# Patient Record
Sex: Male | Born: 1955 | Race: White | Hispanic: No | Smoking: Current every day smoker
Health system: Southern US, Community
[De-identification: ages and names within clinical notes are randomized; demographics above are authoritative.]

## PROBLEM LIST (undated history)

## (undated) DIAGNOSIS — F32A Depression, unspecified: Secondary | ICD-10-CM

## (undated) DIAGNOSIS — K5790 Diverticulosis of intestine, part unspecified, without perforation or abscess without bleeding: Secondary | ICD-10-CM

## (undated) DIAGNOSIS — I10 Essential (primary) hypertension: Secondary | ICD-10-CM

## (undated) DIAGNOSIS — Z955 Presence of coronary angioplasty implant and graft: Secondary | ICD-10-CM

## (undated) DIAGNOSIS — I219 Acute myocardial infarction, unspecified: Secondary | ICD-10-CM

## (undated) DIAGNOSIS — E785 Hyperlipidemia, unspecified: Secondary | ICD-10-CM

## (undated) DIAGNOSIS — J189 Pneumonia, unspecified organism: Secondary | ICD-10-CM

## (undated) DIAGNOSIS — I739 Peripheral vascular disease, unspecified: Secondary | ICD-10-CM

## (undated) DIAGNOSIS — K2270 Barrett's esophagus without dysplasia: Secondary | ICD-10-CM

## (undated) DIAGNOSIS — M1990 Unspecified osteoarthritis, unspecified site: Secondary | ICD-10-CM

## (undated) DIAGNOSIS — I2510 Atherosclerotic heart disease of native coronary artery without angina pectoris: Secondary | ICD-10-CM

## (undated) DIAGNOSIS — F3181 Bipolar II disorder: Secondary | ICD-10-CM

## (undated) DIAGNOSIS — I2119 ST elevation (STEMI) myocardial infarction involving other coronary artery of inferior wall: Principal | ICD-10-CM

## (undated) DIAGNOSIS — I509 Heart failure, unspecified: Secondary | ICD-10-CM

## (undated) DIAGNOSIS — F329 Major depressive disorder, single episode, unspecified: Secondary | ICD-10-CM

## (undated) DIAGNOSIS — F1010 Alcohol abuse, uncomplicated: Secondary | ICD-10-CM

## (undated) DIAGNOSIS — F419 Anxiety disorder, unspecified: Secondary | ICD-10-CM

## (undated) DIAGNOSIS — K219 Gastro-esophageal reflux disease without esophagitis: Secondary | ICD-10-CM

## (undated) HISTORY — DX: Diverticulosis of intestine, part unspecified, without perforation or abscess without bleeding: K57.90

## (undated) HISTORY — DX: Essential (primary) hypertension: I10

## (undated) HISTORY — DX: Major depressive disorder, single episode, unspecified: F32.9

## (undated) HISTORY — DX: Gastro-esophageal reflux disease without esophagitis: K21.9

## (undated) HISTORY — PX: CORONARY STENT PLACEMENT: SHX1402

## (undated) HISTORY — DX: Hyperlipidemia, unspecified: E78.5

## (undated) HISTORY — PX: CORONARY ANGIOPLASTY: SHX604

## (undated) HISTORY — DX: Anxiety disorder, unspecified: F41.9

## (undated) HISTORY — DX: Heart failure, unspecified: I50.9

## (undated) HISTORY — DX: Pneumonia, unspecified organism: J18.9

## (undated) HISTORY — DX: Alcohol abuse, uncomplicated: F10.10

## (undated) HISTORY — DX: Barrett's esophagus without dysplasia: K22.70

## (undated) HISTORY — DX: Depression, unspecified: F32.A

## (undated) HISTORY — PX: FEMORAL-POPLITEAL BYPASS GRAFT: SHX937

## (undated) HISTORY — PX: CORONARY ARTERY BYPASS GRAFT: SHX141

## (undated) HISTORY — DX: Atherosclerotic heart disease of native coronary artery without angina pectoris: I25.10

## (undated) HISTORY — DX: Unspecified osteoarthritis, unspecified site: M19.90

## (undated) HISTORY — PX: CHOLECYSTECTOMY: SHX55

## (undated) HISTORY — PX: TOE SURGERY: SHX1073

---

## 1998-10-04 ENCOUNTER — Encounter: Payer: Self-pay | Admitting: Rheumatology

## 1998-10-04 ENCOUNTER — Ambulatory Visit (HOSPITAL_COMMUNITY): Admission: RE | Admit: 1998-10-04 | Discharge: 1998-10-04 | Payer: Self-pay | Admitting: Rheumatology

## 2000-04-08 ENCOUNTER — Encounter: Payer: Self-pay | Admitting: Gastroenterology

## 2000-04-08 ENCOUNTER — Ambulatory Visit (HOSPITAL_COMMUNITY): Admission: RE | Admit: 2000-04-08 | Discharge: 2000-04-08 | Payer: Self-pay | Admitting: Gastroenterology

## 2000-05-01 ENCOUNTER — Ambulatory Visit (HOSPITAL_COMMUNITY): Admission: RE | Admit: 2000-05-01 | Discharge: 2000-05-01 | Payer: Self-pay | Admitting: Gastroenterology

## 2000-11-11 ENCOUNTER — Encounter: Payer: Self-pay | Admitting: Gastroenterology

## 2000-11-11 ENCOUNTER — Encounter: Admission: RE | Admit: 2000-11-11 | Discharge: 2000-11-11 | Payer: Self-pay | Admitting: Gastroenterology

## 2000-11-18 ENCOUNTER — Ambulatory Visit (HOSPITAL_COMMUNITY): Admission: RE | Admit: 2000-11-18 | Discharge: 2000-11-18 | Payer: Self-pay | Admitting: Gastroenterology

## 2003-01-01 ENCOUNTER — Inpatient Hospital Stay (HOSPITAL_COMMUNITY): Admission: EM | Admit: 2003-01-01 | Discharge: 2003-01-05 | Payer: Self-pay | Admitting: Emergency Medicine

## 2003-01-02 ENCOUNTER — Encounter: Payer: Self-pay | Admitting: Internal Medicine

## 2003-01-03 ENCOUNTER — Encounter: Payer: Self-pay | Admitting: Internal Medicine

## 2003-03-10 ENCOUNTER — Encounter: Admission: RE | Admit: 2003-03-10 | Discharge: 2003-03-10 | Payer: Self-pay | Admitting: Family Medicine

## 2003-03-10 ENCOUNTER — Encounter: Payer: Self-pay | Admitting: Family Medicine

## 2004-07-22 ENCOUNTER — Emergency Department (HOSPITAL_COMMUNITY): Admission: EM | Admit: 2004-07-22 | Discharge: 2004-07-22 | Payer: Self-pay | Admitting: *Deleted

## 2004-08-16 ENCOUNTER — Encounter: Admission: RE | Admit: 2004-08-16 | Discharge: 2004-08-16 | Payer: Self-pay | Admitting: Family Medicine

## 2004-08-16 ENCOUNTER — Encounter: Payer: Self-pay | Admitting: Gastroenterology

## 2004-08-16 ENCOUNTER — Ambulatory Visit (HOSPITAL_COMMUNITY): Admission: RE | Admit: 2004-08-16 | Discharge: 2004-08-16 | Payer: Self-pay | Admitting: Dentistry

## 2004-08-24 ENCOUNTER — Ambulatory Visit (HOSPITAL_COMMUNITY): Admission: RE | Admit: 2004-08-24 | Discharge: 2004-08-24 | Payer: Self-pay | Admitting: Orthopaedic Surgery

## 2004-09-14 ENCOUNTER — Encounter: Admission: RE | Admit: 2004-09-14 | Discharge: 2004-11-09 | Payer: Self-pay | Admitting: Orthopaedic Surgery

## 2004-09-27 ENCOUNTER — Ambulatory Visit (HOSPITAL_COMMUNITY): Admission: RE | Admit: 2004-09-27 | Discharge: 2004-09-27 | Payer: Self-pay | Admitting: Gastroenterology

## 2004-09-27 ENCOUNTER — Encounter: Payer: Self-pay | Admitting: Gastroenterology

## 2004-10-24 ENCOUNTER — Encounter
Admission: RE | Admit: 2004-10-24 | Discharge: 2004-12-26 | Payer: Self-pay | Admitting: Physical Medicine and Rehabilitation

## 2004-10-26 ENCOUNTER — Ambulatory Visit: Payer: Self-pay | Admitting: Physical Medicine and Rehabilitation

## 2004-11-08 ENCOUNTER — Ambulatory Visit: Payer: Self-pay | Admitting: Sports Medicine

## 2004-11-21 ENCOUNTER — Ambulatory Visit: Payer: Self-pay | Admitting: Family Medicine

## 2004-12-01 ENCOUNTER — Observation Stay (HOSPITAL_COMMUNITY): Admission: RE | Admit: 2004-12-01 | Discharge: 2004-12-02 | Payer: Self-pay | Admitting: Orthopaedic Surgery

## 2005-01-02 ENCOUNTER — Ambulatory Visit: Payer: Self-pay | Admitting: Family Medicine

## 2005-01-18 ENCOUNTER — Encounter: Admission: RE | Admit: 2005-01-18 | Discharge: 2005-02-20 | Payer: Self-pay | Admitting: Orthopaedic Surgery

## 2005-01-22 ENCOUNTER — Ambulatory Visit: Payer: Self-pay

## 2005-01-26 ENCOUNTER — Ambulatory Visit: Payer: Self-pay | Admitting: Family Medicine

## 2005-01-30 ENCOUNTER — Ambulatory Visit: Payer: Self-pay | Admitting: Family Medicine

## 2005-02-07 ENCOUNTER — Emergency Department (HOSPITAL_COMMUNITY): Admission: EM | Admit: 2005-02-07 | Discharge: 2005-02-08 | Payer: Self-pay | Admitting: Emergency Medicine

## 2005-02-16 ENCOUNTER — Ambulatory Visit (HOSPITAL_COMMUNITY): Admission: RE | Admit: 2005-02-16 | Discharge: 2005-02-16 | Payer: Self-pay | Admitting: Orthopaedic Surgery

## 2005-02-18 ENCOUNTER — Emergency Department (HOSPITAL_COMMUNITY): Admission: EM | Admit: 2005-02-18 | Discharge: 2005-02-18 | Payer: Self-pay | Admitting: Emergency Medicine

## 2005-02-20 ENCOUNTER — Ambulatory Visit: Payer: Self-pay | Admitting: Family Medicine

## 2005-03-21 ENCOUNTER — Ambulatory Visit (HOSPITAL_BASED_OUTPATIENT_CLINIC_OR_DEPARTMENT_OTHER): Admission: RE | Admit: 2005-03-21 | Discharge: 2005-03-21 | Payer: Self-pay | Admitting: Orthopaedic Surgery

## 2005-05-04 ENCOUNTER — Ambulatory Visit: Payer: Self-pay | Admitting: Family Medicine

## 2005-05-18 ENCOUNTER — Ambulatory Visit: Payer: Self-pay | Admitting: Family Medicine

## 2007-02-20 DIAGNOSIS — E785 Hyperlipidemia, unspecified: Secondary | ICD-10-CM

## 2007-02-20 DIAGNOSIS — B1710 Acute hepatitis C without hepatic coma: Secondary | ICD-10-CM

## 2007-02-20 DIAGNOSIS — F17200 Nicotine dependence, unspecified, uncomplicated: Secondary | ICD-10-CM

## 2007-02-20 DIAGNOSIS — K449 Diaphragmatic hernia without obstruction or gangrene: Secondary | ICD-10-CM

## 2007-02-20 DIAGNOSIS — M129 Arthropathy, unspecified: Secondary | ICD-10-CM | POA: Insufficient documentation

## 2007-02-20 DIAGNOSIS — K922 Gastrointestinal hemorrhage, unspecified: Secondary | ICD-10-CM | POA: Insufficient documentation

## 2007-02-20 DIAGNOSIS — M109 Gout, unspecified: Secondary | ICD-10-CM

## 2008-04-06 ENCOUNTER — Inpatient Hospital Stay (HOSPITAL_COMMUNITY): Admission: EM | Admit: 2008-04-06 | Discharge: 2008-04-17 | Payer: Self-pay | Admitting: Emergency Medicine

## 2008-04-07 ENCOUNTER — Ambulatory Visit: Payer: Self-pay | Admitting: Cardiothoracic Surgery

## 2008-04-07 ENCOUNTER — Encounter: Payer: Self-pay | Admitting: Cardiothoracic Surgery

## 2008-05-04 ENCOUNTER — Encounter: Admission: RE | Admit: 2008-05-04 | Discharge: 2008-05-04 | Payer: Self-pay | Admitting: Cardiothoracic Surgery

## 2008-05-04 ENCOUNTER — Ambulatory Visit: Payer: Self-pay | Admitting: Cardiothoracic Surgery

## 2008-05-24 ENCOUNTER — Ambulatory Visit: Payer: Self-pay | Admitting: Cardiothoracic Surgery

## 2008-07-08 ENCOUNTER — Encounter (HOSPITAL_COMMUNITY): Admission: RE | Admit: 2008-07-08 | Discharge: 2008-09-16 | Payer: Self-pay | Admitting: Cardiology

## 2008-10-01 ENCOUNTER — Observation Stay (HOSPITAL_COMMUNITY): Admission: EM | Admit: 2008-10-01 | Discharge: 2008-10-02 | Payer: Self-pay | Admitting: Emergency Medicine

## 2009-06-06 ENCOUNTER — Emergency Department (HOSPITAL_COMMUNITY): Admission: EM | Admit: 2009-06-06 | Discharge: 2009-06-06 | Payer: Self-pay | Admitting: Emergency Medicine

## 2009-06-16 ENCOUNTER — Emergency Department (HOSPITAL_COMMUNITY): Admission: EM | Admit: 2009-06-16 | Discharge: 2009-06-16 | Payer: Self-pay | Admitting: Emergency Medicine

## 2009-09-09 ENCOUNTER — Emergency Department (HOSPITAL_COMMUNITY): Admission: EM | Admit: 2009-09-09 | Discharge: 2009-09-09 | Payer: Self-pay | Admitting: Emergency Medicine

## 2009-09-20 ENCOUNTER — Ambulatory Visit: Payer: Self-pay | Admitting: Psychiatry

## 2009-09-20 ENCOUNTER — Inpatient Hospital Stay (HOSPITAL_COMMUNITY): Admission: AD | Admit: 2009-09-20 | Discharge: 2009-09-26 | Payer: Self-pay | Admitting: Psychiatry

## 2009-09-20 ENCOUNTER — Emergency Department (HOSPITAL_COMMUNITY): Admission: EM | Admit: 2009-09-20 | Discharge: 2009-09-20 | Payer: Self-pay | Admitting: Emergency Medicine

## 2009-12-13 IMAGING — CR DG CHEST 1V PORT
1 series · 1 of 1 positions shown · non-contrast
Comparison: 04/08/2008 [DATE] p.m..

CLINICAL DATA: Status post CABG.

PORTABLE CHEST - 1 VIEW

[AP]
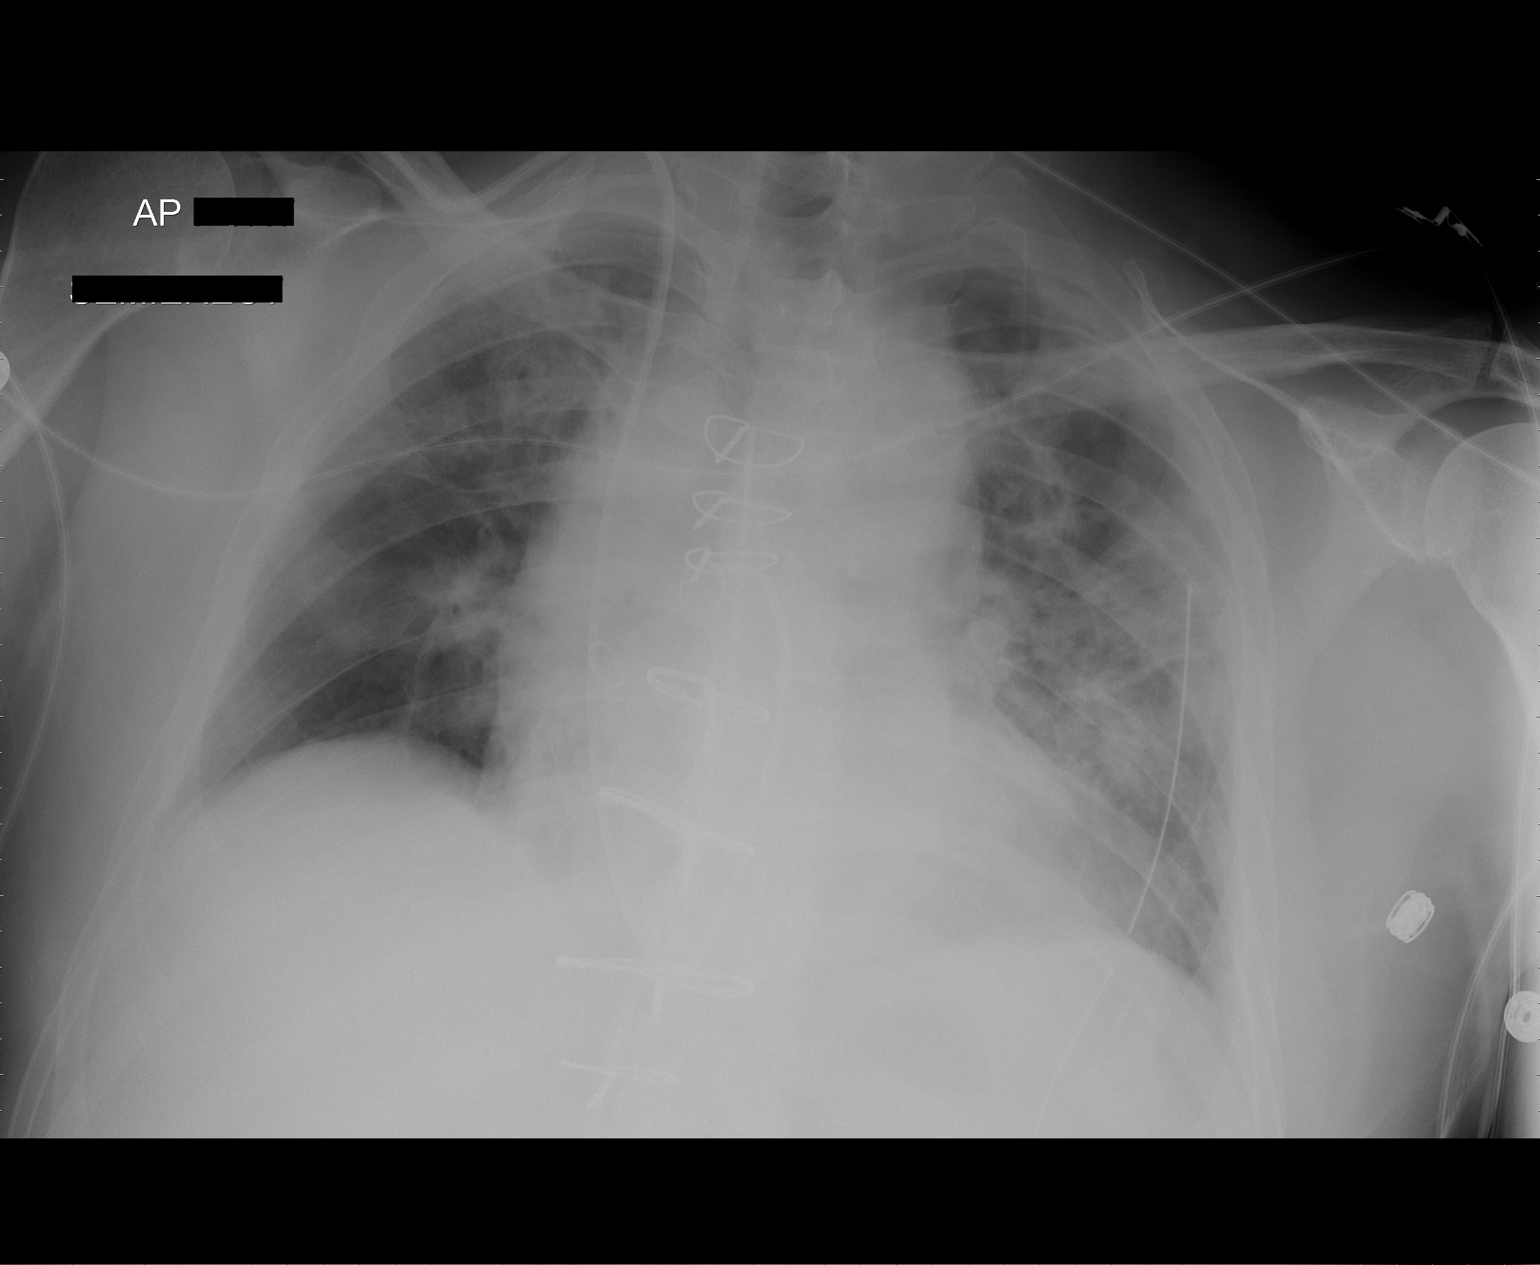

[1 of 1 positions shown; findings below may reference images not displayed]

FINDINGS: Endotracheal tube and nasogastric tube have been removed.
Swan-Ganz catheter has been retracted with the tip at the level of
the main pulmonary artery - right main pulmonary artery junction.
Single left-sided chest tube remains in place.  The possibility of
an anteriorly located left-sided pneumothorax was previously
raised, it is possible this remains although atelectasis may
contribute to this appearance.  Interval development of patchy
changes peripheral aspect left mid lung extending towards the left
hilum.  This may represent atelectasis although infiltrate cannot
be excluded..  Pulmonary vascular congestion.  Cardiomegaly.
Torturuous aorta.
IMPRESSION: Endotracheal tube and nasogastric tube have been removed with
repositioning of Swan-Ganz catheter.

Interval development of patchy consolidation left midlung zone.
This may represent atelectasis and / or infiltrate.

The previously questioned anterior left pneumothorax may remain.
Attention to this region on follow-up.

## 2010-03-28 ENCOUNTER — Inpatient Hospital Stay (HOSPITAL_COMMUNITY): Admission: EM | Admit: 2010-03-28 | Discharge: 2010-03-30 | Payer: Self-pay | Admitting: Emergency Medicine

## 2011-03-14 LAB — CBC
Hemoglobin: 14.1 g/dL (ref 13.0–17.0)
MCHC: 33.7 g/dL (ref 30.0–36.0)
MCHC: 34.6 g/dL (ref 30.0–36.0)
MCV: 86.2 fL (ref 78.0–100.0)
MCV: 87.8 fL (ref 78.0–100.0)
RBC: 3.85 MIL/uL — ABNORMAL LOW (ref 4.22–5.81)
RBC: 4.72 MIL/uL (ref 4.22–5.81)
RDW: 16.9 % — ABNORMAL HIGH (ref 11.5–15.5)
WBC: 9.7 10*3/uL (ref 4.0–10.5)

## 2011-03-14 LAB — URINALYSIS, ROUTINE W REFLEX MICROSCOPIC
Bilirubin Urine: NEGATIVE
Ketones, ur: NEGATIVE mg/dL
Nitrite: NEGATIVE
Protein, ur: NEGATIVE mg/dL
Urobilinogen, UA: 0.2 mg/dL (ref 0.0–1.0)

## 2011-03-14 LAB — COMPREHENSIVE METABOLIC PANEL
ALT: 14 U/L (ref 0–53)
ALT: 18 U/L (ref 0–53)
AST: 14 U/L (ref 0–37)
AST: 18 U/L (ref 0–37)
CO2: 22 mEq/L (ref 19–32)
CO2: 27 mEq/L (ref 19–32)
Calcium: 8.3 mg/dL — ABNORMAL LOW (ref 8.4–10.5)
Calcium: 9.1 mg/dL (ref 8.4–10.5)
Chloride: 103 mEq/L (ref 96–112)
Creatinine, Ser: 0.82 mg/dL (ref 0.4–1.5)
GFR calc Af Amer: >60 mL/min (ref >60)
GFR calc non Af Amer: >60 mL/min (ref >60)
GFR calc non Af Amer: >60 mL/min (ref >60)
Glucose, Bld: 136 mg/dL — ABNORMAL HIGH (ref 70–99)
Sodium: 137 mEq/L (ref 135–145)
Sodium: 139 mEq/L (ref 135–145)
Total Bilirubin: 0.8 mg/dL (ref 0.3–1.2)
Total Protein: 5.9 g/dL — ABNORMAL LOW (ref 6.0–8.3)

## 2011-03-14 LAB — PROTIME-INR: INR: 1.06 (ref 0.00–1.49)

## 2011-03-14 LAB — DIFFERENTIAL
Basophils Absolute: 0 10*3/uL (ref 0.0–0.1)
Basophils Relative: 0 % (ref 0–1)
Eosinophils Absolute: 0.1 10*3/uL (ref 0.0–0.7)
Eosinophils Relative: 1 % (ref 0–5)
Lymphs Abs: 1.3 10*3/uL (ref 0.7–4.0)
Neutrophils Relative %: 81 % — ABNORMAL HIGH (ref 43–77)

## 2011-03-14 LAB — LIPASE, BLOOD
Lipase: 21 U/L (ref 11–59)
Lipase: 24 U/L (ref 11–59)

## 2011-03-14 LAB — GLUCOSE, CAPILLARY: Glucose-Capillary: 102 mg/dL — ABNORMAL HIGH (ref 70–99)

## 2011-03-14 LAB — APTT: aPTT: 31 seconds (ref 24–37)

## 2011-03-30 LAB — URINALYSIS, ROUTINE W REFLEX MICROSCOPIC
Protein, ur: NEGATIVE mg/dL
Specific Gravity, Urine: 1.014 (ref 1.005–1.030)
Urobilinogen, UA: 0.2 mg/dL (ref 0.0–1.0)

## 2011-03-30 LAB — CBC
Platelets: 174 10*3/uL (ref 150–400)
RBC: 4.49 MIL/uL (ref 4.22–5.81)
WBC: 9.6 10*3/uL (ref 4.0–10.5)

## 2011-03-30 LAB — DIFFERENTIAL
Eosinophils Absolute: 0 10*3/uL (ref 0.0–0.7)
Lymphocytes Relative: 8 % — ABNORMAL LOW (ref 12–46)
Lymphs Abs: 0.8 10*3/uL (ref 0.7–4.0)
Neutro Abs: 8.5 10*3/uL — ABNORMAL HIGH (ref 1.7–7.7)
Neutrophils Relative %: 89 % — ABNORMAL HIGH (ref 43–77)

## 2011-03-30 LAB — BASIC METABOLIC PANEL
BUN: 12 mg/dL (ref 6–23)
Calcium: 9 mg/dL (ref 8.4–10.5)
Creatinine, Ser: 0.65 mg/dL (ref 0.4–1.5)
GFR calc Af Amer: >60 mL/min (ref >60)
GFR calc non Af Amer: >60 mL/min (ref >60)

## 2011-03-30 LAB — ETHANOL: Alcohol, Ethyl (B): <5 mg/dL (ref 0–10)

## 2011-03-30 LAB — RAPID URINE DRUG SCREEN, HOSP PERFORMED
Amphetamines: NOT DETECTED
Barbiturates: NOT DETECTED
Tetrahydrocannabinol: POSITIVE — AB

## 2011-03-30 LAB — HEPATIC FUNCTION PANEL
AST: 16 U/L (ref 0–37)
Albumin: 3.3 g/dL — ABNORMAL LOW (ref 3.5–5.2)
Alkaline Phosphatase: 81 U/L (ref 39–117)
Total Protein: 5.9 g/dL — ABNORMAL LOW (ref 6.0–8.3)

## 2011-04-02 LAB — CBC
HCT: 33.8 % — ABNORMAL LOW (ref 39.0–52.0)
HCT: 38.6 % — ABNORMAL LOW (ref 39.0–52.0)
Hemoglobin: 11.5 g/dL — ABNORMAL LOW (ref 13.0–17.0)
Hemoglobin: 13.2 g/dL (ref 13.0–17.0)
MCV: 84.6 fL (ref 78.0–100.0)
Platelets: 166 10*3/uL (ref 150–400)
RBC: 4.57 MIL/uL (ref 4.22–5.81)
RDW: 16.2 % — ABNORMAL HIGH (ref 11.5–15.5)
WBC: 11.4 10*3/uL — ABNORMAL HIGH (ref 4.0–10.5)

## 2011-04-02 LAB — DIFFERENTIAL
Eosinophils Absolute: 0.1 10*3/uL (ref 0.0–0.7)
Eosinophils Relative: 1 % (ref 0–5)
Eosinophils Relative: 1 % (ref 0–5)
Lymphocytes Relative: 18 % (ref 12–46)
Lymphocytes Relative: 8 % — ABNORMAL LOW (ref 12–46)
Lymphs Abs: 1.4 10*3/uL (ref 0.7–4.0)
Lymphs Abs: 0.9 10*3/uL (ref 0.7–4.0)
Monocytes Absolute: 0.6 10*3/uL (ref 0.1–1.0)
Monocytes Absolute: 0.5 10*3/uL (ref 0.1–1.0)
Monocytes Relative: 4 % (ref 3–12)

## 2011-04-02 LAB — COMPREHENSIVE METABOLIC PANEL
ALT: 15 U/L (ref 0–53)
AST: 16 U/L (ref 0–37)
Albumin: 3.5 g/dL (ref 3.5–5.2)
CO2: 23 mEq/L (ref 19–32)
Calcium: 8.6 mg/dL (ref 8.4–10.5)
Chloride: 114 mEq/L — ABNORMAL HIGH (ref 96–112)
Creatinine, Ser: 0.78 mg/dL (ref 0.4–1.5)
GFR calc Af Amer: >60 mL/min (ref >60)
Sodium: 146 mEq/L — ABNORMAL HIGH (ref 135–145)
Total Bilirubin: 0.4 mg/dL (ref 0.3–1.2)

## 2011-04-02 LAB — ETHANOL: Alcohol, Ethyl (B): 17 mg/dL — ABNORMAL HIGH (ref 0–10)

## 2011-04-02 LAB — BASIC METABOLIC PANEL
Chloride: 105 mEq/L (ref 96–112)
GFR calc Af Amer: >60 mL/min (ref >60)
GFR calc non Af Amer: >60 mL/min (ref >60)
Potassium: 4.3 mEq/L (ref 3.5–5.1)
Sodium: 137 mEq/L (ref 135–145)

## 2011-04-02 LAB — POCT CARDIAC MARKERS
CKMB, poc: 1.5 ng/mL (ref 1.0–8.0)
Troponin i, poc: <0.05 ng/mL (ref 0.00–0.09)

## 2011-05-08 NOTE — Discharge Summary (Signed)
[name], [name]                 ACCOUNT NO.:  [id number]   MEDICAL RECORD NO.:  [id number]           PATIENT TYPE:   LOCATION:                                 FACILITY:   PHYSICIAN:  Sheliah Plane, MD    DATE OF BIRTH:  1956-06-05   DATE OF ADMISSION:  DATE OF DISCHARGE:                               DISCHARGE SUMMARY   FINAL DIAGNOSES:  1. Coronary occlusive disease with unstable angina.  2. Enzyme positive subendocardial and myocardial infarction.   IN-HOSPITAL DIAGNOSES:  1. Acute blood loss anemia postoperatively.  2. Volume overload postoperatively.  3. Postoperative respiratory failure/acute respiratory distress      syndrome.   SECONDARY DIAGNOSES:  1. History of progressive peripheral vascular disease with limiting      claudication to approximately 50 feet.  2. Status post rotator cuff x2.  3. History of gout.  4. Extensive debridement of shoulder.  5. Gastroesophageal reflux disease.   IN-HOSPITAL OPERATIONS AND PROCEDURES:  1. Cardiac catheterization.  2. Coronary artery bypass grafting x4 using a left internal mammary to      left anterior descending coronary artery, left radial artery to      first obtuse marginal, reverse saphenous vein graft to distal      circumflex, reverse saphenous vein graft to totally occluded      posterior descending coronary artery.  3. Open left radial artery harvest done.  4. Right endovein harvest of the thigh done.   HISTORY AND PHYSICAL AND HOSPITAL COURSE:  The patient is a 55 year old  male who presents with acute substernal chest pain radiating to the arm  with positive cardiac enzymes.  He was stabilized medically and  underwent cardiac catheterization by Dr. Allyson Sabal.  This revealed an acute  total occlusion of the distal right coronary artery with faint  collateral filling and the posterior descending from the left system.  In addition, he had 80%-90% left anterior descending obstruction and  large first obtuse  marginal with 80% stenosis.  Overall, left  ventricular function was depressed with evidence of akinesis in the  inferior wall.  Coronary artery bypass grafting was recommended.  The  patient was seen and evaluated by Dr. Tyrone Sage.  Dr. Tyrone Sage saw and  evaluated the patient.  Discussed with the patient undergoing coronary  artery bypass grafting.  He discussed risks and benefits with the  patient.  The patient acknowledged understanding and agreed to proceed.  Surgery was scheduled for April 08, 2008.  Preoperatively, the patient  did have a bilateral carotid duplex ultrasound done showing no evidence  of ICA stenosis.  He also had preoperative ABIs done showing on the  right to be 0.54 and on the left to be 0.63.  The patient remained  stable preoperatively.  For details of the patient's past medical  history and physical exam, please see dictated H and P.   The patient was taken to the operating room on April 08, 2008, where he  underwent coronary artery bypass grafting x4 using a left internal  mammary artery to left anterior descending coronary artery, left  radial  artery to first obtuse marginal, reverse saphenous vein graft to distal  circumflex, reverse saphenous vein graft totally occluded posterior  descending coronary artery.  Right endovein harvesting was done as well  as left open radial artery harvest.  The patient tolerated this  procedure well and transferred to the intensive care unit in stable  condition.  Postoperatively, the patient was noted to be hemodynamically  stable.  He was extubated the evening of surgery.  Post extubation, the  patient was noted to be alert and oriented x4 and neuro intact.  He was  extubated 6 L of nasal cannula.  Postop day #1, chest x-ray done showed  bilateral congestion.  The patient was having decreased O2 sats and was  placed on BiPAP.  O2 sat stabilized.  Repeat chest x-ray done on postop  day #2 showed ARDS pattern.  The patient  remained on BiPAP, was working  in pulmonary toilet.  Over the next several days, this was improving and  the patient was able to be weaned off BiPAP and placed back on nasal  cannula.  He continued to use incentive spirometer.  The patient was  able to be weaned off oxygen, satting greater than 90% on room air.  The  patient's chest tubes were removed in the normal fashion.  Postoperatively, the patient's heart rate was noted to be in normal  sinus rhythm.  He was able to be weaned off all drips.  Swan-Ganz  catheter DC in normal fashion.  The patient's heart rate and blood  pressure remained stable and he was started on low-dose beta-blocker as  well as ACE inhibitor.  The patient has tolerated this well.  Prior to  discharge home, the patient was noted to be in normal sinus rhythm.  Postoperatively, the patient did develop acute blood loss anemia.  His  hemoglobin and hematocrit dropped to 7.5 and 21.7 on postop day #7.  One  unit packed red blood cells was ordered.  The patient was also started  on p.o. iron.  Repeat hemoglobin and hematocrit improved appropriately  following transfusion.  Postop day was 8.5 and 23.9.  Plan to recheck H  and H in the a.m. prior to discharge.  Postoperatively, the patient also  had some volume overload.  He required diuretics.  Daily weights were  obtained.  These were improved prior to discharge.  Cardiac rehab was  working with the patient postoperatively.  He was out of bed and  ambulating well without difficulty.  The patient was progressing well  prior to discharge home.  Postoperatively, the patient was tolerating  diet well.  No nausea or vomiting noted.   Postop day #8, April 16, 2008, the patient was found to be afebrile.  Heart rate and blood pressure stable.  He was satting greater than 90%  on room air.  Labs showed a white count 7.6, hemoglobin of 8.4,  hematocrit 23.9, and platelet count 281.  Sodium of 136, potassium 4.1,  chloride of  106, bicarb of 24, BUN of 15, creatinine 0.85, and glucose  of 107.  The patient was noted to be in normal sinus rhythm.  Pulmonary  status stable.  All incisions were clean, dry, and intact and healing  well.  The patient is tentatively ready for discharge home in the a.m.,  pending.  He remained stable.   FOLLOW-UP APPOINTMENTS:  Follow-up appointment has been arranged with  Dr. Tyrone Sage for May 06, 2008 3:00 p.m.  The patient will  need to obtain  PMI chest x-ray 30 minutes prior to this appointment.  The patient will  need to follow up Dr. Allyson Sabal in 2 weeks.  He will need to contact Dr.  Hazle Coca office to make these arrangements.   ACTIVITY:  The patient is instructed to not driving to released to do  so, no lifting over 10 pounds.  The patient is told to ambulate 3-4  times per day, progress as tolerating, and to begin breathing exercises.   INCISIONAL CARE:  The patient is told to shower, washing his incisions  using soap and water.  He is to contact the office if he develops any  drainage or opening from any of these incision sites.   DIET:  The patient's diet to be low fat and low salt.   DISCHARGE MEDICATIONS:  1. Colchicine 0.6 mg daily.  2. Zocor 40 mg at night.  3. Imdur 30 mg daily.  4. Guaifenesin 600 mg b.i.d.  5. Aspirin 325 mg daily.  6. Lopressor 25 mg b.i.d.  7. Folic acid 1 mg daily.  8. Altace 2.5 mg at night.  9. Oxycodone 5 mg 1-2 tablets q.4 h. p.r.n.  10.Gemfibrozil 600 mg b.i.d.  11.Prilosec 20 mg daily.  12.Allopurinol 450 mg daily.  13.Nortriptyline 150 mg at night.  14.Calcium supplement daily.      [name], [place]      Sheliah Plane, MD  Electronically Signed    KMD/MEDQ  D:  04/16/2008  T:  04/17/2008  Job:  [id number]   cc:   Nanetta Batty, M.D.

## 2011-05-08 NOTE — Cardiovascular Report (Signed)
NAME:  [name], RECORE NO.:  [id number]   MEDICAL RECORD NO.:  [id number]          PATIENT TYPE:  INP   LOCATION:  2908                         FACILITY:  [facility]   PHYSICIAN:  Nanetta Batty, M.D.   DATE OF BIRTH:  02/01/1956   DATE OF PROCEDURE:  DATE OF DISCHARGE:                            CARDIAC CATHETERIZATION   [name] is a 55 year old single white male who lives with his parents.  He has a history of tobacco abuse, hyperlipidemia and post-traumatic  stress syndrome.  He was admitted on April 06, 2008, with chest pain and  nonspecific ST and T-wave changes.  He had a non ST-segment elevation MI  with positive enzymes.  He was pain free on IV heparin and nitro, as  well as Integrilin.  He presents now for diagnostic coronary  arteriography to define his anatomy and rule out ischemic etiology.   DESCRIPTION OF PROCEDURE:  The patient was brought to second floor [facility] in the postabsorptive state.  He was premedicated  with p.o. Valium.  His right groin was prepped and shaved in the usual  sterile fashion.  1% Xylocaine was used for local anesthesia.  A 6-  French sheath was inserted into the right femoral artery using standard  Seldinger technique.  A 6-French right and left Judkins diagnostic  catheters along with 6-French pigtail catheter were used for selective  coronary angiography, left ventriculography, selective right and left  internal mammary artery angiography and distal abdominal aortography.  Visipaque dye was used for the entirety of the case.  Retrograde aortic,  left ventricular blood pressures were recorded.   HEMODYNAMICS:  1. Aortic systolic pressure 111, diastolic 64.  2. Left ventricular systolic pressure 111, end-diastolic pressure 16.  3. Selective coronary angiography      a.     Left main normal.      b.     LAD; LAD wrapped at the apex.  It gave off two proximal       diagonal branches.  There was a 90%  fairly focal LAD lesion at the       takeoff of the second diagonal branch.      c.     Circumflex; this was a nondominant vessel.  There was 50%       proximal hypodense stenosis.  The circumflex then gave off a       moderate-sized first marginal branch which had a 75-80% hypodense       stenosis beginning at its origin proximally.  The continuation of       the AV groove circumflex had a long 80-90% stenosis.      d.     Right coronary artery; dominant vessel occluded at the genu       of the vessel.  There were grade 2 to 3 left-to-right collaterals       noted. V.  Left ventriculography; RAO left ventriculogram was       performed using 25 mL of Visipaque dye at 12 mL per second.  The  overall LVEF was estimated at approximately 40% with mild-to-       moderate global hypokinesia and moderate inferobasal hypokinesia.       Right and left internal mammary arteries; these vessels were       subselectively visualized and were widely patent.  They are both       suitable for coronary artery bypass grafting if necessary.  Distal       abdominal aortography; distal abdominal aortogram was performed       using 20 mL of Visipaque dye at 20 mL per second.  There was an       80% proximal right renal artery stenosis.  The infrarenal       abdominal aorta and iliac bifurcation were free of significant       atherosclerotic changes.   IMPRESSION:  [name] has three-vessel disease with an occluded right  which is probably his infarct-related vessel, left-to-right collaterals  and high-grade left anterior descending and circumflex disease.  He has  moderate left ventricular dysfunction.  Both internal mammary arteries  were patent.  He is a good candidate for coronary artery  bypass grafting using bilateral I &Es and possibly radial artery  conduit.  An ACT was measured at 243.  A sheath was then secured in  place.  The patient left the lab in stable condition.  Sheaths will be  removed  once the ACT falls below 170.  Heparin will be restarted in 4  hours.  We will continue Integrilin.  TCTF has been consulted.      Nanetta Batty, M.D.  Electronically Signed     JB/MEDQ  D:  04/07/2008  T:  04/08/2008  Job:  [id number]   cc:   [facility]  [facility]  Second Floor [facility] Cardiac Cath Lab

## 2011-05-08 NOTE — Assessment & Plan Note (Signed)
OFFICE VISIT   [name], [name]  DOB:  1956/02/28                                        May 24, 2008  CHART #:  [id number]   HISTORY:  [name] returns for his second postoperative visit following  his coronary surgical revascularization.  He underwent a coronary artery  bypass grafting x4 with a left internal mammary artery to the LAD, a  left radial artery to the first obtuse marginal, and a saphenous vein  graft to the distal circumflex and a saphenous vein graft to the  posterior descending.  He continues to feel improved with time.  He has  ongoing chronic disabilities related to gout with fits, confounding  issues in his joints, limiting his mobility.  Additionally, he has some  peripheral arterial disease with limiting claudication.  He continues to  smoke, although he admits that he has significantly reduced it to  approximately five cigarettes per day.  His primary complaint on today's  date is left radial artery harvest site incision which is spitting  suture material from the wound.   PHYSICAL EXAMINATION:  VITAL SIGNS:  Blood pressure 115/79, pulse 94,  respirations 18, oxygen saturation is 97% on room air.  GENERAL:  This is a well-developed adult male in no acute distress.  PULMONARY EXAMINATION:  Reveals clear breath sounds throughout.  CARDIAC EXAMINATION:  Regular rate and rhythm without murmurs, gallops  or rubs.  ABDOMEN:  Soft.  EXTREMITY EXAMINATION:  No edema incisions.  His left radial artery harvest site has some evidence of Vicryl material  in the skin edges.  There is minimal associated erythema.  There is no  obvious purulence or cellulitis.  There is no lymphangitis associated  with this.  The hand itself has no pain or numbness.   BRIEF PROCEDURE NOTE:  After Betadine prep, I debrided small amounts of  Vicryl suture material from the wound.  He tolerated this well.  I  redressed the incisions with bacitracin and Band-Aids.   ASSESSMENT:  Overall, [name] is making adequate progress in his  recovery from his coronary surgical revascularization.  I spent several  minutes on smoking cessation discussion and its advantages in his long-  term health outlook.  He understands this.  He is going to continue to  attempt to quit, and currently, he is using the patch.  I also discussed  his other comorbidities and his long-term need for reducing his risk  factors and improving his overall health status by general practices of  healthy diet and exercise.  He is to continue to follow up with his  cardiologist and family medical care that he receives at the [place] in a  routine manner.  From the surgical viewpoint, we can see him again on a  p.r.n. basis if he has continued difficulties with this or any other  surgically-related issues.  Otherwise, at this time, he does not need to  have ongoing office surgical management.   [name], P.A.-C.   [name]  D:  05/24/2008  T:  05/24/2008  Job:  [id number]   cc:   Stanley Plane, MD  [name], M.D.

## 2011-05-08 NOTE — Assessment & Plan Note (Signed)
OFFICE VISIT   TANAY, MISURACA  DOB:  08/04/1956                                        May 04, 2008  CHART #:  [id number]   REASON FOR OFFICE VISIT:  3-week followup status post CABG x4.   HISTORY OF PRESENTING ILLNESS:  This is a 55 year old Caucasian male who  presented to [facility] with substernal chest pain radiating to his arm.  He had positive cardiac enzymes.  Cardiac catheterization revealed  multivessel coronary artery disease, and he underwent CABG x4 (LIMA to  LAD, left radial to OM1, reverse saphenous vein grafts to distal  circumflex, reverse saphenous vein graft to totally occluded PDA) by Dr.  Tyrone Sage on April 08, 2008.  Overall, the patient has been progressing  well since being discharged home from the hospital.  His only complaint  is occasional dizziness.  He denies any shortness of breath, chest pain,  fever, chills or night sweats.  His appetite is continuing to improve,  and he takes over walks daily.   PHYSICAL EXAMINATION:  General:  This is a 55-year-lld Caucasian male  who is in no acute distress, who is alert, oriented, and cooperative.  Vital signs reveal BP to be 98/69, heart rate 84, respirations 18, O2  sat 98% on room air.  Cardiovascular:  Regular rate and rhythm.  S1, S2,  without any murmurs, gallops or rubs.  Pulmonary:  Lungs are clear to  auscultation bilaterally.  No rales, wheezes, or rhonchi.  Extremities:  No cyanosis, clubbing, or edema.  The right lower extremity wound is  clean and dry.  The sternal wound is clean, dry.  No sternal instability  noted.  The wound on the left forearm was clean and dry.  There were  multiple scabs; some of these were removed.  A small portion of this  wound closest to the antecubital fossa,  skin edges were slightly  separated.  There was no erythema, no discharge.  His motor and sensory  as well as pulses were intact.   Chest x-ray done today was essentially clear, showed no  pneumothorax,  effusion, or infiltrate.   IMPRESSION AND PLAN:  Overall, Mr. Hoare is progressing well.  He was  encourage to use his incentive spirometer and increase the frequency of  his walks daily.  He was also instructed he may drive in about one more  week, but that he is not to lift more than 10 pounds yet for several  more weeks.  Regarding his hypotension in the office today, he was  instructed to take his blood pressure this evening, and if his systolic  blood pressure was less than 100 he was not to take his Lopressor  tonight.  He does have an appointment see his cardiologist on Thursday.  I have instructed him to let them know that he does have occasional  dizziness (more so at night), but this might be related to hypotension,  and they may need to make adjustments in his medicine, that he is to  continue on all the medicines that he had been taking since discharge,  with the exception of Imdur, which once this is finished he is not to  renew it.  We will see him in the office in one week to recheck the left  forearm wound.  The patient was instructed  if he has any fever, chills,  or there is any erythema or discharge, he is to contact the office  immediately.   Doree Fudge, PA   DZ/MEDQ  D:  05/04/2008  T:  05/04/2008  Job:  [id number]   cc:   Nanetta Batty, M.D.

## 2011-05-08 NOTE — Consult Note (Signed)
NAMEJACQUEES, [name] NO.:  [id number]   MEDICAL RECORD NO.:  [id number]          PATIENT TYPE:  INP   LOCATION:  2908                         FACILITY:  [facility]   PHYSICIAN:  Sheliah Plane, MD    DATE OF BIRTH:  October 11, 1956   DATE OF CONSULTATION:  DATE OF DISCHARGE:                                 CONSULTATION   REQUESTING PHYSICIAN:  Nanetta Batty, MD   PRIMARY CARE PHYSICIAN:  [place].   REASON FOR CONSULTATION:  Unstable angina, positive cardiac enzymes, and  3-vessel coronary artery disease.   HISTORY OF PRESENT ILLNESS:  The patient is a 55 year old male who  presents with several days history of stuttering chest pain radiating to  his left arm, started on Thursday, was intermittent on Friday, and then  again several episodes over the weekend.  He attempted to drive to the  [facility] yesterday, but decided to come to [facility] to be seen  instead and was admitted with positive enzymes.  His peak troponin went  to 3.38.  The patient had no previous history of myocardial infarction,  though he does note an episode of similar kind of pain approximately 1  year ago.  He has had no previous catheterizations or cardiac surgery or  cardiac risk factors.  The patient denies hypertension, denies  hyperlipidemia.  However, his triglycerides are greater than 600, have  been for over 25 years.  He denies diabetes.  His hemoglobin A1c is 7.1.  He is a smoker right up until admission.   FAMILY HISTORY:  Markedly positive for cardiac disease.  His father had  aortic valve coronary artery bypass 6 weeks ago.  His mother had bypass  surgery 10 years ago.   PAST MEDICAL HISTORY:  He has had no previous history of stroke.  He  does have peripheral vascular disease with limiting claudication to  approximately 50 feet.   PAST SURGICAL HISTORY:  1. Rotator cuff x2.  2. Rupture of tophus gout, left great toe.  3. Extensive debridement of the shoulder.  4. The  patient has had history of severe tophus gout with joint      destruction.   SOCIAL HISTORY:  The patient is divorced, lives with his parents.  Worked part-time doing Film/video editor.  Currently  unemployed.  Occasional alcohol use.   MEDICATIONS:  1. Aspirin 325 mg a day.  2. Colchicine 0.6 mg b.i.d.  3. Motrin 600 b.i.d.  4. Amitriptyline 50 mg nightly.  5. Prilosec.  6. Lopid 600 mg a day.   ALLERGIES:  Denies any allergies.   CARDIAC REVIEW OF SYSTEMS:  Positive for chest pain, resting shortness  of breath, exertional shortness of breath.  Denies orthopnea,  presyncope, syncope, palpitation, lower extremity edema.   GENERAL REVIEW OF SYSTEMS:  The patient denies any fever, chills, night  sweats, or change in weight.  RESPIRATORY:  Denies any recent cough.  Denies hemoptysis.  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  Denies any hematuria.  Denies change  in bowel habits.  Does have bilateral claudication, indices in the lower  extremities are 0.54 on the right, and 0.63 on the left.  The patient is  right-handed, but Doppler of the palmar arches is bilaterally intact.   PHYSICAL EXAMINATION:  VITAL SIGNS:  Blood pressure 110/70, pulse is 72,  respiratory rate 14, on room air 100 %, he is 6 feet tall, and he weighs  89 kg. NECK:  He has no carotid bruits.  LUNGS:  Clear bilaterally.  CARDIAC:  Reveals regular rate and rhythm without murmur or gallop.  ABDOMEN:  Benign without palpable masses.  The site of the  catheterization and right groin is without significant hematoma.   LABORATORY DATA:  Hematocrit is 34, hemoglobin 11, white count 7.7,  total cholesterol 199, triglycerides 695,  HDL 24, and creatinine is  0.94.  Cardiac catheterization films were reviewed.  The patient has  depressed LV function with ejection fraction of 40%.  He has a 90%  proximal LAD lesion. The circumflex and the first OM has a  75%-85%  ostial lesion.  The second much smaller distal circumflex has 80%  lesion.  The right coronary artery has luminal irregularities through  the body and the takeoff the PD is totally occluded with collateral  filling from the left.   IMPRESSION:  1. Unstable angina with positive enzymes, decreased left ventricular      function with significant 3-vessel coronary artery disease  2. Severe gouty pleuritis.  3. Severe elevation of triglycerides.  4. Continued tobacco use.  5. Significant claudication with evidence of peripheral vascular      disease.  6. An 80% right renal artery stenosis.   SUGGESTIONS:  With the patient's critical anatomy and unstable anginal  symptoms, coronary artery bypass grafting is recommended to the patient.  The risks of surgery including death, infection, stroke, myocardial  infarction, bleeding, blood transfusion all discussed with the patient  and his family in detail.  We also discussed the possible use of the  left radial artery as a bypass in addition to saphenous vein and mammary  artery.  The risk of bleeding, death, stroke, myocardial infarction,  bleeding, blood transfusion were all discussed and the patient is  willing to proceed.  We will plan to proceed tomorrow.      Sheliah Plane, MD  Electronically Signed     EG/MEDQ  D:  04/07/2008  T:  04/08/2008  Job:  [id number]

## 2011-05-08 NOTE — Op Note (Signed)
NAMESIMPSON, [name]                 ACCOUNT NO.:  [id number]   MEDICAL RECORD NO.:  [id number]          PATIENT TYPE:  INP   LOCATION:  2302                         FACILITY:  [facility]   PHYSICIAN:  Sheliah Plane, MD    DATE OF BIRTH:  [birth year]   DATE OF PROCEDURE:  04/08/2008  DATE OF DISCHARGE:                               OPERATIVE REPORT   PREOPERATIVE DIAGNOSES:  1. Coronary occlusive disease with unstable angina.  2. Enzyme positive subendocardial myocardial infarction.   POSTOPERATIVE DIAGNOSES:  1. Coronary occlusive disease with unstable angina.  2. Enzyme positive subendocardial myocardial infarction.   SURGICAL PROCEDURE:  1. Coronary artery bypass grafting x4 with left internal mammary to      the left anterior descending coronary artery.  2. Left radial artery harvesting and use for bypass to the first      obtuse marginal.  3. Reverse saphenous vein graft to the distal circumflex.  4. Reverse saphenous vein graft to the totally occluded posterior      descending coronary artery.   SURGEON:  Sheliah Plane, MD   FIRST ASSISTANT:  1. [name], PA  2. [name], PA   BRIEF HISTORY:  The patient is a 55 year old male who present with acute  substernal chest pain radiating to the arm with positive cardiac  enzymes.  He was stabilized medically and underwent cardiac  catheterization by Dr. Allyson Sabal.  This revealed acute total occlusion of  the distal right coronary artery with faint collateral filling of the  posterior descending from the left system.  In addition, he had 80-90%  left anterior descending obstruction.  He had large first obtuse  marginal with 80% stenosis.  A small distal circumflex vessel also with  70-80% stenosis.  Overall, ventricular function was depressed with  evidence of akinesis in the inferior wall.  The coronary artery bypass  grafting was recommended.  The patient agreed and signed informed  consent.   DESCRIPTION OF  PROCEDURE:  With Swan-Ganz arterial line monitors in  place, the patient underwent general endotracheal anesthesia without  incidence.  Skin of the chest and legs prepped with Betadine and draped  in the usual sterile manner.  Using a Guidant Endovein Harvesting  System, a segment of vein was harvested from the right thigh.  In  addition, the left arm was prepped and draped, and the left radial  artery was harvested after checking palmar arch and Doppler studies of  the arm.  Using a curve, the radial artery was harvested with open  technique.  Curvilinear incision was made over the volar aspect of the  arm.  The radial artery was located at the wrist; and using a harmonic  scalpel, the surrounding fascial attachments were freed.  The radial  artery was of excellent quality and caliber.  The proximal and distal  ends were ligated, and vessel removed and flushed with heparinized  blood.  The incision was then closed with running 2-0 Vicryl and 4-0  subcuticular stitch.  Dry dressings were applied.  Median sternotomy was  performed.  The left internal mammary  artery was dissected down as  pedicle graft, the  distal artery was divided and had good free flow.  Pericardium was opened.  Overall, ventricular function appeared  preserved anteriorly and laterally.  However, the inferior wall was  significantly scarred to the pericardium and was hypokinetic.  The  patient was systemically heparinized.  Ascending aorta in the right  atrium was cannulated.  An aortic root vent cardioplegic needle was  introduced into the ascending aorta.  The patient was placed on  cardiopulmonary bypass 2.4 liters/minute/meter squared.  Sites of  anastomosis were inspected and dissected out of the epicardium.  The  patient's body temperature was cooled to 30 degrees.  Aortic cross-clamp  was applied.  Cold blood potassium cardioplegia was 500 mL was  administered with rapid diastolic arrest of heart.  Attention was  turned  first to the posterior descending coronary artery, which was totally  occluded.  The vessel was opened and was diffusely diseased, relatively  small vessel 1.3 to 1.4 mm in size.  Using a running 7-0 Prolene, distal  anastomosis was performed.  The heart was then elevated, and the distal  circumflex was identified and was approximately 1 mm in size.  Using a  segment of reverse saphenous vein graft and a running 8-0 Prolene were  used to anastomose segment of reverse saphenous vein graft to the  circumflex coronary artery.  The larger first obtuse marginal was  opened.  The vessel was diffusely diseased.  Using a running 8-0  Prolene, the left radial artery was anastomosed to the first obtuse  marginal.  Attention was then turned to the left anterior descending  coronary artery, which was opened in the mid third of the vessel.  Using  a running 8-0 Prolene, left internal mammary was anastomosed to the left  anterior descending coronary artery.  With the cross-clamp still in  place, 2 punch aortotomies were performed.  Each of the 2 vein grafts  were anastomosed to the ascending aorta.  The hood of the vein graft to  the circumflex was then opened, and the radial artery graft was  anastomosed to the hood of the vein graft to the distal circumflex.  Air  was evacuated from the grafts, and the heart was allowed to passively  fill.  Aortic cross-clamp was removed.  Total cross-clamp time of 114  minutes.  The patient spontaneously converted to a sinus rhythm.  Sites  of anastomosis were inspected free of bleeding.  The patient was then  ventilated and weaned from cardiopulmonary bypass on a low-dose dopamine  and milrinone.  He was decannulated in usual fashion.  Protamine sulfate  was administered with the operative field hemostatic.  Two atrial and  ventricular pacing wires were applied.  Graft markers applied.  The left  pleural tube and Blake mediastinal drain were left in place.   Sternum  was closed with #6 stainless steel wire.  Fascia was closed with  interrupted 0 Vicryl, running 3-0 Vicryl in subcutaneous tissue, 4-0  subcuticular stitch in skin edges.  Dry dressings were applied.  Sponge  and needle count was reported as correct at the completion of the  procedure.  The patient tolerated the procedure well without obvious  complications and was transferred to the Surgical Intensive Care Unit  for further postoperative care.      Sheliah Plane, MD  Electronically Signed     EG/MEDQ  D:  04/09/2008  T:  04/09/2008  Job:  [id number]   cc:  Quay Burow, M.D.

## 2011-05-11 NOTE — Procedures (Signed)
[place]  Patient:    [name], [name]                          MRN: [id number] Proc. Date: 11/18/00 Adm. Date:  [id number] Attending:  Charna Elizabeth CC:         Dr. Janae Sauce, Montez Hageman., [facility]   Procedure Report  DATE OF BIRTH:  1956/05/14.  PROCEDURE:  Esophagogastroduodenoscopy.  ENDOSCOPIST:  Anselmo Rod, M.D.  INSTRUMENT USED:  Olympus video panendoscope.  INDICATION FOR PROCEDURE:  Epigastric pain with a normal abdominal ultrasound in a 55 year old white male with a history of hepatitis C and gout.  Rule out peptic ulcer disease, esophagitis, gastritis, etc.  PREPROCEDURE PREPARATION:  Informed consent was procured from the patient. The patient was fasted for eight hours prior to the procedure.  PREPROCEDURE PHYSICAL:  VITAL SIGNS:  The patient had stable vital signs.  NECK:  Supple.  CHEST:  Clear to auscultation.  S1, S2 regular.  ABDOMEN:  Soft with epigastric tenderness on palpation with guarding.  No rebound or rigidity or hepatosplenomegaly.  DESCRIPTION OF PROCEDURE:  The patient was placed in the left lateral decubitus position and sedated with 50 mg of Demerol and 5 mg of Versed intravenously.  Once the patient was adequately sedate and maintained on low-flow oxygen and continuous cardiac monitoring, the Olympus video panendoscope was advanced through the mouthpiece, over the tongue, into the esophagus, under direct vision.  The entire esophagus appeared without evidence of ring, stricture, masses, lesions, esophagitis, or Barretts mucosa.  The scope was then advanced in the stomach.  A small hiatal hernia was seen on high retroflexion.  There was mild antral gastritis.  No erosions, ulcerations, masses, or polyps were seen.  The proximal small bowel, including the duodenal bulb up to 60 cm, appeared normal.  There was no outlet obstruction.  The patient tolerated the procedure well without  complication.  IMPRESSION: 1. Small hiatal hernia. 2. Mild antral gastritis. 3. Normal-appearing esophagus and proximal small bowel. 4. No ulcer seen.  RECOMMENDATION: 1. Continue Nexium for now. 2. Avoid nonsteroidals, and antireflux measures emphasized. 3. Outpatient follow-up in the next two weeks. DD:  11/18/00 TD:  11/18/00 Job: [id number]

## 2011-05-11 NOTE — H&P (Signed)
NAME:  [name], [name]                             ACCOUNT NO.:  [id number]   MEDICAL RECORD NO.:  [id number]                   PATIENT TYPE:  EMS   LOCATION:  ED                                   FACILITY:  [facility]   PHYSICIAN:  Georgann Housekeeper, M.D.                 DATE OF BIRTH:  [birth year]   DATE OF ADMISSION:  01/01/2003  DATE OF DISCHARGE:                                HISTORY & PHYSICAL   CHIEF COMPLAINT:  Shortness of breath, fever, and cough.   HISTORY OF PRESENT ILLNESS:  The patient is a 55 year old male without any  significant past medical history.  He came into the emergency room with two-  day history of cough, fevers as high as 103, shortness of breath which got  worse today.  He had not much URI symptoms to start with.  Went to the  [facility], and there he got a chest x-ray which showed  bilateral infiltrates and mild hypoxia.  He was sent to the [facility]  Emergency Room for further workup.  The patient reports that he was doing  fine until two days ago when he started having a little bit of illness,  fever, and cough with progressive shortness of breath today.  Cough was  nonproductive.  No nausea, vomiting, or diarrhea.  Poor appetite today.  Had  no sore throat, no headaches, or dizziness.  On Monday, December 28, 2002, he  had acute flare-up of gout on the right ankle, was seen at [facility], was started on some prednisone.  He has been on chronic gout  medication, allopurinol and colchicine.  Seeing a rheumatologist.  As far as  in the emergency room, he was mildly hypoxic with a pO2 of 50 on ABG, was  hemodynamically stable.   PAST MEDICAL HISTORY:  1. Gout.  He has been seeing a rheumatologist, he thinks Dr. Jimmy Footman.     Recent flare-up in the right ankle.  2. History of walking pneumonia two years ago, treated outpatient.   MEDICATIONS:  1. Allopurinol 300 mg daily.  2. Colchicine 0.6 2 tablets a day.  3. Prednisone taper.  He  took 19, now is supposed to take 20 for a couple     more days.  4. Lortab for pain p.r.n.  5. Motrin p.r.n.   PAST SURGICAL HISTORY:  Status post T&A.   ALLERGIES:  None.   SOCIAL HISTORY:  He smokes tobacco, 1-1/2 packs per day for 30 years.  Alcohol, occasional.  Coffee, none.  He is self-employed.  Does remodeling  work, Event organiser.  Married, with one daughter and one stepdaughter.   FAMILY HISTORY:  Grandmother has diabetes.  Mom had coronary artery disease.   REVIEW OF SYSTEMS:  As mentioned above.   PHYSICAL EXAMINATION:  VITAL SIGNS:  His temperature was 98.6, blood  pressure 128/81, respirations 20, pulse of  90, normal sinus.  Saturations  were 90% on room air.  GENERAL:  Mildly ill-appearing male in no acute distress.  Alert and  oriented.  Talking in full sentences.  HEENT:  Pupils equal and reactive.  Oropharynx is clear.  NECK:  Supple.  No adenopathy.  LUNGS:  Bibasilar crackles and rhonchi diffusely.  CARDIAC:  S1, S2, without any murmurs.  ABDOMEN:  Soft.  Without any tenderness.  EXTREMITIES:  There was no edema.  On the right arm he had a patch from his  tattoo removal, and on the upper chest he had a scar from injury while he  was working with a beam hitting on the chest.  NEUROLOGIC:  Nonfocal.   LABORATORY DATA:  White count 8.9 with mild left shift, hemoglobin of 13,  platelets 183.  Chemistries:  Sodium 137, potassium 3.7, BUN of 11,  creatinine 0.8, glucose 215, bicarbonate 24.  LFTs are normal.  ABG on room  air showed a pH of 7.5, pCO2 of 32, pO2 of 50.  Blood cultures will be sent.   Chest x-ray:  Bilateral infiltrates on the lower lung fields.   EKG ordered.   IMPRESSION:  The patient is an 55 year old male without any significant past  medical history except for tobacco abuse and gout, with two-day history of  cough and fevers, worsening, with infiltrates bilaterally, with normal white  count.   PLAN:  1. Pneumonia, bilateral:  Differential  includes community-acquired although     with the sudden onset with normal white count I will check influenza     titers, put him on antibiotic Rocephin, Zithromax.  If the influenza     titers are positive he will need antiviral.  Continue with the nebulizer     treatment and oxygen, IV fluids.  2. For gout, I would continue with some Vicodin.  He is to finish off a few     days of prednisone.  I will hold off in the setting of infection.     Continue colchicine, allopurinol.  3. I will repeat a chest x-ray again tomorrow, follow up on the blood     cultures and white count.  4. Elevated blood sugar nonfasting probably on steroid:  Will check another     one in the morning.                                               Georgann Housekeeper, M.D.    [name]  D:  01/01/2003  T:  01/02/2003  Job:  [id number]

## 2011-05-11 NOTE — Group Therapy Note (Signed)
MEDICAL RECORD NUMBER:  [id number].   DATE OF BIRTH:  [birth year].   [name] is a 55 year old, married, white male who was referred by Dr.  Magnus Ivan for pain management of multiple joint pain.   [name] reports his main complaint is bilateral shoulder pain.  He  describes it as a deep aching into both shoulders, worse with moving and any  kind of activity.   His shoulder pain began back in December of 2004 when he was working on a  Clinical cytogeneticist for [employer].  He apparently was going to climb down from the  rafters to go down a ladder.  He swung down from the rafters and was hanging  with his hands and pulled his shoulders bilaterally.   He was seen by Dr. Magnus Ivan.  He was evaluated by [facility].  He  eventually underwent a right shoulder rotator cuff repair.  He did have an  MRI of the right shoulder on August 14, 2004, which showed full-thickness of  the distal supraspinatus, tendinoplasty of the biceps anteriorly, type 2  slap injury and AC joint degenerative changes with subacromial spurring.   Since surgery he has been involved in a physical therapy program.  He has  been using hydrocodone 5/500 mg for pain management.  He was last given 33  of them on October 17, 2004.  They lasted him about five or six days.  He  reports he had been taking approximately 12 a day.   His shoulder surgery was on August 24, 2004.   He reports some functional limitations with his shoulders.  He is able to  get his left upper extremity to his head and posteriorly.  He has increased  limitations with the right shoulder at this point.  Pain is in both  shoulders.  The average score is about a 7-8 on a scale of 10.  He uses ice  pack and heating pad for pain relief and uses medications for pain relief as  well.  He denies any problems with neck and numbness, tingling and bowel or  bladder problems.   FUNCTIONAL HISTORY:  The patient has difficulty getting his shirt over his  head.  He has some difficulty reaching with his shoulders to get his socks  on.  He is able to walk about 100 yards.  He reports some leg cramps with  walking more than that.   PAST MEDICAL HISTORY:  Remarkable for:  1.  Gout x 20 years.  He has been on and off of prednisone intermittently.      He takes allopurinol and colchicine daily.  2.  History of hepatitis C/liver disease.  3.  History of some stomach problems.  I am not clear if he has had ulcers.      He tells me he has really only had hiatal hernia and some acid reflux.  4.  History of hypopituitarism with decreased FSH, LH and testosterone.   The patient denies any problems with diabetes, cancer, kidney problems,  thyroid problems, heart problems or high blood pressure.  He does admit to  hypercholesterolemia.   PAST SURGICAL HISTORY:  Remarkable for:  1.  On August 24, 2004, rotator cuff surgery.  2.  In October of 2003, he had had pneumonia.  3.  In 1963, tonsillectomy.   MEDICATIONS:  Current medications at this time include the following:  1.  Allopurinol 300 mg daily.  2.  Lipitor 10 mg daily.  3.  Celebrex one  p.o. daily.  4.  Vicodin 5/500 mg.  Recently has taken 12 a day.  Stopped two days ago.  5.  Aciphex one daily.   EMOTIONAL STATUS:  The patient denies any suicidal ideation.  He does admit  to some anxiety and depression.  He does report some sleeping problems.   SOCIAL HISTORY:  The patient is married.  He has a 37 year old daughter at  home.  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  He reports that two  years ago he had been working on his PhD in a molecular biology program.  He  believes he may have approximately four more years to work on this.   He has a history of a DUI in 1998.   He has a smoking history of one-and-a-half packs per day x 30 years.   Admits to previous illicit drug use back in the 1970s.  Denies  more recent  use.   FAMILY HISTORY:  Remarkable for mother, age 41, with heart disease.  Father,  age 77, with heart disease.   ALLERGIES:  The patient denies any allergies.   REVIEW OF SYSTEMS:  The patient reports some nausea, abdominal pain, easy  bleeding, bruising, swelling, some spasm, anxiety and poor sleep.   PHYSICAL EXAMINATION:  He is a somewhat tired-appearing gentleman.  He is  oriented.  He is appropriate and cooperative.   The blood pressure is 128/84, respirations 22, pulse 97 and 98% saturated on  room air.  He appears fairly comfortable during our interview.  He is able  to get out of the chair.  Gait in the room is normal.  Romberg's test is  negative.  I do not observe any tremors today in upper or lower extremities.  Seated reflexes are 1+ throughout.  Toes are downgoing.  No clonus is noted.  He has fairly good motor strength in the upper extremities within his range  of motion.  He is limited in bilateral shoulders, especially with abduction,  forward flexion, external rotation and internal rotation of both shoulders.  He has a bit more external rotation passively on the left than he does on  the right at this point.  He is able to reach behind his head with his left  upper extremity and unable to do so with his right upper extremity.   Motor strength in the lower extremities is 5/5 throughout.   Obvious deformities at the metatarsal joints in the feet, at the patellar  regions bilaterally (he has prominences bilaterally) and at the wrists  bilaterally, most likely secondary to his gout.   Sensation in both upper and lower extremities is overall intact.   He has good cervical range of motion with forward flexion, extension and  lateral flexion.   IMPRESSION:  1.  Status post right shoulder rotator cuff repair on August 24, 2004, by      Dr. Magnus Ivan. 2.  A 20-year history of gout.  3.  Probable history of gastritis, hiatal hernia or acid reflux.  4.   Recent history of some insomnia.  5.  Depression.  6.  Probable history of some withdrawal symptoms.  The patient has had some      difficulties with sleeping over the last few days after his narcotics      have run out.  Also, episodes of anxiety and abdominal pain.  7.  History of hepatitis  C with liver disease.   PLAN:  Will start the patient on Effexor XR 37.5 mg for seven days and then  75 mg p.o. daily.  Will add Lidoderm for pain management.  Nursing discussed  with him how to use it.  Will add Elavil to help him rest at night 10 mg  q.h.s.  May need to go up on this at the next visit.  Will check urine drug  screen prior to starting on any kind of narcotics.  We have discussed the  possibility of using Duragesic with him.  We may go ahead and start that  next week.  We will start it at 12.5 mcg/hr.   Reviewed issues of dependency and tolerance, as well as withdrawal symptoms  with respect to opioids with [name].  Also cautioned regarding driving  and cognitive decisions while on opioids.  He expresses understanding and  willingness to cooperate.  I will see him back in one month.  Will have him  return next week for Duragesic.  Nursing will review how to use this  particular patch with him.  Apparently he also has an appointment with Dr.  Corliss Skains.      DMK/MedQ  D:  10/26/2004 12:16:48  T:  10/26/2004 16:12:56  Job #:  [id number]   cc:   Vanita Panda. Magnus Ivan, M.D.  Fax: [phone]

## 2011-09-18 LAB — CBC
HCT: 37.7 — ABNORMAL LOW
HCT: 21.7 — ABNORMAL LOW
HCT: 22.7 — ABNORMAL LOW
HCT: 23.2 — ABNORMAL LOW
HCT: 23.7 — ABNORMAL LOW
HCT: 23.9 — ABNORMAL LOW
HCT: 25.4 — ABNORMAL LOW
HCT: 26.1 — ABNORMAL LOW
HCT: 26.2 — ABNORMAL LOW
HCT: 27.2 — ABNORMAL LOW
HCT: 28.3 — ABNORMAL LOW
HCT: 33.3 — ABNORMAL LOW
HCT: 34.7 — ABNORMAL LOW
Hemoglobin: 11.5 — ABNORMAL LOW
Hemoglobin: 7.5 — CL
Hemoglobin: 8 — ABNORMAL LOW
Hemoglobin: 8.1 — ABNORMAL LOW
Hemoglobin: 8.1 — ABNORMAL LOW
Hemoglobin: 8.4 — ABNORMAL LOW
Hemoglobin: 8.8 — ABNORMAL LOW
Hemoglobin: 9.3 — ABNORMAL LOW
Hemoglobin: 9.4 — ABNORMAL LOW
Hemoglobin: 9.4 — ABNORMAL LOW
Hemoglobin: 9.5 — ABNORMAL LOW
MCHC: 34.1
MCHC: 34.4
MCHC: 33.7
MCHC: 34.1
MCHC: 34.2
MCHC: 34.5
MCHC: 34.5
MCHC: 34.7
MCHC: 35
MCHC: 35.1
MCHC: 35.2
MCHC: 35.5
MCV: 84.3
MCV: 85.1
MCV: 83.9
MCV: 84.2
MCV: 84.2
MCV: 84.4
MCV: 84.7
MCV: 84.7
MCV: 84.9
MCV: 85.1
MCV: 85.8
MCV: 86
Platelets: 179
Platelets: 191
Platelets: 139 — ABNORMAL LOW
Platelets: 145 — ABNORMAL LOW
Platelets: 150
Platelets: 153
Platelets: 159
Platelets: 181
Platelets: 210
Platelets: 250
Platelets: 281
Platelets: 310
Platelets: 317
Platelets: 334
RBC: 3.93 — ABNORMAL LOW
RBC: 2.58 — ABNORMAL LOW
RBC: 2.7 — ABNORMAL LOW
RBC: 2.74 — ABNORMAL LOW
RBC: 2.81 — ABNORMAL LOW
RBC: 2.81 — ABNORMAL LOW
RBC: 2.95 — ABNORMAL LOW
RBC: 3.12 — ABNORMAL LOW
RBC: 3.17 — ABNORMAL LOW
RBC: 3.22 — ABNORMAL LOW
RBC: 3.33 — ABNORMAL LOW
RBC: 3.93 — ABNORMAL LOW
RDW: 14.8
RDW: 14.8
RDW: 15
RDW: 15
RDW: 15.1
RDW: 15.1
RDW: 15.1
RDW: 15.2
RDW: 15.3
RDW: 15.4
RDW: 15.5
RDW: 15.5
RDW: 15.6 — ABNORMAL HIGH
WBC: 6.1
WBC: 11.6 — ABNORMAL HIGH
WBC: 5.6
WBC: 6.6
WBC: 6.6
WBC: 6.9
WBC: 7.6
WBC: 8.7
WBC: 8.8
WBC: 8.9
WBC: 9.4
WBC: 9.7
WBC: 9.9

## 2011-09-18 LAB — POCT I-STAT, CHEM 8
BUN: 11
Creatinine, Ser: 1
Creatinine, Ser: 0.9
HCT: 50
Hemoglobin: 17
Hemoglobin: 8.5 — ABNORMAL LOW
Potassium: 4.2
Potassium: 3.9
Sodium: 138
Sodium: 138
Sodium: 141
TCO2: 25
TCO2: 22

## 2011-09-18 LAB — BASIC METABOLIC PANEL
BUN: 15
BUN: 15
BUN: 16
BUN: 16
BUN: 17
BUN: 17
BUN: 17
BUN: 21
BUN: 9
CO2: 22
CO2: 24
CO2: 24
CO2: 24
CO2: 24
CO2: 25
CO2: 26
Calcium: 7.6 — ABNORMAL LOW
Calcium: 8.2 — ABNORMAL LOW
Calcium: 8.3 — ABNORMAL LOW
Calcium: 8.3 — ABNORMAL LOW
Calcium: 8.3 — ABNORMAL LOW
Calcium: 8.3 — ABNORMAL LOW
Calcium: 8.4
Calcium: 8.4
Calcium: 8.8
Chloride: 103
Chloride: 103
Chloride: 104
Chloride: 106
Chloride: 95 — ABNORMAL LOW
Chloride: 95 — ABNORMAL LOW
Chloride: 98
Creatinine, Ser: 0.84
Creatinine, Ser: 0.85
Creatinine, Ser: 0.85
Creatinine, Ser: 0.91
Creatinine, Ser: 0.94
Creatinine, Ser: 0.99
Creatinine, Ser: 1.09
Creatinine, Ser: 1.16
Creatinine, Ser: 1.19
Creatinine, Ser: 1.3
GFR calc Af Amer: >60
GFR calc Af Amer: >60
GFR calc Af Amer: >60
GFR calc Af Amer: >60
GFR calc Af Amer: >60
GFR calc Af Amer: >60
GFR calc Af Amer: >60
GFR calc Af Amer: >60
GFR calc non Af Amer: 58 — ABNORMAL LOW
GFR calc non Af Amer: >60
GFR calc non Af Amer: >60
GFR calc non Af Amer: >60
GFR calc non Af Amer: >60
GFR calc non Af Amer: >60
GFR calc non Af Amer: >60
GFR calc non Af Amer: >60
GFR calc non Af Amer: >60
GFR calc non Af Amer: >60
Glucose, Bld: 103 — ABNORMAL HIGH
Glucose, Bld: 105 — ABNORMAL HIGH
Glucose, Bld: 107 — ABNORMAL HIGH
Glucose, Bld: 116 — ABNORMAL HIGH
Glucose, Bld: 118 — ABNORMAL HIGH
Glucose, Bld: 121 — ABNORMAL HIGH
Glucose, Bld: 123 — ABNORMAL HIGH
Glucose, Bld: 130 — ABNORMAL HIGH
Glucose, Bld: 134 — ABNORMAL HIGH
Glucose, Bld: 95
Potassium: 3.6
Potassium: 3.6
Potassium: 3.7
Potassium: 3.8
Potassium: 4
Potassium: 4.1
Potassium: 4.4
Potassium: 4.7
Sodium: 130 — ABNORMAL LOW
Sodium: 131 — ABNORMAL LOW
Sodium: 131 — ABNORMAL LOW
Sodium: 134 — ABNORMAL LOW
Sodium: 134 — ABNORMAL LOW
Sodium: 135
Sodium: 136
Sodium: 137
Sodium: 137

## 2011-09-18 LAB — HEPARIN LEVEL (UNFRACTIONATED)
Heparin Unfractionated: <0.1 — ABNORMAL LOW
Heparin Unfractionated: 0.22 — ABNORMAL LOW
Heparin Unfractionated: 0.65

## 2011-09-18 LAB — COMPREHENSIVE METABOLIC PANEL
ALT: 13
AST: 16
AST: 16
Albumin: 2.4 — ABNORMAL LOW
Albumin: 3.1 — ABNORMAL LOW
Alkaline Phosphatase: 71
BUN: 8
BUN: 18
BUN: 9
CO2: 26
CO2: 29
Calcium: 8.2 — ABNORMAL LOW
Calcium: 8.7
Chloride: 102
Chloride: 105
Chloride: 97
Creatinine, Ser: 0.82
Creatinine, Ser: 0.92
Creatinine, Ser: 1
GFR calc Af Amer: >60
GFR calc Af Amer: >60
GFR calc non Af Amer: >60
GFR calc non Af Amer: >60
Glucose, Bld: 89
Glucose, Bld: 94
Potassium: 3.7
Sodium: 135
Total Bilirubin: 0.6
Total Bilirubin: 0.4
Total Bilirubin: 0.7
Total Protein: 5.3 — ABNORMAL LOW

## 2011-09-18 LAB — URINALYSIS, ROUTINE W REFLEX MICROSCOPIC
Glucose, UA: NEGATIVE
Hgb urine dipstick: NEGATIVE
Protein, ur: NEGATIVE
pH: 6.5

## 2011-09-18 LAB — POCT I-STAT 4, (NA,K, GLUC, HGB,HCT)
Glucose, Bld: 125 — ABNORMAL HIGH
Glucose, Bld: 92
HCT: 24 — ABNORMAL LOW
HCT: 28 — ABNORMAL LOW
Hemoglobin: 7.5 — CL
Hemoglobin: 9.5 — ABNORMAL LOW
Hemoglobin: 9.5 — ABNORMAL LOW
Operator id: 3342
Operator id: 3342
Potassium: 4.3
Potassium: 4.4
Potassium: 4.6
Potassium: 5
Sodium: 136
Sodium: 136
Sodium: 136

## 2011-09-18 LAB — CREATININE, SERUM
Creatinine, Ser: 0.89
Creatinine, Ser: 1.4
GFR calc Af Amer: >60
GFR calc Af Amer: >60
GFR calc non Af Amer: 53 — ABNORMAL LOW
GFR calc non Af Amer: >60

## 2011-09-18 LAB — HEMOGLOBIN AND HEMATOCRIT, BLOOD
HCT: 26.3 — ABNORMAL LOW
HCT: 26.3 — ABNORMAL LOW
Hemoglobin: 8.8 — ABNORMAL LOW
Hemoglobin: 8.9 — ABNORMAL LOW

## 2011-09-18 LAB — POCT I-STAT 3, ART BLOOD GAS (G3+)
Acid-Base Excess: 3 — ABNORMAL HIGH
Acid-base deficit: 2
Acid-base deficit: 2
Bicarbonate: 22.7
Bicarbonate: 24.7 — ABNORMAL HIGH
O2 Saturation: 94
O2 Saturation: 94
Operator id: 280991
Operator id: 3342
Operator id: 3342
TCO2: 24
TCO2: 26
TCO2: 26
pCO2 arterial: 32.2 — ABNORMAL LOW
pCO2 arterial: 36.5
pCO2 arterial: 37.8
pCO2 arterial: 38.6
pCO2 arterial: 43.5
pH, Arterial: 7.352
pH, Arterial: 7.368
pH, Arterial: 7.381
pH, Arterial: 7.394
pO2, Arterial: 140 — ABNORMAL HIGH
pO2, Arterial: 352 — ABNORMAL HIGH
pO2, Arterial: 62 — ABNORMAL LOW
pO2, Arterial: 74 — ABNORMAL LOW

## 2011-09-18 LAB — EXPECTORATED SPUTUM ASSESSMENT W GRAM STAIN, RFLX TO RESP C

## 2011-09-18 LAB — POCT I-STAT 3, VENOUS BLOOD GAS (G3P V)
Acid-base deficit: 3 — ABNORMAL HIGH
Bicarbonate: 22.4
O2 Saturation: 88
pO2, Ven: 56 — ABNORMAL HIGH

## 2011-09-18 LAB — CROSSMATCH
ABO/RH(D): A POS
ABO/RH(D): A POS
Antibody Screen: NEGATIVE

## 2011-09-18 LAB — CARDIAC PANEL(CRET KIN+CKTOT+MB+TROPI): Total CK: 84

## 2011-09-18 LAB — BLOOD GAS, ARTERIAL
O2 Content: 2
Patient temperature: 98
pCO2 arterial: 39.3
pH, Arterial: 7.378

## 2011-09-18 LAB — POCT CARDIAC MARKERS
CKMB, poc: 7.8
Operator id: 234501
Operator id: 265201
Troponin i, poc: 1.81

## 2011-09-18 LAB — DIFFERENTIAL
Eosinophils Absolute: 0.1
Eosinophils Relative: 2
Lymphs Abs: 1.6

## 2011-09-18 LAB — CULTURE, RESPIRATORY W GRAM STAIN: Culture: NORMAL

## 2011-09-18 LAB — CK TOTAL AND CKMB (NOT AT ARMC)
CK, MB: 12.5 — ABNORMAL HIGH
CK, MB: 3.6
Relative Index: 2.4
Total CK: 514 — ABNORMAL HIGH

## 2011-09-18 LAB — APTT
aPTT: 142 — ABNORMAL HIGH
aPTT: 34

## 2011-09-18 LAB — TROPONIN I: Troponin I: 2.83

## 2011-09-18 LAB — PROTIME-INR
INR: 0.9
INR: 1.3
Prothrombin Time: 16.5 — ABNORMAL HIGH

## 2011-09-18 LAB — MAGNESIUM
Magnesium: 2.3
Magnesium: 2.5

## 2011-09-18 LAB — LIPID PANEL
HDL: 23 — ABNORMAL LOW
LDL Cholesterol: UNDETERMINED
Total CHOL/HDL Ratio: 7.3
Triglycerides: 561 — ABNORMAL HIGH
Triglycerides: 685 — ABNORMAL HIGH
VLDL: UNDETERMINED

## 2011-09-18 LAB — HEMOGLOBIN A1C: Hgb A1c MFr Bld: 5.5

## 2011-09-18 LAB — B-NATRIURETIC PEPTIDE (CONVERTED LAB): Pro B Natriuretic peptide (BNP): 176 — ABNORMAL HIGH

## 2011-09-22 ENCOUNTER — Emergency Department (HOSPITAL_COMMUNITY)
Admission: EM | Admit: 2011-09-22 | Discharge: 2011-09-23 | Discharge status: Against Medical Advice | Payer: Self-pay | Attending: Emergency Medicine | Admitting: Emergency Medicine

## 2011-09-22 DIAGNOSIS — Z0389 Encounter for observation for other suspected diseases and conditions ruled out: Secondary | ICD-10-CM | POA: Insufficient documentation

## 2011-09-24 LAB — CBC
HCT: 34.1 — ABNORMAL LOW
Hemoglobin: 11.6 — ABNORMAL LOW
Hemoglobin: 13.1
MCHC: 33.4
MCHC: 33.8
MCV: 84.2
Platelets: 221
RBC: 4.11 — ABNORMAL LOW
RBC: 4.61
RDW: 19.1 — ABNORMAL HIGH
WBC: 9

## 2011-09-24 LAB — COMPREHENSIVE METABOLIC PANEL
ALT: 11
ALT: 12
AST: 16
Albumin: 3.9
Albumin: 4.3
Alkaline Phosphatase: 86
Alkaline Phosphatase: 98
CO2: 23
Calcium: 9.1
Chloride: 98
Creatinine, Ser: 0.77
GFR calc Af Amer: >60
GFR calc non Af Amer: >60
Glucose, Bld: 104 — ABNORMAL HIGH
Potassium: 3.4 — ABNORMAL LOW
Potassium: 3.6
Sodium: 134 — ABNORMAL LOW
Sodium: 138
Total Bilirubin: 0.6
Total Protein: 6.6

## 2011-09-24 LAB — BASIC METABOLIC PANEL
CO2: 25
Chloride: 101
GFR calc Af Amer: >60
Potassium: 3.7
Sodium: 136

## 2011-09-24 LAB — LIPID PANEL
Cholesterol: 184
HDL: 19 — ABNORMAL LOW
Total CHOL/HDL Ratio: 9.7
Triglycerides: 528 — ABNORMAL HIGH
VLDL: UNDETERMINED

## 2011-09-24 LAB — POCT I-STAT, CHEM 8
Calcium, Ion: 1.13
Creatinine, Ser: 1
Hemoglobin: 13.3
Sodium: 137
TCO2: 26

## 2011-09-24 LAB — CARDIAC PANEL(CRET KIN+CKTOT+MB+TROPI)
CK, MB: 1.2
CK, MB: 1.6
Relative Index: INVALID
Total CK: 55
Total CK: 75
Troponin I: 0.01

## 2011-09-24 LAB — DIFFERENTIAL
Basophils Relative: 0
Lymphs Abs: 1.9
Monocytes Absolute: 0.7
Monocytes Relative: 8
Neutro Abs: 5.4
Neutrophils Relative %: 66

## 2011-09-24 LAB — TSH: TSH: 1.306

## 2011-09-24 LAB — PROTIME-INR
INR: 1
Prothrombin Time: 13.7

## 2011-09-24 LAB — MAGNESIUM: Magnesium: 2.2

## 2011-09-24 LAB — TROPONIN I: Troponin I: 0.01

## 2011-09-24 LAB — B-NATRIURETIC PEPTIDE (CONVERTED LAB): Pro B Natriuretic peptide (BNP): 35

## 2011-09-24 LAB — POCT CARDIAC MARKERS: Myoglobin, poc: 109

## 2011-12-05 ENCOUNTER — Ambulatory Visit (HOSPITAL_COMMUNITY): Admission: RE | Admit: 2011-12-05 | Payer: Self-pay | Source: Ambulatory Visit

## 2011-12-11 ENCOUNTER — Ambulatory Visit (HOSPITAL_COMMUNITY)
Admission: RE | Admit: 2011-12-11 | Discharge: 2011-12-11 | Disposition: A | Discharge status: Home / Self Care | Payer: Non-veteran care | Source: Ambulatory Visit | Attending: Surgery | Admitting: Surgery

## 2011-12-11 DIAGNOSIS — G8918 Other acute postprocedural pain: Secondary | ICD-10-CM

## 2011-12-11 DIAGNOSIS — F17200 Nicotine dependence, unspecified, uncomplicated: Secondary | ICD-10-CM

## 2011-12-11 DIAGNOSIS — L97409 Non-pressure chronic ulcer of unspecified heel and midfoot with unspecified severity: Secondary | ICD-10-CM

## 2011-12-11 DIAGNOSIS — I739 Peripheral vascular disease, unspecified: Secondary | ICD-10-CM

## 2011-12-11 DIAGNOSIS — M79609 Pain in unspecified limb: Secondary | ICD-10-CM | POA: Insufficient documentation

## 2011-12-11 DIAGNOSIS — L98499 Non-pressure chronic ulcer of skin of other sites with unspecified severity: Secondary | ICD-10-CM

## 2011-12-11 NOTE — Progress Notes (Signed)
*  PRELIMINARY RESULTS* ABIs indicate a moderate reduction in arterial flow on the right and normal flow on the left  [place], [place] D 12/11/2011, 11:53 AM

## 2011-12-20 ENCOUNTER — Emergency Department (HOSPITAL_COMMUNITY)
Admission: EM | Admit: 2011-12-20 | Discharge: 2011-12-20 | Disposition: A | Discharge status: Home / Self Care | Payer: Non-veteran care | Attending: Emergency Medicine | Admitting: Emergency Medicine

## 2011-12-20 ENCOUNTER — Emergency Department (HOSPITAL_COMMUNITY): Payer: Non-veteran care

## 2011-12-20 ENCOUNTER — Other Ambulatory Visit: Payer: Self-pay

## 2011-12-20 ENCOUNTER — Encounter: Payer: Self-pay | Admitting: Emergency Medicine

## 2011-12-20 DIAGNOSIS — W010XXA Fall on same level from slipping, tripping and stumbling without subsequent striking against object, initial encounter: Secondary | ICD-10-CM | POA: Insufficient documentation

## 2011-12-20 DIAGNOSIS — Z951 Presence of aortocoronary bypass graft: Secondary | ICD-10-CM | POA: Insufficient documentation

## 2011-12-20 DIAGNOSIS — M25569 Pain in unspecified knee: Secondary | ICD-10-CM | POA: Insufficient documentation

## 2011-12-20 DIAGNOSIS — R079 Chest pain, unspecified: Secondary | ICD-10-CM | POA: Insufficient documentation

## 2011-12-20 DIAGNOSIS — R0602 Shortness of breath: Secondary | ICD-10-CM | POA: Insufficient documentation

## 2011-12-20 DIAGNOSIS — M25562 Pain in left knee: Secondary | ICD-10-CM

## 2011-12-20 HISTORY — DX: Bipolar II disorder: F31.81

## 2011-12-20 LAB — COMPREHENSIVE METABOLIC PANEL
ALT: 9 U/L (ref 0–53)
AST: 13 U/L (ref 0–37)
CO2: 23 mEq/L (ref 19–32)
Chloride: 99 mEq/L (ref 96–112)
Creatinine, Ser: 0.69 mg/dL (ref 0.50–1.35)
GFR calc Af Amer: >90 mL/min (ref >90)
GFR calc non Af Amer: >90 mL/min (ref >90)
Glucose, Bld: 90 mg/dL (ref 70–99)
Total Bilirubin: 0.4 mg/dL (ref 0.3–1.2)

## 2011-12-20 LAB — CBC
HCT: 34.7 % — ABNORMAL LOW (ref 39.0–52.0)
Hemoglobin: 11.7 g/dL — ABNORMAL LOW (ref 13.0–17.0)
MCV: 79.6 fL (ref 78.0–100.0)
RBC: 4.36 MIL/uL (ref 4.22–5.81)
RDW: 14.1 % (ref 11.5–15.5)
WBC: 6.2 10*3/uL (ref 4.0–10.5)

## 2011-12-20 LAB — CARDIAC PANEL(CRET KIN+CKTOT+MB+TROPI): CK, MB: 3.4 ng/mL (ref 0.3–4.0)

## 2011-12-20 LAB — DIFFERENTIAL
Basophils Absolute: 0 10*3/uL (ref 0.0–0.1)
Eosinophils Relative: 2 % (ref 0–5)
Lymphocytes Relative: 30 % (ref 12–46)
Lymphs Abs: 1.8 10*3/uL (ref 0.7–4.0)
Monocytes Absolute: 0.6 10*3/uL (ref 0.1–1.0)
Neutro Abs: 3.7 10*3/uL (ref 1.7–7.7)

## 2011-12-20 LAB — POCT I-STAT TROPONIN I: Troponin i, poc: 0 ng/mL (ref 0.00–0.08)

## 2011-12-20 MED ORDER — SODIUM CHLORIDE 0.9 % IV SOLN
Freq: Once | INTRAVENOUS | Status: AC
  Administered 2011-12-20: 11:00:00 via INTRAVENOUS

## 2011-12-20 MED ORDER — NITROGLYCERIN 2 % TD OINT
1.0000 [in_us] | TOPICAL_OINTMENT | Freq: Once | TRANSDERMAL | Status: AC
  Administered 2011-12-20: 1 [in_us] via TOPICAL
  Filled 2011-12-20: qty 30

## 2011-12-20 NOTE — ED Provider Notes (Signed)
History     CSN: [id number]  Arrival date & time 12/20/11  4098   First MD Initiated Contact with Patient 12/20/11 [phone]      Chief Complaint  Patient presents with  . Knee Pain  . Fall    (Consider location/radiation/quality/duration/timing/severity/associated sxs/prior treatment) HPI History is obtained from the patient. He presents from home with a chief complaint of chest pain. States this started last evening around 9:30, and is described as a pressure sensation in the left chest. 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 He believes that this pain feels somewhat similar to one of his previous MIs. He continues to smoke about a pack a day. Has a remote hx of cocaine use (early 47s) but denies any more recent use.  He took 2 325 mg aspirin at home last evening. He thinks this did help the pain somewhat. He does not have nitroglycerin at home to take. He denies associated shortness of breath or diaphoresis; he did not have nausea or vomiting with this. The pain is still present, but has eased somewhat since last evening. No known aggravating factors.  He also states that he slipped and fell on his left knee this morning. He slipped coming back into his house and twisted his knee. Pain is located on the medial aspect of the knee. It is described as a dull sensation and does not radiate. It is worsened with movement.  The patient is a somewhat unreliable historian.  Past Medical History  Diagnosis Date  . Bipolar 2 disorder   . Gout     Past Surgical History  Procedure Date  . Coronary stent placement   . Coronary artery bypass graft     No family history on file.  History  Substance Use Topics  . Smoking status: Current Everyday Smoker  . Smokeless tobacco: Not on file  . Alcohol Use: No     former drinker      Review of Systems  Constitutional: Negative for fever, chills and activity change.  HENT: Negative for ear pain, congestion,  rhinorrhea, neck pain and sinus pressure.   Eyes: Negative for pain, discharge and redness.  Respiratory: Positive for shortness of breath. Negative for cough, chest tightness and wheezing.   Cardiovascular: Positive for chest pain. Negative for palpitations and leg swelling.  Gastrointestinal: Negative for nausea and vomiting.  Musculoskeletal: Positive for arthralgias.  Skin: Negative for color change and rash.  Neurological: Negative for dizziness, syncope, weakness, light-headedness, numbness and headaches.    Allergies  Prednisone  Home Medications   Current Outpatient Rx  Name Route Sig Dispense Refill  . ACETAMINOPHEN 325 MG PO TABS Oral Take 650 mg by mouth every 6 (six) hours as needed.      . ALBUTEROL SULFATE HFA 108 (90 BASE) MCG/ACT IN AERS Inhalation Inhale 2 puffs into the lungs every 6 (six) hours as needed. WHEEZING     . ALLOPURINOL 300 MG PO TABS Oral Take 450 mg by mouth daily. Pt takes 1/2  tab     . VITAMIN C 1000 MG PO TABS Oral Take 1,000 mg by mouth daily.      . ASPIRIN 81 MG PO TABS Oral Take 81 mg by mouth daily.      . COLCHICINE 0.6 MG PO TABS Oral Take 0.6 mg by mouth 2 (two) times daily.      Marland Kitchen DEPAKOTE PO Oral Take 2 tablets by mouth at bedtime. PT  STATES THAT HE'S TAKING THIS MEDICATION. CAN'T CONFIRM THE DOSAGE ON THIS RX.     Marland Kitchen GEMFIBROZIL 600 MG PO TABS Oral Take 600 mg by mouth daily.      Marland Kitchen HYDROCHLOROTHIAZIDE 25 MG PO TABS Oral Take 25 mg by mouth daily.      . OMEGA 3 1000 MG PO CAPS Oral Take 2,000 mg by mouth 2 (two) times daily.      Marland Kitchen OMEPRAZOLE 20 MG PO CPDR Oral Take 40 mg by mouth 2 (two) times daily.      Marland Kitchen SIMVASTATIN 20 MG PO TABS Oral Take 20 mg by mouth at bedtime.      . TRAMADOL HCL 50 MG PO TABS Oral Take 100 mg by mouth every 6 (six) hours as needed. Maximum dose= 8 tablets per day PAIN       BP 113/81  Pulse 75  Temp(Src) 98.9 F (37.2 C) (Oral)  Resp 16  SpO2 100%  Physical Exam  Nursing note and vitals  reviewed. Constitutional: He is oriented to person, place, and time. He appears well-developed and well-nourished. No distress.  HENT:  Head: Normocephalic and atraumatic.  Right Ear: External ear normal.  Left Ear: External ear normal.  Nose: Nose normal.  Eyes: Conjunctivae are normal. Pupils are equal, round, and reactive to light.  Neck: Normal range of motion.  Cardiovascular: Normal rate, regular rhythm, normal heart sounds and intact distal pulses.  Exam reveals no gallop and no friction rub.   No murmur heard. Pulmonary/Chest: Effort normal and breath sounds normal. No respiratory distress. He has no wheezes.  Abdominal: Soft. There is no tenderness. There is no rebound and no guarding.  Musculoskeletal: Normal range of motion.       Left knee: Well healing surgical scar noted to the medial aspect of the knee, extending inferiorly midway down the lower leg. Pt states this is from fem-pop bypass which was performed in Oct of this year. Tender to palpation over scar at medial aspect of knee. FROM, no deformity, ecchymoses, or edema/effusion noted. Negative drawer/McMurray's testing. LE neurovascularly intact.  Neurological: He is alert and oriented to person, place, and time.  Skin: Skin is warm and dry. No rash noted. He is not diaphoretic.  Psychiatric: He has a normal mood and affect.    ED Course  Procedures (including critical care time)   Date: 12/20/2011  Rate: 86  Rhythm: normal sinus rhythm  QRS Axis: normal  Intervals: normal and possible LAA  ST/T Wave abnormalities: normal  Conduction Disutrbances:none  Narrative Interpretation:   Old EKG Reviewed: no significant changes as compared with April 2011    Labs Reviewed  CBC - Abnormal; Notable for the following:    Hemoglobin 11.7 (*)    HCT 34.7 (*)    All other components within normal limits  COMPREHENSIVE METABOLIC PANEL - Abnormal; Notable for the following:    Sodium 134 (*)    All other components within  normal limits  DIFFERENTIAL  CARDIAC PANEL(CRET KIN+CKTOT+MB+TROPI)  POCT I-STAT TROPONIN I  POCT I-STAT TROPONIN I  LAB REPORT - SCANNED   Dg Chest 1 View  12/20/2011  *RADIOLOGY REPORT*  Clinical Data: Repeat chest radiograph with nipple markers.  CHEST - 1 VIEW  Comparison: Two-view chest radiograph from the same day.  Findings: The focal density on the right does correspond with a nipple shadow.  No other focal nodules are present.  Heart size is normal.  Lungs are clear.  IMPRESSION:  1.  The previously noted density corresponds with a nipple shadow. 2.  No acute or focal abnormality otherwise.  Original Report Authenticated By: Jamesetta Orleans. MATTERN, M.D.   Dg Chest 2 View  12/20/2011  *RADIOLOGY REPORT*  Clinical Data: Chest pain.  CHEST - 2 VIEW  Comparison: 09/20/2009.  Findings: Trachea is midline.  Heart size stable.  A probable nipple shadow projects over the lower right hemithorax.  Lungs are otherwise clear.  No pleural fluid.  Degenerative changes are seen in the spine.  IMPRESSION: Probable nipple shadow projecting over the lower right hemithorax. However, is not visualized on multiple prior exams and therefore pulmonary nodule cannot be excluded.  A repeat exam with nipple markers could be performed in further initial evaluation, as clinically indicated. These results were called by telephone on 12/20/2011  at  1058 hours to  Clydie Braun, the patient's [facility] ED nurse, who verbally acknowledged these results.  Original Report Authenticated By: Reyes Ivan, M.D.   Dg Knee Complete 4 Views Left  12/20/2011  *RADIOLOGY REPORT*  Clinical Data: Medial knee pain.  LEFT KNEE - COMPLETE 4+ VIEW  Comparison: None.  Findings: No definite joint effusion.  No fracture.  Minimal patellofemoral osteophytosis.  Vascular calcifications.  IMPRESSION: No definite joint effusion or fracture.  Minimal patellofemoral osteophytosis.  Original Report Authenticated By: Reyes Ivan, M.D.     1. Knee  pain, left       MDM  10:37 AM Pt assessed. His chief complaint is actually chest pain, which started last evening, and not knee pain; however, he seems considerably more concerned about his knee. He is s/p CABG and continues to smoke. Enzymes ordered. ECG with no remarkable change from 2011. Nitropaste ordered; pt took 650 of ASA last night. Will follow closely.  Patient's troponins were negative x2. Chest x-ray, EKG, labs unremarkable, which significantly lowers suspicion for ACS. His knee x-ray was also unremarkable. He was given a sleeve for support and instructions to follow up with orthopedics if his pain persists. Patient verbalized understanding and agreed to plan.      Grant Fontana, [place] 12/21/11 1219

## 2011-12-20 NOTE — ED Notes (Signed)
Chest pain started last night at 9:25 while having argument. Left sided radiating to neck--hx CABG 2009-pain never goes completely away, since last night. Pressure in chest, "sort of feels like indigestion" took ASA prior to arrival.

## 2011-12-20 NOTE — ED Notes (Signed)
Pt in xray

## 2011-12-20 NOTE — ED Notes (Signed)
Per ems: pt slipped and fell, fell on left knee. C/o pain. No swelling or deformity per ems.

## 2011-12-20 NOTE — ED Notes (Signed)
Ortho tech notified about knee sleeve before discharge.

## 2011-12-20 NOTE — ED Notes (Signed)
Father at bedside-- chest pain a "6" currently. Knee pain at a "7".

## 2011-12-23 NOTE — ED Provider Notes (Signed)
Medical screening examination/treatment/procedure(s) were performed by non-physician practitioner and as supervising physician I was immediately available for consultation/collaboration.  Raeford Razor, MD 12/23/11 [id number]

## 2012-03-04 ENCOUNTER — Encounter (HOSPITAL_COMMUNITY): Payer: Self-pay

## 2012-03-04 ENCOUNTER — Inpatient Hospital Stay (HOSPITAL_COMMUNITY)
Admission: EM | Admit: 2012-03-04 | Discharge: 2012-03-04 | DRG: 282 | Discharge status: Against Medical Advice | Payer: Non-veteran care | Source: Ambulatory Visit | Attending: Cardiovascular Disease | Admitting: Cardiovascular Disease

## 2012-03-04 ENCOUNTER — Other Ambulatory Visit: Payer: Self-pay

## 2012-03-04 ENCOUNTER — Encounter (HOSPITAL_COMMUNITY)
Admission: EM | Discharge status: Against Medical Advice | Payer: Self-pay | Source: Ambulatory Visit | Attending: Cardiovascular Disease

## 2012-03-04 DIAGNOSIS — Z951 Presence of aortocoronary bypass graft: Secondary | ICD-10-CM

## 2012-03-04 DIAGNOSIS — I213 ST elevation (STEMI) myocardial infarction of unspecified site: Secondary | ICD-10-CM | POA: Diagnosis present

## 2012-03-04 DIAGNOSIS — Z955 Presence of coronary angioplasty implant and graft: Secondary | ICD-10-CM

## 2012-03-04 DIAGNOSIS — E785 Hyperlipidemia, unspecified: Secondary | ICD-10-CM | POA: Insufficient documentation

## 2012-03-04 DIAGNOSIS — Z5900 Homelessness unspecified: Secondary | ICD-10-CM

## 2012-03-04 DIAGNOSIS — Z9861 Coronary angioplasty status: Secondary | ICD-10-CM

## 2012-03-04 DIAGNOSIS — I25810 Atherosclerosis of coronary artery bypass graft(s) without angina pectoris: Secondary | ICD-10-CM | POA: Diagnosis present

## 2012-03-04 DIAGNOSIS — I2510 Atherosclerotic heart disease of native coronary artery without angina pectoris: Secondary | ICD-10-CM | POA: Diagnosis present

## 2012-03-04 DIAGNOSIS — Z9111 Patient's noncompliance with dietary regimen: Secondary | ICD-10-CM

## 2012-03-04 DIAGNOSIS — F17200 Nicotine dependence, unspecified, uncomplicated: Secondary | ICD-10-CM | POA: Diagnosis present

## 2012-03-04 DIAGNOSIS — I2119 ST elevation (STEMI) myocardial infarction involving other coronary artery of inferior wall: Principal | ICD-10-CM | POA: Diagnosis present

## 2012-03-04 DIAGNOSIS — I2589 Other forms of chronic ischemic heart disease: Secondary | ICD-10-CM | POA: Diagnosis present

## 2012-03-04 DIAGNOSIS — Z91199 Patient's noncompliance with other medical treatment and regimen due to unspecified reason: Secondary | ICD-10-CM

## 2012-03-04 DIAGNOSIS — F319 Bipolar disorder, unspecified: Secondary | ICD-10-CM | POA: Diagnosis present

## 2012-03-04 DIAGNOSIS — I739 Peripheral vascular disease, unspecified: Secondary | ICD-10-CM | POA: Diagnosis present

## 2012-03-04 DIAGNOSIS — B1920 Unspecified viral hepatitis C without hepatic coma: Secondary | ICD-10-CM | POA: Diagnosis present

## 2012-03-04 DIAGNOSIS — Z9119 Patient's noncompliance with other medical treatment and regimen: Secondary | ICD-10-CM

## 2012-03-04 DIAGNOSIS — B1710 Acute hepatitis C without hepatic coma: Secondary | ICD-10-CM | POA: Insufficient documentation

## 2012-03-04 DIAGNOSIS — M109 Gout, unspecified: Secondary | ICD-10-CM | POA: Diagnosis present

## 2012-03-04 DIAGNOSIS — I255 Ischemic cardiomyopathy: Secondary | ICD-10-CM | POA: Diagnosis present

## 2012-03-04 HISTORY — PX: LEFT HEART CATHETERIZATION WITH CORONARY ANGIOGRAM: SHX5451

## 2012-03-04 HISTORY — DX: Presence of coronary angioplasty implant and graft: Z95.5

## 2012-03-04 HISTORY — DX: Atherosclerotic heart disease of native coronary artery without angina pectoris: I25.10

## 2012-03-04 LAB — MRSA PCR SCREENING: MRSA by PCR: NEGATIVE

## 2012-03-04 LAB — CBC
Hemoglobin: 11.3 g/dL — ABNORMAL LOW (ref 13.0–17.0)
Platelets: 158 10*3/uL (ref 150–400)
RBC: 4.09 MIL/uL — ABNORMAL LOW (ref 4.22–5.81)
WBC: 6.6 10*3/uL (ref 4.0–10.5)

## 2012-03-04 LAB — COMPREHENSIVE METABOLIC PANEL
ALT: 10 U/L (ref 0–53)
AST: 12 U/L (ref 0–37)
Alkaline Phosphatase: 85 U/L (ref 39–117)
CO2: 22 mEq/L (ref 19–32)
Chloride: 105 mEq/L (ref 96–112)
GFR calc non Af Amer: >90 mL/min (ref >90)
Glucose, Bld: 129 mg/dL — ABNORMAL HIGH (ref 70–99)
Sodium: 137 mEq/L (ref 135–145)
Total Bilirubin: 0.3 mg/dL (ref 0.3–1.2)

## 2012-03-04 LAB — CARDIAC PANEL(CRET KIN+CKTOT+MB+TROPI)
CK, MB: 4.3 ng/mL — ABNORMAL HIGH (ref 0.3–4.0)
CK, MB: 4.6 ng/mL — ABNORMAL HIGH (ref 0.3–4.0)
Relative Index: INVALID (ref 0.0–2.5)
Relative Index: INVALID (ref 0.0–2.5)
Total CK: 77 U/L (ref 7–232)
Total CK: 73 U/L (ref 7–232)
Troponin I: 0.58 ng/mL (ref <0.30)
Troponin I: 0.65 ng/mL (ref <0.30)
Troponin I: 0.65 ng/mL (ref <0.30)

## 2012-03-04 LAB — LIPID PANEL
LDL Cholesterol: UNDETERMINED mg/dL (ref 0–99)
Total CHOL/HDL Ratio: 6.9 RATIO
VLDL: UNDETERMINED mg/dL (ref 0–40)

## 2012-03-04 LAB — POCT I-STAT, CHEM 8
Hemoglobin: 11.2 g/dL — ABNORMAL LOW (ref 13.0–17.0)
Sodium: 139 mEq/L (ref 135–145)
TCO2: 22 mmol/L (ref 0–100)

## 2012-03-04 LAB — PROTIME-INR
INR: 1.05 (ref 0.00–1.49)
Prothrombin Time: 13.9 seconds (ref 11.6–15.2)

## 2012-03-04 SURGERY — LEFT HEART CATHETERIZATION WITH CORONARY ANGIOGRAM
Anesthesia: LOCAL

## 2012-03-04 MED ORDER — MORPHINE SULFATE 2 MG/ML IJ SOLN: 1.0000 mg | INTRAMUSCULAR | Status: DC | PRN

## 2012-03-04 MED ORDER — TRAMADOL HCL 50 MG PO TABS
50.0000 mg | ORAL_TABLET | Freq: Four times a day (QID) | ORAL | Status: DC | PRN
  Filled 2012-03-04: qty 1

## 2012-03-04 MED ORDER — NITROGLYCERIN 0.2 MG/ML ON CALL CATH LAB
INTRAVENOUS | Status: AC
  Filled 2012-03-04: qty 1

## 2012-03-04 MED ORDER — METOPROLOL TARTRATE 25 MG PO TABS
25.0000 mg | ORAL_TABLET | Freq: Two times a day (BID) | ORAL | Status: DC
  Administered 2012-03-04: 25 mg via ORAL
  Filled 2012-03-04: qty 1

## 2012-03-04 MED ORDER — CLOPIDOGREL BISULFATE 300 MG PO TABS
ORAL_TABLET | ORAL | Status: AC
  Filled 2012-03-04: qty 2

## 2012-03-04 MED ORDER — SODIUM CHLORIDE 0.9 % IV SOLN
INTRAVENOUS | Status: AC
  Administered 2012-03-04: 1000 mL via INTRAVENOUS

## 2012-03-04 MED ORDER — ATROPINE SULFATE 1 MG/ML IJ SOLN
INTRAMUSCULAR | Status: AC
  Filled 2012-03-04: qty 1

## 2012-03-04 MED ORDER — FAMOTIDINE IN NACL 20-0.9 MG/50ML-% IV SOLN
INTRAVENOUS | Status: AC
  Filled 2012-03-04: qty 50

## 2012-03-04 MED ORDER — ASPIRIN EC 325 MG PO TBEC
325.0000 mg | DELAYED_RELEASE_TABLET | Freq: Every day | ORAL | Status: DC
  Filled 2012-03-04 (×2): qty 1

## 2012-03-04 MED ORDER — LIDOCAINE HCL (PF) 1 % IJ SOLN
INTRAMUSCULAR | Status: AC
  Filled 2012-03-04: qty 30

## 2012-03-04 MED ORDER — ACETAMINOPHEN 325 MG PO TABS: 650.0000 mg | ORAL_TABLET | ORAL | Status: DC | PRN

## 2012-03-04 MED ORDER — CLOPIDOGREL BISULFATE 75 MG PO TABS: 75.0000 mg | ORAL_TABLET | Freq: Every day | ORAL | Status: DC

## 2012-03-04 MED ORDER — ZOLPIDEM TARTRATE 5 MG PO TABS: 10.0000 mg | ORAL_TABLET | Freq: Every evening | ORAL | Status: DC | PRN

## 2012-03-04 MED ORDER — BIVALIRUDIN 250 MG IV SOLR
INTRAVENOUS | Status: AC
  Filled 2012-03-04: qty 250

## 2012-03-04 MED ORDER — ALPRAZOLAM 0.5 MG PO TABS: 0.5000 mg | ORAL_TABLET | Freq: Three times a day (TID) | ORAL | Status: DC | PRN

## 2012-03-04 MED ORDER — HEPARIN (PORCINE) IN NACL 2-0.9 UNIT/ML-% IJ SOLN
INTRAMUSCULAR | Status: AC
  Filled 2012-03-04: qty 2000

## 2012-03-04 MED ORDER — ONDANSETRON HCL 4 MG/2ML IJ SOLN: 4.0000 mg | Freq: Four times a day (QID) | INTRAMUSCULAR | Status: DC | PRN

## 2012-03-04 MED ORDER — ATORVASTATIN CALCIUM 40 MG PO TABS
40.0000 mg | ORAL_TABLET | Freq: Every day | ORAL | Status: DC
  Administered 2012-03-04: 40 mg via ORAL
  Filled 2012-03-04: qty 1

## 2012-03-04 NOTE — H&P (Signed)
    Pt was reexamined and existing H & P reviewed. No changes found.  Runell Gess, MD [facility] 03/04/2012 12:28 PM

## 2012-03-04 NOTE — H&P (Signed)
Patient ID: [name] MRN: [id number], DOB/AGE: [birth year]   Admit date: 03/04/2012   Primary Physician: User Centricity, MD, MD Primary Cardiologist: Dr Allyson Sabal  HPI: 56 y/o with a history of CAD s/p CABG X 27 March 2008. He has not had a functional study since as far as I can tell. He had been followed at the [facility]. He is homeless and living in an abandoned house. Today [facility] was [facility] free clinic having a nurse check his Lt leg FPBPG site when he developed SSCP and EMS was called, this was around 10:00-10:30 this AM. He had inferior ST elevation and STEMI was activated. He was taken urgently to the cath lab by Dr Allyson Sabal, see his note for complete details. The patient says he has been walking miles daily without problems till this am.   Problem List: Past Medical History  Diagnosis Date  . Bipolar 2 disorder   . Gout   . Coronary artery disease     Past Surgical History  Procedure Date  . Coronary stent placement   . Coronary artery bypass graft   . Cholecystectomy      Allergies:  Allergies  Allergen Reactions  . Prednisone Other (See Comments)    Causes steroid rage     Home Medications Prescriptions prior to admission  Medication Sig Dispense Refill  . albuterol (PROVENTIL HFA;VENTOLIN HFA) 108 (90 BASE) MCG/ACT inhaler Inhale 2 puffs into the lungs every 6 (six) hours as needed. WHEEZING       . allopurinol (ZYLOPRIM) 300 MG tablet Take 450 mg by mouth daily. Pt takes 1/2  tab      . Ascorbic Acid (VITAMIN C) 1000 MG tablet Take 1,000 mg by mouth daily.        Marland Kitchen aspirin 81 MG tablet Take 81 mg by mouth daily.        . colchicine 0.6 MG tablet Take 0.6 mg by mouth 2 (two) times daily.       . fenofibrate 160 MG tablet Take 160 mg by mouth daily.      . ferrous sulfate 325 (65 FE) MG tablet Take 325 mg by mouth daily with breakfast.      . gemfibrozil (LOPID) 600 MG tablet Take 600 mg by mouth daily.        . hydrochlorothiazide (HYDRODIURIL) 25 MG tablet Take 25 mg  by mouth daily.        . OMEGA 3 1000 MG CAPS Take 2,000 mg by mouth 2 (two) times daily.        Marland Kitchen omeprazole (PRILOSEC) 20 MG capsule Take 40 mg by mouth 2 (two) times daily.        . simvastatin (ZOCOR) 20 MG tablet Take 20 mg by mouth at bedtime.        . traMADol (ULTRAM) 50 MG tablet Take 100 mg by mouth every 6 (six) hours as needed. Maximum dose= 8 tablets per day PAIN          No family history on file.   History   Social History  . Marital Status: Divorced    Spouse Name: N/A    Number of Children: N/A  . Years of Education: N/A   Occupational History  . Not on file.   Social History Main Topics  . Smoking status: Current Everyday Smoker -- 1.0 packs/day    Types: Cigarettes  . Smokeless tobacco: Not on file  . Alcohol Use: No     former drinker  .  Drug Use: Yes    Special: Marijuana  . Sexually Active: Not Currently   Other Topics Concern  . Not on file   Social History Narrative  . No narrative on file     Review of Systems: General: negative for chills, fever, night sweats or weight changes.  Cardiovascular: negative for chest pain, dyspnea on exertion, edema, orthopnea, palpitations, paroxysmal nocturnal dyspnea or shortness of breath Dermatological: negative for rash Respiratory: negative for cough or wheezing Urologic: negative for hematuria Abdominal: negative for nausea, vomiting, diarrhea, bright red blood per rectum, melena, or hematemesis Neurologic: negative for visual changes, syncope, or dizziness All other systems reviewed and are otherwise negative except as noted above.  Physical Exam: Blood pressure 110/63, pulse 88, temperature 98.6 F (37 C), temperature source Oral, resp. rate 14, height 5\' 9"  (1.753 m), weight 72.984 kg (160 lb 14.4 oz), SpO2 99.00%.  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 Pulses: 2+ and symmetric 2+/4 on Rt Skin: Skin color, texture, turgor normal. No rashes or lesions Neurologic: Grossly normal    Labs:   Results for orders placed during the hospital encounter of 03/04/12 (from the past 24 hour(s))  CBC     Status: Abnormal   Collection Time   03/04/12 11:45 AM      Component Value Range   WBC 6.6  4.0 - 10.5 (K/uL)   RBC 4.09 (*) 4.22 - 5.81 (MIL/uL)   Hemoglobin 11.3 (*) 13.0 - 17.0 (g/dL)   HCT 96.0 (*) 45.4 - 52.0 (%)   MCV 80.0  78.0 - 100.0 (fL)   MCH 27.6  26.0 - 34.0 (pg)   MCHC 34.6  30.0 - 36.0 (g/dL)   RDW 09.8 (*) 11.9 - 15.5 (%)   Platelets 158  150 - 400 (K/uL)  COMPREHENSIVE METABOLIC PANEL     Status: Abnormal   Collection Time   03/04/12 11:45 AM      Component Value Range   Sodium 137  135 - 145 (mEq/L)   Potassium 3.8  3.5 - 5.1 (mEq/L)   Chloride 105  96 - 112 (mEq/L)   CO2 22  19 - 32 (mEq/L)   Glucose, Bld 129 (*) 70 - 99 (mg/dL)   BUN 12  6 - 23 (mg/dL)   Creatinine, Ser 1.47  0.50 - 1.35 (mg/dL)   Calcium 9.0  8.4 - 82.9 (mg/dL)   Total Protein 6.0  6.0 - 8.3 (g/dL)   Albumin 3.4 (*) 3.5 - 5.2 (g/dL)   AST 12  0 - 37 (U/L)   ALT 10  0 - 53 (U/L)   Alkaline Phosphatase 85  39 - 117 (U/L)   Total Bilirubin 0.3  0.3 - 1.2 (mg/dL)   GFR calc non Af Amer >90  >90 (mL/min)   GFR calc Af Amer >90  >90 (mL/min)  LIPID PANEL     Status: Abnormal   Collection Time   03/04/12 11:45 AM      Component Value Range   Cholesterol 214 (*) 0 - 200 (mg/dL)   Triglycerides 562 (*) <150 (mg/dL)   HDL 31 (*) >13 (mg/dL)   Total CHOL/HDL Ratio 6.9     VLDL UNABLE TO CALCULATE IF TRIGLYCERIDE OVER 400 mg/dL  0 - 40 (mg/dL)  LDL Cholesterol UNABLE TO CALCULATE IF TRIGLYCERIDE OVER 400 mg/dL  0 - 99 (mg/dL)  PROTIME-INR     Status: Normal   Collection Time   03/04/12 11:45 AM      Component Value Range     Prothrombin Time 13.9  11.6 - 15.2 (seconds)   INR 1.05  0.00 - 1.49   APTT     Status: Normal   Collection Time   03/04/12 11:45 AM      Component Value Range   aPTT 33  24 - 37 (seconds)  CARDIAC PANEL(CRET KIN+CKTOT+MB+TROPI)     Status: Abnormal   Collection Time   03/04/12 11:45 AM      Component Value Range   Total CK 88  7 - 232 (U/L)   CK, MB 4.8 (*) 0.3 - 4.0 (ng/mL)   Troponin I 0.58 (*) <0.30 (ng/mL)   Relative Index RELATIVE INDEX IS INVALID  0.0 - 2.5   CARDIAC PANEL(CRET KIN+CKTOT+MB+TROPI)     Status: Abnormal   Collection Time   03/04/12  1:20 PM      Component Value Range   Total CK 77  7 - 232 (U/L)   CK, MB 4.3 (*) 0.3 - 4.0 (ng/mL)   Troponin I 0.65 (*) <0.30 (ng/mL)   Relative Index RELATIVE INDEX IS INVALID  0.0 - 2.5   MRSA PCR SCREENING     Status: Normal   Collection Time   03/04/12  1:24 PM      Component Value Range   MRSA by PCR NEGATIVE  NEGATIVE      Radiology/Studies: No results found.  EKG:NSR, Inf STEMI  ASSESSMENT AND PLAN:   Principal Problem:  *STEMI (ST elevation myocardial infarction)  Active Problems:  CAD, CABG X 27 March 2008  Bipolar 1 disorder  Homeless  HEPATITIS C  HYPERLIPIDEMIA  Noncompliance with diet and medication regimen  Smoker  PVD, Lt FPBPG at [facility] Oct 2012  Ischemic cardiomyopathy, EF 40% 2009  Plan- Urgent cath  SignedAbelino Derrick, PA-C 03/04/2012, 4:39 PM

## 2012-03-04 NOTE — Progress Notes (Signed)
Patient nurse was notified by another nurse that patient had clothes on and was leaving AMA. Talked to the patient about the risks of leaving the hospital without further treatment. Talked patient into going back to the room to check PIV site that he had removed and checked Right Groin site with pressure dressing intact. PIV site on right forearm was not bleeding and patient did not want a dressing over the site. Right groin was soft and without signs of hematoma or bleeding. Patient was in a hurry to catch the last bus leaving from the area. Was able to instruct patient on care of site and to hold pressure on site and call for help if bleeding occurs. He was also informed on the importance of taking a medication that prevents clots from forming where stents are placed. He was informed by two nurses that by not taking the medication could cause him to develop a clot around that stent and he could have a heart attack and die. He stated that he was going to the [place] in the morning at 10:00 am and he would tell them. He was also informed that if he should have chest pain to call 911 right away. He was read the AMA document and understood the risk of leaving against medical advice. He signed the document and we escorted him in the right direction to catch the bus. The [place] PA was notified.

## 2012-03-04 NOTE — Op Note (Signed)
[name] is a 56 y.o. male    [id number] LOCATION:  FACILITY: [facility]  PHYSICIAN: Nanetta Batty, M.D. March 26, 1956   DATE OF PROCEDURE:  03/04/2012  DATE OF DISCHARGE:  [facility]  CARDIAC CATHETERIZATION     History obtained from chart review. [name] is a 56 year old Caucasian male who had coronary artery bypass grafting back in April of 2009 after he presented with a MI. The problems include a potential hyperlipidemia. Has continued to smoke. He had the onset of chest pain 45 minutes prior to presenting to come hospital where his EKG showed that he was experiencing at an inferior ST segment elevation myocardial infarction. He was brought urgently to the cath lab for cardiac catheterization and potential intervention.   PROCEDURE DESCRIPTION:    The patient was brought to the second floor  [place] [facility]iac cath lab in the postabsorptive state. He was not  premedicated . His right groin was prepped and shaved in usual sterile fashion. Xylocaine 1% was used  for local anesthesia. A 6 French sheath was inserted into the right common femoral  artery using standard Seldinger technique. 6 French right and left Judkins diagnostic catheters along with a 6 French pigtail catheter and left bypass graft catheter were used for selective coronary angiography, selective vein graft and IMA angiography and left ventriculography. Visipaque dye was used for the entirety of the case. Retrograde aortic, left ventricular and pullback pressures were recorded.    HEMODYNAMICS:    AO SYSTOLIC/AO DIASTOLIC: 124/62   LV SYSTOLIC/LV DIASTOLIC: 126/17  ANGIOGRAPHIC RESULTS:   1. Left main; normal  2. LAD; 80% mid at the takeoff of the second moderate size diagonal branch. There was competitive flow distally consistent with an intact mammary graft. The second diagonal branch had a 90% segmental proximal stenosis. 3. Left circumflex; 50% hypodense proximal in the AV  groove, 80% segmental mid in the AV groove. There was competitive flow in the first marginal branch which was bypassed..  4. Right coronary artery; dominant with occlusion at the genu of the vessel 5.LIMA TO LAD; patent 6. Left radial graft to circumflex obtuse marginal branch was patent     SVG TO PDA was subtotally occluded secondary to thrombosis at the proximal portion. There was a large patulous vein graft that was disproportionately sized to the distal vessel.    7. Left ventriculography; RAO left ventriculogram was performed using  25 mL of Visipaque dye at 12 mL/second. The overall LVEF estimated  35-40 %  With wall motion abnormalities notable for moderate to severe inferior hypokinesia and mild anterolateral hypokinesia  IMPRESSION:[name] has a thrombotic lead, subtotally occluded SVG to the PDA. He did have TIMI 3 flow. His chest pain had resolved by the time of catheter site the second elevation had resolved as well. We'll proceed with aspiration thrombectomy, PCI stenting using bare-metal stent, Angiomax and Plavix  Procedure description: The patient received 600 mg of Plavix by mouth. He received aspirin in route and EMS. Received Intimax bolus and ACT 358. A total of 165 cc of contrast was administered to the patient. Using a 6 [place] JR 4 guide catheter along with an 014/190 cm length length Asahi soft wire and a clear way aspiration thrombectomy catheter aspiration was performed. angiography revealed marked improvement in the angiographic appearance. Following this predilatation was performed with a 2.5 mm x 12 mm long  Emerge balloon at nominal pressures. Stenting was then performed with a 2.75 mm x 14  mm long integrity bare-metal stent at 14 atmospheres (3.0 mm). The final angiographic result with reduction of 95% subtotally and thrombotic occluded RCA SVG to 0% residual with excellent flow. All distal vessels were intact. The patient tolerated the procedure well. He is pain-free at  the end of the case. The guidewire and catheter were removed and the sheath was then securely in place.  Final impression: Successful RCA SVG aspiration thrombectomy, PCI, and stenting using bare-metal stent, aspirin/Plavix and Angiomax. The patient will be issued with a beta blocker, statin drug and ACE inhibitors well. smoking cessation counseling will be obtained. Dr Rinaldo Cloud  he was on unassigned call and will assume the care of the patient in the coronary care unit. She left the lab in stable condition.  Runell Gess MD, [facility] 03/04/2012 12:33 PM

## 2012-03-04 NOTE — Progress Notes (Signed)
Subjective:  Patient states feeling better after PTCA stenting to saphenous vein graft to RCA. Denies any chest pain or shortness of breath  Objective:  Vital Signs in the last 24 hours: Temp:  [98.1 F (36.7 C)-98.9 F (37.2 C)] 98.9 F (37.2 C) (03/12 1959) Pulse Rate:  [71-88] 86  (03/12 1959) Resp:  [11-18] 15  (03/12 1959) BP: (110-140)/(59-92) 118/81 mmHg (03/12 1800) SpO2:  [97 %-100 %] 97 % (03/12 1959) Weight:  [72.984 kg (160 lb 14.4 oz)] 72.984 kg (160 lb 14.4 oz) (03/12 1300)  Intake/Output from previous day:   Intake/Output from this shift:    Physical Exam: Neck: no adenopathy, no carotid bruit, no JVD and supple, symmetrical, trachea midline Lungs: clear to auscultation bilaterally Heart: regular rate and rhythm, S1, S2 normal and no click Abdomen: soft, non-tender; bowel sounds normal; no masses,  no organomegaly Extremities: extremities normal, atraumatic, no cyanosis or edema Right groin stable Lab Results:  Basename 03/04/12 1148 03/04/12 1145  WBC -- 6.6  HGB 11.2* 11.3*  PLT -- 158    Basename 03/04/12 1148 03/04/12 1145  NA 139 137  K 3.8 3.8  CL 107 105  CO2 -- 22  GLUCOSE 134* 129*  BUN 12 12  CREATININE 0.70 0.68    Basename 03/04/12 1746 03/04/12 1320  TROPONINI 0.65* 0.65*   Hepatic Function Panel  Basename 03/04/12 1145  PROT 6.0  ALBUMIN 3.4*  AST 12  ALT 10  ALKPHOS 85  BILITOT 0.3  BILIDIR --  IBILI --    Basename 03/04/12 1145  CHOL 214*   No results found for this basename: PROTIME in the last 72 hours  Imaging: Imaging results have been reviewed and No results found.  Cardiac Studies:  Assessment/Plan:  Status post and true wall myocardial infarction status post aspiration thrombectomy and PTCA stenting to saphenous vein graft to RCA by Dr. Allyson Sabal Coronary artery disease history of MI in the past status post CABG Ischemic cardiomyopathy Hypertension Hypercholesteremia Peripheral vascular  disease Diverticulosis Bipolar disorder History of gouty arthritis Plan Continue present management Will add ACE inhibitors tomorrow if BP stable and renal function stable  LOS: 0 days    Ferrah Panagopoulos N 03/04/2012, 8:14 PM

## 2012-03-04 NOTE — Progress Notes (Signed)
Rt arterial sheath removed.  Pt tolerated procedure well.  Rt groin level 0 prior to sheath pull.  Manual pressure held for 20 minutes.  No hematoma or complications noted.  Pt instructed to apply pressure to site if he coughed, sneezed or laughed.  Pt demonstrated successfully.  Rt DP pulse +1 pre and post procedure.  Groin site level 0, after sheath pulled and pressure applied.  Eliane Decree, RN, 03/04/2012, 6:55 PM

## 2012-03-05 LAB — POCT ACTIVATED CLOTTING TIME: Activated Clotting Time: 358 seconds

## 2012-03-05 MED FILL — Dextrose Inj 5%: INTRAVENOUS | Qty: 50 | Status: AC

## 2012-03-07 NOTE — Discharge Summary (Signed)
Patient ID: [name],  MRN: [id number], DOB/AGE: 58-Jun-1957 58 y.o.  Admit date: 03/04/2012 Discharge date: 03/07/2012  Primary Care Provider: none [facility]: [facility]  Discharge Diagnoses Principal Problem:  *STEMI (ST elevation myocardial infarction) Active Problems:  CAD, CABG X 27 March 2008  Bipolar 1 disorder  Homeless  HEPATITIS C  HYPERLIPIDEMIA  Noncompliance with diet and medication regimen  Smoker  PVD, Lt FPBPG at [facility] Oct 2012  Ischemic cardiomyopathy, EF 40% 2009    Procedures: SVG-RCA aspiration thrombectomy and stenting with a BMS 03/04/12   Hospital Course: : 56 y/o with a history of CAD s/p CABG X 27 March 2008. He has not had a functional study since his CABG as far as I can tell. He had been followed at the [facility]. He is homeless and living in an abandoned house. Today he was at the free clinic having a nurse check his Lt leg FPBPG site when he developed SSCP and EMS was called, this was around 10:00-10:30 this AM. He had inferior ST elevation and STEMI was activated. He was taken urgently to the cath lab by Dr Allyson Sabal, see his note for complete details. The patient says he has been walking miles daily without problems till this am. The pt underwent urgent intervention to SVG-RCA. He tolerated this well. I saw him in CCU after the procedure and spent at least 45 minutes with the patient discussing his condition and plans to get him help with his medications and his current living situation. The patient was calm and receptive and it was a pleasant conversation. Later that night I was contacted by the RN on the unit informing me that the patient had left AMA a couple of hours after his sheath was pulled. She said he told her [facility]. She tried to dissuade him but was unsuccessful.   Discharge Vitals:  Blood pressure 111/47, pulse 87, temperature 98.9 F (37.2 C), temperature source Oral, resp. rate 14, height 5\' 9"  (1.753  m), weight 72.984 kg (160 lb 14.4 oz), SpO2 96.00%.    Labs: No results found for this or any previous visit (from the past 48 hour(s)).  Disposition:    Discharge Medications:  Medication List  As of 03/07/2012  5:46 PM   ASK your doctor about these medications         albuterol 108 (90 BASE) MCG/ACT inhaler   Commonly known as: PROVENTIL HFA;VENTOLIN HFA   Inhale 2 puffs into the lungs every 6 (six) hours as needed. WHEEZING      aspirin 81 MG tablet   Take 81 mg by mouth daily.      colchicine 0.6 MG tablet   Take 0.6 mg by mouth 2 (two) times daily.      fenofibrate 160 MG tablet   Take 160 mg by mouth daily.      ferrous sulfate 325 (65 FE) MG tablet   Take 325 mg by mouth daily with breakfast.      gemfibrozil 600 MG tablet   Commonly known as: LOPID   Take 600 mg by mouth daily.      hydrochlorothiazide 25 MG tablet   Commonly known as: HYDRODIURIL   Take 25 mg by mouth daily.      OMEGA 3 1000 MG Caps   Take 2,000 mg by mouth 2 (two) times daily.      omeprazole 20 MG capsule   Commonly known as: PRILOSEC  Take 40 mg by mouth 2 (two) times daily.      simvastatin 20 MG tablet   Commonly known as: ZOCOR   Take 20 mg by mouth at bedtime.      traMADol 50 MG tablet   Commonly known as: ULTRAM   Take 100 mg by mouth every 6 (six) hours as needed. Maximum dose= 8 tablets per day PAIN      vitamin C 1000 MG tablet   Take 1,000 mg by mouth daily.      ZYLOPRIM 300 MG tablet   Generic drug: allopurinol   Take 450 mg by mouth daily. Pt takes 1/2  tab            Disposition: Pt left AMA with no medications or prescriptions.  Jolene Provost PA-C 03/07/2012 5:46 PM

## 2012-03-19 ENCOUNTER — Emergency Department (HOSPITAL_COMMUNITY): Payer: Medicare Other

## 2012-03-19 ENCOUNTER — Other Ambulatory Visit: Payer: Self-pay

## 2012-03-19 ENCOUNTER — Encounter (HOSPITAL_COMMUNITY): Payer: Self-pay

## 2012-03-19 ENCOUNTER — Emergency Department (HOSPITAL_COMMUNITY)
Admission: EM | Admit: 2012-03-19 | Discharge: 2012-03-19 | Disposition: A | Discharge status: Home / Self Care | Payer: Medicare Other | Attending: Emergency Medicine | Admitting: Emergency Medicine

## 2012-03-19 DIAGNOSIS — I2510 Atherosclerotic heart disease of native coronary artery without angina pectoris: Secondary | ICD-10-CM | POA: Insufficient documentation

## 2012-03-19 DIAGNOSIS — R079 Chest pain, unspecified: Secondary | ICD-10-CM | POA: Insufficient documentation

## 2012-03-19 DIAGNOSIS — L0390 Cellulitis, unspecified: Secondary | ICD-10-CM

## 2012-03-19 DIAGNOSIS — F3189 Other bipolar disorder: Secondary | ICD-10-CM | POA: Insufficient documentation

## 2012-03-19 DIAGNOSIS — I252 Old myocardial infarction: Secondary | ICD-10-CM | POA: Insufficient documentation

## 2012-03-19 DIAGNOSIS — Z79899 Other long term (current) drug therapy: Secondary | ICD-10-CM | POA: Insufficient documentation

## 2012-03-19 HISTORY — DX: Acute myocardial infarction, unspecified: I21.9

## 2012-03-19 LAB — CBC
HCT: 33.2 % — ABNORMAL LOW (ref 39.0–52.0)
Hemoglobin: 11.3 g/dL — ABNORMAL LOW (ref 13.0–17.0)
MCH: 28.2 pg (ref 26.0–34.0)
MCHC: 34 g/dL (ref 30.0–36.0)
MCV: 82.8 fL (ref 78.0–100.0)
Platelets: 186 10*3/uL (ref 150–400)
RBC: 4.01 MIL/uL — ABNORMAL LOW (ref 4.22–5.81)
RDW: 15.9 % — ABNORMAL HIGH (ref 11.5–15.5)
WBC: 5.8 10*3/uL (ref 4.0–10.5)

## 2012-03-19 LAB — BASIC METABOLIC PANEL
BUN: 11 mg/dL (ref 6–23)
CO2: 23 mEq/L (ref 19–32)
Calcium: 9.2 mg/dL (ref 8.4–10.5)
Chloride: 103 mEq/L (ref 96–112)
Creatinine, Ser: 0.69 mg/dL (ref 0.50–1.35)
GFR calc Af Amer: >90 mL/min (ref >90)
GFR calc non Af Amer: >90 mL/min (ref >90)
Glucose, Bld: 95 mg/dL (ref 70–99)
Potassium: 3.7 mEq/L (ref 3.5–5.1)
Sodium: 137 mEq/L (ref 135–145)

## 2012-03-19 LAB — TROPONIN I: Troponin I: <0.3 ng/mL (ref <0.30)

## 2012-03-19 MED ORDER — ASPIRIN 81 MG PO CHEW: 81.0000 mg | CHEWABLE_TABLET | Freq: Every day | ORAL | Status: AC

## 2012-03-19 MED ORDER — HYDROCHLOROTHIAZIDE 25 MG PO TABS: 25.0000 mg | ORAL_TABLET | Freq: Every day | ORAL | Status: DC

## 2012-03-19 MED ORDER — ASPIRIN 81 MG PO CHEW: 324.0000 mg | CHEWABLE_TABLET | Freq: Once | ORAL | Status: DC

## 2012-03-19 MED ORDER — GEMFIBROZIL 600 MG PO TABS: 600.0000 mg | ORAL_TABLET | Freq: Every day | ORAL | Status: DC

## 2012-03-19 MED ORDER — OMEGA-3-ACID ETHYL ESTERS 1 G PO CAPS: 2.0000 g | ORAL_CAPSULE | Freq: Two times a day (BID) | ORAL | Status: AC

## 2012-03-19 MED ORDER — SIMVASTATIN 20 MG PO TABS: 20.0000 mg | ORAL_TABLET | Freq: Every day | ORAL | Status: DC

## 2012-03-19 MED ORDER — SULFAMETHOXAZOLE-TRIMETHOPRIM 800-160 MG PO TABS: 1.0000 | ORAL_TABLET | Freq: Two times a day (BID) | ORAL | Status: AC

## 2012-03-19 NOTE — ED Notes (Signed)
Pt was brought in by EMS with c/o substernal CP radiating to the lt jaw onset an hour ago with SOB and nausea. Pt was given 4 NTG SL and 324 mg po of ASA.

## 2012-03-19 NOTE — ED Provider Notes (Signed)
History    56 year old male with chest pain. Patient has a known history of coronary disease. Patient was most recently admitted on March 12 with ST segment elevation myocardial infarction. HE underwent stenting of his right coronary artery. He subsequently left AMA shortly after the procedure. He has not followed up since then. He is not been taking his Plavix or other medications because of transportation issues and inability to get to [place] for f/u.Marland Kitchen Today he was having argument with his father and began experiencing substernal chest pain. Onset was about an hour prior to arrival. Was improved with nitroglycerin and is now almost completely resolved. No radiation. Was initially associated with some shortness of breath which is resolved as well. ASA given prehospital.   CSN: [id number]  Arrival date & time 03/19/12  1604   First MD Initiated Contact with Patient 03/19/12 1607      Chief Complaint  Patient presents with  . Chest Pain    (Consider location/radiation/quality/duration/timing/severity/associated sxs/prior treatment) HPI  Past Medical History  Diagnosis Date  . Bipolar 2 disorder   . Gout   . Coronary artery disease   . MI (myocardial infarction)     Past Surgical History  Procedure Date  . Coronary stent placement   . Cholecystectomy   . Coronary angioplasty   . Coronary artery bypass graft   . Femoral-popliteal bypass graft     No family history on file.  History  Substance Use Topics  . Smoking status: Current Everyday Smoker -- 1.0 packs/day    Types: Cigarettes  . Smokeless tobacco: Not on file  . Alcohol Use: Yes     former drinker      Review of Systems   Review of symptoms negative unless otherwise noted in HPI.   Allergies  Prednisone  Home Medications   Current Outpatient Rx  Name Route Sig Dispense Refill  . ALBUTEROL SULFATE HFA 108 (90 BASE) MCG/ACT IN AERS Inhalation Inhale 2 puffs into the lungs every 6 (six) hours as needed.  WHEEZING     . ALLOPURINOL 300 MG PO TABS Oral Take 450 mg by mouth daily. Pt takes 1/2  tab    . VITAMIN C 1000 MG PO TABS Oral Take 1,000 mg by mouth daily.      . ASPIRIN EC 81 MG PO TBEC Oral Take 81 mg by mouth daily.    . COLCHICINE 0.6 MG PO TABS Oral Take 0.6 mg by mouth 2 (two) times daily.     . FENOFIBRATE 160 MG PO TABS Oral Take 160 mg by mouth daily.    Marland Kitchen FERROUS SULFATE 325 (65 FE) MG PO TABS Oral Take 325 mg by mouth daily with breakfast.    . GEMFIBROZIL 600 MG PO TABS Oral Take 600 mg by mouth daily.      Marland Kitchen HYDROCHLOROTHIAZIDE 25 MG PO TABS Oral Take 25 mg by mouth daily.      . OMEGA-3-ACID ETHYL ESTERS 1 G PO CAPS Oral Take 2 g by mouth 2 (two) times daily.    Marland Kitchen OMEPRAZOLE 20 MG PO CPDR Oral Take 40 mg by mouth 2 (two) times daily.      Marland Kitchen SIMVASTATIN 20 MG PO TABS Oral Take 20 mg by mouth at bedtime.      . TRAMADOL HCL 50 MG PO TABS Oral Take 100 mg by mouth every 6 (six) hours as needed. For pain. Maximum dose= 8 tablets per day      BP 113/82  Pulse 89  Temp(Src) 98.3 F (36.8 C) (Oral)  Resp 16  Ht 5\' 9"  (1.753 m)  Wt 160 lb (72.576 kg)  BMI 23.63 kg/m2  SpO2 99%  Physical Exam  Nursing note and vitals reviewed. Constitutional: He appears well-developed and well-nourished. No distress.       Laying in bed. No acute distress the  HENT:  Head: Normocephalic and atraumatic.  Eyes: Conjunctivae are normal. Right eye exhibits no discharge. Left eye exhibits no discharge.  Neck: Neck supple.  Cardiovascular: Normal rate, regular rhythm and normal heart sounds.  Exam reveals no gallop and no friction rub.   No murmur heard. Pulmonary/Chest: Effort normal and breath sounds normal. No respiratory distress.       Chest pain is not reproducible with palpation.  Abdominal: Soft. He exhibits no distension. There is no tenderness.  Musculoskeletal: He exhibits no edema and no tenderness.       Lower extremities are symmetric as compared to each other. There is no calf  tenderness. No edema. Negative Homans.  Neurological: He is alert.  Skin: Skin is warm and dry. He is not diaphoretic.  Psychiatric: He has a normal mood and affect. His behavior is normal. Thought content normal.    ED Course  Procedures (including critical care time)  Labs Reviewed  CBC - Abnormal; Notable for the following:    RBC 4.01 (*)    Hemoglobin 11.3 (*)    HCT 33.2 (*)    RDW 15.9 (*)    All other components within normal limits  BASIC METABOLIC PANEL  TROPONIN I   Dg Chest 2 View  03/19/2012  *RADIOLOGY REPORT*  Clinical Data: Left chest pain radiating to neck, jaw and left arm, history coronary artery disease post MI x 4, coronary arterial stenting 03/04/2012  CHEST - 2 VIEW  Comparison: 12/20/2011  Findings: Upper normal heart size post CABG. Mediastinal contours and pulmonary vascularity normal. Mild chronic peribronchial thickening. Question mild emphysematous changes. Right nipple shadow, corresponding to nipple marker on previous exam. No acute infiltrate, failure, or effusion. No pneumothorax. Bones appear demineralized with endplate spur formation thoracic spine.  IMPRESSION: Post CABG. Bronchitic and question mild emphysematous changes.  Original Report Authenticated By: Lollie Marrow, M.D.    EKG:  Rhythm: Normal sinus rhythm Rate: 84 Axis: Normal Intervals: Nonspecific interventricular delay ST segments: Nonspecific ST changes. There is T-wave flattening laterally and T wave inversions inferiorly. These are noted on prior EKG from 03/04/2012.         MDM  56 year old male with chest pain. Patient with known coronary artery disease and ST segment elevation MI 2 weeks ago. Workup today fairly unremarkable. Patient is being discharged in police custody. Patient has been without his medicines since he left the hospital AGAINST MEDICAL ADVICE. Prescriptions were provided to patient. Apparently patient will be able to obtain these from prison with prescriptions.  Discussed with patient the need for him to followup closely at the [place] upon being released from prison.        Raeford Razor, MD 03/28/12 [phone]

## 2012-03-19 NOTE — Discharge Instructions (Signed)
Cellulitis Cellulitis is an infection of the skin and the tissue beneath it. The area is typically red and tender. It is caused by germs (bacteria) (usually staph or strep) that enter the body through cuts or sores. Cellulitis most commonly occurs in the arms or lower legs.  HOME CARE INSTRUCTIONS   If you are given a prescription for medications which kill germs (antibiotics), take as directed until finished.   If the infection is on the arm or leg, keep the limb elevated as able.   Use a warm cloth several times per day to relieve pain and encourage healing.   See your caregiver for recheck of the infected site as directed if problems arise.   Only take over-the-counter or prescription medicines for pain, discomfort, or fever as directed by your caregiver.  SEEK MEDICAL CARE IF:   The area of redness (inflammation) is spreading, there are red streaks coming from the infected site, or if a part of the infection begins to turn dark in color.   The joint or bone underneath the infected skin becomes painful after the skin has healed.   The infection returns in the same or another area after it seems to have gone away.   A boil or bump swells up. This may be an abscess.   New, unexplained problems such as pain or fever develop.  SEEK IMMEDIATE MEDICAL CARE IF:   You have a fever.   You or your child feels drowsy or lethargic.   There is vomiting, diarrhea, or lasting discomfort or feeling ill (malaise) with muscle aches and pains.  MAKE SURE YOU:   Understand these instructions.   Will watch your condition.   Will get help right away if you are not doing well or get worse.  Document Released: 09/19/2005 Document Revised: 11/29/2011 Document Reviewed: 07/28/2008 [facility] Patient Information 2012 [place], [place].Chest Pain (Nonspecific) It is often hard to give a specific diagnosis for the cause of chest pain. There is always a chance that your pain could be related to something  serious, such as a heart attack or a blood clot in the lungs. You need to follow up with your caregiver for further evaluation. CAUSES   Heartburn.   Pneumonia or bronchitis.   Anxiety or stress.   Inflammation around your heart (pericarditis) or lung (pleuritis or pleurisy).   A blood clot in the lung.   A collapsed lung (pneumothorax). It can develop suddenly on its own (spontaneous pneumothorax) or from injury (trauma) to the chest.   Shingles infection (herpes zoster virus).  The chest wall is composed of bones, muscles, and cartilage. Any of these can be the source of the pain.  The bones can be bruised by injury.   The muscles or cartilage can be strained by coughing or overwork.   The cartilage can be affected by inflammation and become sore (costochondritis).  DIAGNOSIS  Lab tests or other studies, such as X-rays, electrocardiography, stress testing, or cardiac imaging, may be needed to find the cause of your pain.  TREATMENT   Treatment depends on what may be causing your chest pain. Treatment may include:   Acid blockers for heartburn.   Anti-inflammatory medicine.   Pain medicine for inflammatory conditions.   Antibiotics if an infection is present.   You may be advised to change lifestyle habits. This includes stopping smoking and avoiding alcohol, caffeine, and chocolate.   You may be advised to keep your head raised (elevated) when sleeping. This reduces the chance  of acid going backward from your stomach into your esophagus.   Most of the time, nonspecific chest pain will improve within 2 to 3 days with rest and mild pain medicine.  HOME CARE INSTRUCTIONS   If antibiotics were prescribed, take your antibiotics as directed. Finish them even if you start to feel better.   For the next few days, avoid physical activities that bring on chest pain. Continue physical activities as directed.   Do not smoke.   Avoid drinking alcohol.   Only take  over-the-counter or prescription medicine for pain, discomfort, or fever as directed by your caregiver.   Follow your caregiver's suggestions for further testing if your chest pain does not go away.   Keep any follow-up appointments you made. If you do not go to an appointment, you could develop lasting (chronic) problems with pain. If there is any problem keeping an appointment, you must call to reschedule.  SEEK MEDICAL CARE IF:   You think you are having problems from the medicine you are taking. Read your medicine instructions carefully.   Your chest pain does not go away, even after treatment.   You develop a rash with blisters on your chest.  SEEK IMMEDIATE MEDICAL CARE IF:   You have increased chest pain or pain that spreads to your arm, neck, jaw, back, or abdomen.   You develop shortness of breath, an increasing cough, or you are coughing up blood.   You have severe back or abdominal pain, feel nauseous, or vomit.   You develop severe weakness, fainting, or chills.   You have a fever.  THIS IS AN EMERGENCY. Do not wait to see if the pain will go away. Get medical help at once. Call your local emergency services (911 in [place] Do not drive yourself to the hospital. MAKE SURE YOU:   Understand these instructions.   Will watch your condition.   Will get help right away if you are not doing well or get worse.  Document Released: 09/19/2005 Document Revised: 11/29/2011 Document Reviewed: 07/15/2008 [facility] Patient Information 2012 [place], [place].

## 2012-03-19 NOTE — ED Notes (Signed)
Spoke to [employer], Sports coach, about helping the patient get his medicines before he gets discharged. She was informed that the patient has the [place] providing his medicines but is only able to go to the clinic on the first. She suggested for the ERMD to write the prescription for a $4 dollar prescription at [facility] and go the cardiologist to get samples for his cardiac medicines. Dr. Juleen China was made aware.

## 2012-03-19 NOTE — ED Notes (Signed)
Pt received to [facility] with c/o chest pain onset an hour ago with SOB and nausea. Pt was given 342 mg of ASA and had 2 NTG SL. Pt is A/A/Ox4, skin is warm and dry, respiration is even and unlabored. Pt is attached to the monitor

## 2012-03-19 NOTE — ED Notes (Addendum)
Cardiac case manager called back and informed this RN that if the patient going to jail that he will be provided his medicines in jail as long as the ERMD writes him a prescription and recommended that 2 sets of prescriptions be written for him , one of the jail and one for him when he leaves the jail. Dr. Juleen China made aware.

## 2012-03-25 DIAGNOSIS — M79609 Pain in unspecified limb: Secondary | ICD-10-CM | POA: Insufficient documentation

## 2012-03-25 DIAGNOSIS — R5383 Other fatigue: Secondary | ICD-10-CM | POA: Insufficient documentation

## 2012-03-25 DIAGNOSIS — Z79899 Other long term (current) drug therapy: Secondary | ICD-10-CM | POA: Insufficient documentation

## 2012-03-25 DIAGNOSIS — I25810 Atherosclerosis of coronary artery bypass graft(s) without angina pectoris: Secondary | ICD-10-CM | POA: Insufficient documentation

## 2012-03-25 DIAGNOSIS — F17200 Nicotine dependence, unspecified, uncomplicated: Secondary | ICD-10-CM | POA: Insufficient documentation

## 2012-03-25 DIAGNOSIS — W57XXXA Bitten or stung by nonvenomous insect and other nonvenomous arthropods, initial encounter: Secondary | ICD-10-CM | POA: Insufficient documentation

## 2012-03-25 DIAGNOSIS — R5381 Other malaise: Secondary | ICD-10-CM | POA: Insufficient documentation

## 2012-03-25 DIAGNOSIS — T148 Other injury of unspecified body region: Secondary | ICD-10-CM | POA: Insufficient documentation

## 2012-03-25 DIAGNOSIS — I252 Old myocardial infarction: Secondary | ICD-10-CM | POA: Insufficient documentation

## 2012-03-26 ENCOUNTER — Emergency Department (HOSPITAL_COMMUNITY)
Admission: EM | Admit: 2012-03-26 | Discharge: 2012-03-26 | Discharge status: Against Medical Advice | Payer: Medicare Other | Attending: Emergency Medicine | Admitting: Emergency Medicine

## 2012-03-26 ENCOUNTER — Encounter (HOSPITAL_COMMUNITY): Payer: Self-pay | Admitting: Emergency Medicine

## 2012-03-26 DIAGNOSIS — W57XXXA Bitten or stung by nonvenomous insect and other nonvenomous arthropods, initial encounter: Secondary | ICD-10-CM

## 2012-03-26 MED ORDER — FAMOTIDINE 20 MG PO TABS
40.0000 mg | ORAL_TABLET | Freq: Once | ORAL | Status: AC
  Administered 2012-03-26: 40 mg via ORAL
  Filled 2012-03-26: qty 2

## 2012-03-26 NOTE — ED Provider Notes (Signed)
History     CSN: [id number]  Arrival date & time 03/25/12  2350   First MD Initiated Contact with Patient 03/26/12 [phone]      Chief Complaint  Patient presents with  . Rash  . Fatigue    (Consider location/radiation/quality/duration/timing/severity/associated sxs/prior treatment) HPI Comments: Patient states that he has had a rash on his extremities, upper back, and upper chest for several weeks, since he's been sleeping on a sofa in an abandoned home.  He was seen.  This emergency room on March 27 and admitted to the hospital for chest pain and rash with the working diagnosis of cellulitis.  He states he was discharged from the hospital on an antibiotic, although there is no discharge summary at this time, which he states he took for 5 days and then he spent a week in jail, where he did not have any antibiotics.  He was released from jail one day ago and is been sleeping at a friend's house for one night.  He has scratched most of the rash, open.  There is no surrounding erythema most of the rash sites have a superficial scab in place.  They do not include the finger web spaces or toe web spaces.   Patient is a 56 y.o. male presenting with rash. The history is provided by the patient.  Rash  The current episode started more than 1 week ago. The problem has not changed since onset.The problem is associated with an unknown factor. There has been no fever. The rash is present on the torso, right upper leg, right lower leg, right arm, left lower leg, left upper leg, left buttock and left arm. The pain is at a severity of 4/10. The pain is moderate. The pain has been constant since onset.    Past Medical History  Diagnosis Date  . Bipolar 2 disorder   . Gout   . Coronary artery disease   . MI (myocardial infarction)     Past Surgical History  Procedure Date  . Coronary stent placement   . Cholecystectomy   . Coronary angioplasty   . Coronary artery bypass graft   . Femoral-popliteal  bypass graft     History reviewed. No pertinent family history.  History  Substance Use Topics  . Smoking status: Current Everyday Smoker -- 1.0 packs/day    Types: Cigarettes  . Smokeless tobacco: Not on file  . Alcohol Use: Yes     former drinker      Review of Systems  Constitutional: Negative for fever and chills.  Respiratory: Negative for shortness of breath.   Gastrointestinal: Negative for nausea and vomiting.  Musculoskeletal: Negative for joint swelling.  Skin: Positive for rash.    Allergies  Prednisone  Home Medications   Current Outpatient Rx  Name Route Sig Dispense Refill  . ALBUTEROL SULFATE HFA 108 (90 BASE) MCG/ACT IN AERS Inhalation Inhale 2 puffs into the lungs every 6 (six) hours as needed. WHEEZING     . ALLOPURINOL 300 MG PO TABS Oral Take 450 mg by mouth daily. Pt takes 1/2  tab    . VITAMIN C 1000 MG PO TABS Oral Take 1,000 mg by mouth daily.      . ASPIRIN 81 MG PO CHEW Oral Chew 1 tablet (81 mg total) by mouth daily. 30 tablet 2  . ASPIRIN EC 81 MG PO TBEC Oral Take 81 mg by mouth daily.    . COLCHICINE 0.6 MG PO TABS Oral Take 0.6 mg by  mouth 2 (two) times daily.     . FENOFIBRATE 160 MG PO TABS Oral Take 160 mg by mouth daily.    Marland Kitchen FERROUS SULFATE 325 (65 FE) MG PO TABS Oral Take 325 mg by mouth daily with breakfast.    . GEMFIBROZIL 600 MG PO TABS Oral Take 600 mg by mouth daily.      Marland Kitchen GEMFIBROZIL 600 MG PO TABS Oral Take 1 tablet (600 mg total) by mouth daily. 30 tablet 2  . HYDROCHLOROTHIAZIDE 25 MG PO TABS Oral Take 25 mg by mouth daily.      Marland Kitchen HYDROCHLOROTHIAZIDE 25 MG PO TABS Oral Take 1 tablet (25 mg total) by mouth daily. 30 tablet 2  . OMEGA-3-ACID ETHYL ESTERS 1 G PO CAPS Oral Take 2 g by mouth 2 (two) times daily.    . OMEGA-3-ACID ETHYL ESTERS 1 G PO CAPS Oral Take 2 capsules (2 g total) by mouth 2 (two) times daily. 120 capsule 2  . OMEPRAZOLE 20 MG PO CPDR Oral Take 40 mg by mouth 2 (two) times daily.      Marland Kitchen SIMVASTATIN 20 MG  PO TABS Oral Take 20 mg by mouth at bedtime.      Marland Kitchen SIMVASTATIN 20 MG PO TABS Oral Take 1 tablet (20 mg total) by mouth daily. 30 tablet 2  . SULFAMETHOXAZOLE-TRIMETHOPRIM 800-160 MG PO TABS Oral Take 1 tablet by mouth every 12 (twelve) hours. 14 tablet 0  . TRAMADOL HCL 50 MG PO TABS Oral Take 100 mg by mouth every 6 (six) hours as needed. For pain. Maximum dose= 8 tablets per day      BP 135/79  Pulse 94  Temp 98.3 F (36.8 C)  Resp 16  SpO2 100%  Physical Exam  Constitutional: He is oriented to person, place, and time. He appears well-developed and well-nourished.  HENT:  Head: Normocephalic.  Eyes: Pupils are equal, round, and reactive to light.  Neck: Normal range of motion.  Cardiovascular: Normal rate.   Pulmonary/Chest: Effort normal.  Abdominal: Soft.  Musculoskeletal: He exhibits tenderness.       Right shoulder: He exhibits tenderness and pain.       The anterior portion of the left shin is  red and slightly tender without swelling, it appears as though the rash has coalesced in this area  Neurological: He is alert and oriented to person, place, and time.  Skin: Rash noted.    ED Course  Procedures (including critical care time)  Labs Reviewed - No data to display No results found.   1. Bug bites       MDM   Bed bugs        Arman Filter, NP 03/27/12 0539  Arman Filter, NP 03/27/12 [phone]

## 2012-03-26 NOTE — ED Notes (Signed)
Pt reports that he had a recent heart attack and a "bare-metal' stent in his heart 3 weeks ago--- states he started having "skin rashes" ever since stent placement.

## 2012-03-26 NOTE — ED Notes (Signed)
Presents to ED with c/o "itchy and burning" rashes on legs, side of trunk, chest and arms--- onset 2 weeks ago, "it's just getting worse". Pt also c/o general body malaise.

## 2012-03-27 NOTE — ED Provider Notes (Signed)
Medical screening examination/treatment/procedure(s) were performed by non-physician practitioner and as supervising physician I was immediately available for consultation/collaboration.  Olivia Mackie, MD 03/27/12 [phone]

## 2013-07-02 ENCOUNTER — Encounter (HOSPITAL_COMMUNITY): Payer: Self-pay

## 2013-07-02 ENCOUNTER — Emergency Department (HOSPITAL_COMMUNITY)
Admission: EM | Admit: 2013-07-02 | Discharge: 2013-07-02 | Disposition: A | Discharge status: Home / Self Care | Payer: Non-veteran care | Attending: Emergency Medicine | Admitting: Emergency Medicine

## 2013-07-02 DIAGNOSIS — F17200 Nicotine dependence, unspecified, uncomplicated: Secondary | ICD-10-CM | POA: Insufficient documentation

## 2013-07-02 DIAGNOSIS — I252 Old myocardial infarction: Secondary | ICD-10-CM | POA: Insufficient documentation

## 2013-07-02 DIAGNOSIS — Z79899 Other long term (current) drug therapy: Secondary | ICD-10-CM | POA: Insufficient documentation

## 2013-07-02 DIAGNOSIS — M2540 Effusion, unspecified joint: Secondary | ICD-10-CM | POA: Insufficient documentation

## 2013-07-02 DIAGNOSIS — M2550 Pain in unspecified joint: Secondary | ICD-10-CM | POA: Insufficient documentation

## 2013-07-02 DIAGNOSIS — Z7982 Long term (current) use of aspirin: Secondary | ICD-10-CM | POA: Insufficient documentation

## 2013-07-02 DIAGNOSIS — M109 Gout, unspecified: Secondary | ICD-10-CM | POA: Insufficient documentation

## 2013-07-02 DIAGNOSIS — I2510 Atherosclerotic heart disease of native coronary artery without angina pectoris: Secondary | ICD-10-CM | POA: Insufficient documentation

## 2013-07-02 DIAGNOSIS — F3189 Other bipolar disorder: Secondary | ICD-10-CM | POA: Insufficient documentation

## 2013-07-02 LAB — POCT I-STAT, CHEM 8
Creatinine, Ser: 1 mg/dL (ref 0.50–1.35)
HCT: 31 % — ABNORMAL LOW (ref 39.0–52.0)
Hemoglobin: 10.5 g/dL — ABNORMAL LOW (ref 13.0–17.0)
Sodium: 138 mEq/L (ref 135–145)
TCO2: 24 mmol/L (ref 0–100)

## 2013-07-02 LAB — POCT I-STAT TROPONIN I

## 2013-07-02 MED ORDER — KETOROLAC TROMETHAMINE 30 MG/ML IJ SOLN
30.0000 mg | Freq: Once | INTRAMUSCULAR | Status: AC
  Administered 2013-07-02: 30 mg via INTRAMUSCULAR
  Filled 2013-07-02: qty 1

## 2013-07-02 MED ORDER — PREDNISONE 20 MG PO TABS: 40.0000 mg | ORAL_TABLET | Freq: Every day | ORAL | Status: DC

## 2013-07-02 MED ORDER — PREDNISONE 20 MG PO TABS
60.0000 mg | ORAL_TABLET | Freq: Once | ORAL | Status: AC
  Administered 2013-07-02: 60 mg via ORAL
  Filled 2013-07-02: qty 3

## 2013-07-02 MED ORDER — OXYCODONE-ACETAMINOPHEN 5-325 MG PO TABS: 1.0000 | ORAL_TABLET | ORAL | Status: DC | PRN

## 2013-07-02 NOTE — ED Provider Notes (Signed)
History    CSN: [id number] Arrival date & time 07/02/13  1191  First MD Initiated Contact with Patient 07/02/13 0445     Chief Complaint  Patient presents with  . Wrist Pain   (Consider location/radiation/quality/duration/timing/severity/associated sxs/prior Treatment) Patient is a 57 y.o. male presenting with wrist pain. The history is provided by the patient. No language interpreter was used.  Wrist Pain This is a new problem. Episode onset: 2 days ago. The problem occurs constantly. The problem has been gradually worsening. Associated symptoms include arthralgias and joint swelling. Pertinent negatives include no chest pain, coughing, diaphoresis, fever, nausea, numbness, vomiting or weakness. Exacerbated by: light touch or palpation. Treatments tried: allopurinol and tylenol. The treatment provided no relief.   Past Medical History  Diagnosis Date  . Bipolar 2 disorder   . Gout   . Coronary artery disease   . MI (myocardial infarction)    Past Surgical History  Procedure Laterality Date  . Coronary stent placement    . Cholecystectomy    . Coronary angioplasty    . Coronary artery bypass graft    . Femoral-popliteal bypass graft    . Toe surgery     No family history on file. History  Substance Use Topics  . Smoking status: Current Every Day Smoker -- 1.00 packs/day    Types: Cigarettes  . Smokeless tobacco: Not on file  . Alcohol Use: Yes     Comment: former drinker    Review of Systems  Constitutional: Negative for fever and diaphoresis.  Respiratory: Negative for cough and shortness of breath.   Cardiovascular: Negative for chest pain.  Gastrointestinal: Negative for nausea and vomiting.  Musculoskeletal: Positive for joint swelling and arthralgias.  Skin: Negative for pallor.  Neurological: Negative for weakness and numbness.  All other systems reviewed and are negative.    Allergies  Prednisone  Home Medications   Current Outpatient Rx  Name   Route  Sig  Dispense  Refill  . albuterol (PROVENTIL HFA;VENTOLIN HFA) 108 (90 BASE) MCG/ACT inhaler   Inhalation   Inhale 2 puffs into the lungs every 6 (six) hours as needed. WHEEZING          . allopurinol (ZYLOPRIM) 300 MG tablet   Oral   Take 450 mg by mouth daily.          . Ascorbic Acid (VITAMIN C) 1000 MG tablet   Oral   Take 1,000 mg by mouth daily.           Marland Kitchen aspirin EC 81 MG tablet   Oral   Take 81 mg by mouth daily.         . colchicine 0.6 MG tablet   Oral   Take 0.6 mg by mouth 2 (two) times daily.          . ferrous sulfate 325 (65 FE) MG tablet   Oral   Take 325 mg by mouth daily with breakfast.         . hydrochlorothiazide (HYDRODIURIL) 25 MG tablet   Oral   Take 25 mg by mouth daily.           Marland Kitchen omega-3 acid ethyl esters (LOVAZA) 1 G capsule   Oral   Take 2 g by mouth 2 (two) times daily.         Marland Kitchen omeprazole (PRILOSEC) 20 MG capsule   Oral   Take 40 mg by mouth 2 (two) times daily.           Marland Kitchen  PRESCRIPTION MEDICATION   Oral   Take 1 tablet by mouth daily. Cholesterol medication         . traMADol (ULTRAM) 50 MG tablet   Oral   Take 100 mg by mouth every 6 (six) hours as needed. For pain. Maximum dose= 8 tablets per day         . EXPIRED: gemfibrozil (LOPID) 600 MG tablet   Oral   Take 1 tablet (600 mg total) by mouth daily.   30 tablet   2   . EXPIRED: hydrochlorothiazide (HYDRODIURIL) 25 MG tablet   Oral   Take 1 tablet (25 mg total) by mouth daily.   30 tablet   2   . oxyCODONE-acetaminophen (PERCOCET/ROXICET) 5-325 MG per tablet   Oral   Take 1 tablet by mouth every 4 (four) hours as needed for pain.   20 tablet   0   . predniSONE (DELTASONE) 20 MG tablet   Oral   Take 2 tablets (40 mg total) by mouth daily.   10 tablet   0    BP 145/75  Pulse 60  Temp(Src) 97.8 F (36.6 C) (Oral)  Resp 13  Ht 5\' 9"  (1.753 m)  Wt 190 lb (86.183 kg)  BMI 28.05 kg/m2  SpO2 98% Physical Exam  Nursing note and  vitals reviewed. Constitutional: He is oriented to person, place, and time. He appears well-developed and well-nourished. No distress.  HENT:  Head: Normocephalic and atraumatic.  Mouth/Throat: Oropharynx is clear and moist. No oropharyngeal exudate.  Eyes: Conjunctivae and EOM are normal. Pupils are equal, round, and reactive to light. No scleral icterus.  Neck: Normal range of motion. Neck supple.  Cardiovascular: Normal rate, regular rhythm, normal heart sounds and intact distal pulses.   Distal radial pulses 2+ b/l. Capillary refill normal.  Pulmonary/Chest: Effort normal and breath sounds normal. No respiratory distress. He has no wheezes. He has no rales.  Musculoskeletal:       Left wrist: He exhibits decreased range of motion (secondary to pain), tenderness, bony tenderness and swelling. He exhibits no effusion, no crepitus, no deformity and no laceration.       Left hand: Normal. He exhibits normal capillary refill, no deformity, no laceration and no swelling. Normal sensation noted. Normal strength noted.  Neurological: He is alert and oriented to person, place, and time. No sensory deficit. GCS eye subscore is 4. GCS verbal subscore is 5. GCS motor subscore is 6.  Skin: Skin is warm and dry. No rash noted. He is not diaphoretic. No erythema. No pallor.  Psychiatric: He has a normal mood and affect. His behavior is normal.    ED Course  Procedures (including critical care time) Labs Reviewed  POCT I-STAT, CHEM 8 - Abnormal; Notable for the following:    Glucose, Bld 111 (*)    Hemoglobin 10.5 (*)    HCT 31.0 (*)    All other components within normal limits  POCT I-STAT TROPONIN I    Date: 07/02/2013  Rate: 65  Rhythm: normal sinus rhythm and premature ventricular contractions (PVC) x 3  QRS Axis: normal  Intervals: normal  ST/T Wave abnormalities: normal  Conduction Disutrbances:nonspecific intraventricular conduction delay  Narrative Interpretation: NSR with PVCs x 3 and  IVCD; no STEMI  Old EKG Reviewed: no PVCs on 03/04/13; otherwise unchanged I have personally reviewed and interpreted this EKG.  No results found.  1. Acute gouty arthritis    MDM  Uncomplicated acute gout flare of L wrist -  Patient neurovascularly intact without sensory or motor deficits. No hx of trauma or injury to wrist. No pallor, poikilothermia, paresthesias, or pulselessness to suspect complicating injury. No erythema or heat-to-touch to suspect infectious or cellulitic joint process. Patient with no complaints to chest pain, SOB, cough, lightheadedness, or dizziness. Per nurse, labs and EKG ordered in triage as precautionary measure and not because of symptomatic complaints c/w ACS. Patient hemodynamically stable and appropriate for d/c with prednisone and percocet for symptoms. Indications for ED return discussed and patient agreeable to plan.      Antony Madura, PA-C 07/07/13 [phone]

## 2013-07-02 NOTE — ED Notes (Addendum)
Pt states "i think i am having an acute flare up of gout in my right wrist/arm." Pt states pain  X 2 days. Pt denies recent injury or fall. Sts he has gout for 30 yrs.

## 2013-07-07 NOTE — ED Provider Notes (Signed)
Medical screening examination/treatment/procedure(s) were performed by non-physician practitioner and as supervising physician I was immediately available for consultation/collaboration.  Geoffery Lyons, MD 07/07/13 [phone]

## 2013-10-07 ENCOUNTER — Other Ambulatory Visit: Payer: Self-pay

## 2013-10-07 ENCOUNTER — Emergency Department (HOSPITAL_COMMUNITY): Payer: Non-veteran care

## 2013-10-07 ENCOUNTER — Encounter (HOSPITAL_COMMUNITY): Payer: Self-pay | Admitting: Emergency Medicine

## 2013-10-07 ENCOUNTER — Inpatient Hospital Stay (HOSPITAL_COMMUNITY)
Admission: EM | Admit: 2013-10-07 | Discharge: 2013-10-10 | DRG: 251 | Disposition: A | Discharge status: Home / Self Care | Payer: Non-veteran care | Attending: Cardiovascular Disease | Admitting: Cardiovascular Disease

## 2013-10-07 ENCOUNTER — Encounter (HOSPITAL_COMMUNITY)
Admission: EM | Disposition: A | Discharge status: Home / Self Care | Payer: Non-veteran care | Source: Home / Self Care | Attending: Cardiovascular Disease

## 2013-10-07 DIAGNOSIS — Z5900 Homelessness unspecified: Secondary | ICD-10-CM

## 2013-10-07 DIAGNOSIS — I255 Ischemic cardiomyopathy: Secondary | ICD-10-CM | POA: Diagnosis present

## 2013-10-07 DIAGNOSIS — IMO0002 Reserved for concepts with insufficient information to code with codable children: Secondary | ICD-10-CM

## 2013-10-07 DIAGNOSIS — I2589 Other forms of chronic ischemic heart disease: Secondary | ICD-10-CM | POA: Diagnosis present

## 2013-10-07 DIAGNOSIS — F319 Bipolar disorder, unspecified: Secondary | ICD-10-CM | POA: Diagnosis present

## 2013-10-07 DIAGNOSIS — B1710 Acute hepatitis C without hepatic coma: Secondary | ICD-10-CM | POA: Diagnosis present

## 2013-10-07 DIAGNOSIS — Y832 Surgical operation with anastomosis, bypass or graft as the cause of abnormal reaction of the patient, or of later complication, without mention of misadventure at the time of the procedure: Secondary | ICD-10-CM | POA: Diagnosis present

## 2013-10-07 DIAGNOSIS — Z79899 Other long term (current) drug therapy: Secondary | ICD-10-CM

## 2013-10-07 DIAGNOSIS — Z9119 Patient's noncompliance with other medical treatment and regimen: Secondary | ICD-10-CM

## 2013-10-07 DIAGNOSIS — Z95820 Peripheral vascular angioplasty status with implants and grafts: Secondary | ICD-10-CM

## 2013-10-07 DIAGNOSIS — Z9111 Patient's noncompliance with dietary regimen: Secondary | ICD-10-CM

## 2013-10-07 DIAGNOSIS — E785 Hyperlipidemia, unspecified: Secondary | ICD-10-CM | POA: Diagnosis present

## 2013-10-07 DIAGNOSIS — I739 Peripheral vascular disease, unspecified: Secondary | ICD-10-CM | POA: Diagnosis present

## 2013-10-07 DIAGNOSIS — Z91199 Patient's noncompliance with other medical treatment and regimen due to unspecified reason: Secondary | ICD-10-CM

## 2013-10-07 DIAGNOSIS — I252 Old myocardial infarction: Secondary | ICD-10-CM

## 2013-10-07 DIAGNOSIS — I2119 ST elevation (STEMI) myocardial infarction involving other coronary artery of inferior wall: Secondary | ICD-10-CM

## 2013-10-07 DIAGNOSIS — F3189 Other bipolar disorder: Secondary | ICD-10-CM | POA: Diagnosis present

## 2013-10-07 DIAGNOSIS — I25810 Atherosclerosis of coronary artery bypass graft(s) without angina pectoris: Secondary | ICD-10-CM | POA: Diagnosis present

## 2013-10-07 DIAGNOSIS — B1920 Unspecified viral hepatitis C without hepatic coma: Secondary | ICD-10-CM | POA: Diagnosis present

## 2013-10-07 DIAGNOSIS — Z7982 Long term (current) use of aspirin: Secondary | ICD-10-CM

## 2013-10-07 DIAGNOSIS — Z951 Presence of aortocoronary bypass graft: Secondary | ICD-10-CM

## 2013-10-07 DIAGNOSIS — E781 Pure hyperglyceridemia: Secondary | ICD-10-CM | POA: Diagnosis present

## 2013-10-07 DIAGNOSIS — I10 Essential (primary) hypertension: Secondary | ICD-10-CM | POA: Diagnosis present

## 2013-10-07 DIAGNOSIS — T82897A Other specified complication of cardiac prosthetic devices, implants and grafts, initial encounter: Secondary | ICD-10-CM | POA: Diagnosis present

## 2013-10-07 DIAGNOSIS — I213 ST elevation (STEMI) myocardial infarction of unspecified site: Secondary | ICD-10-CM | POA: Diagnosis present

## 2013-10-07 DIAGNOSIS — F17200 Nicotine dependence, unspecified, uncomplicated: Secondary | ICD-10-CM | POA: Diagnosis present

## 2013-10-07 DIAGNOSIS — I2510 Atherosclerotic heart disease of native coronary artery without angina pectoris: Secondary | ICD-10-CM | POA: Diagnosis present

## 2013-10-07 DIAGNOSIS — Z9861 Coronary angioplasty status: Secondary | ICD-10-CM

## 2013-10-07 DIAGNOSIS — M109 Gout, unspecified: Secondary | ICD-10-CM | POA: Diagnosis present

## 2013-10-07 HISTORY — DX: ST elevation (STEMI) myocardial infarction involving other coronary artery of inferior wall: I21.19

## 2013-10-07 HISTORY — PX: LEFT HEART CATHETERIZATION WITH CORONARY ANGIOGRAM: SHX5451

## 2013-10-07 HISTORY — DX: Presence of coronary angioplasty implant and graft: Z95.5

## 2013-10-07 HISTORY — DX: Peripheral vascular disease, unspecified: I73.9

## 2013-10-07 LAB — BASIC METABOLIC PANEL
BUN: 9 mg/dL (ref 6–23)
Creatinine, Ser: 0.75 mg/dL (ref 0.50–1.35)
GFR calc Af Amer: >90 mL/min (ref >90)
GFR calc non Af Amer: >90 mL/min (ref >90)
Potassium: 3.7 mEq/L (ref 3.5–5.1)

## 2013-10-07 LAB — POCT I-STAT TROPONIN I: Troponin i, poc: 0 ng/mL (ref 0.00–0.08)

## 2013-10-07 LAB — PROTIME-INR: INR: 1.03 (ref 0.00–1.49)

## 2013-10-07 LAB — CBC
HCT: 33.8 % — ABNORMAL LOW (ref 39.0–52.0)
MCHC: 35.2 g/dL (ref 30.0–36.0)
Platelets: 183 10*3/uL (ref 150–400)
RDW: 14.9 % (ref 11.5–15.5)

## 2013-10-07 LAB — PRO B NATRIURETIC PEPTIDE: Pro B Natriuretic peptide (BNP): 206.7 pg/mL — ABNORMAL HIGH (ref 0–125)

## 2013-10-07 SURGERY — LEFT HEART CATHETERIZATION WITH CORONARY ANGIOGRAM
Anesthesia: LOCAL

## 2013-10-07 MED ORDER — HEPARIN SODIUM (PORCINE) 5000 UNIT/ML IJ SOLN
INTRAMUSCULAR | Status: AC
  Administered 2013-10-07: 4000 [IU] via INTRAVENOUS
  Filled 2013-10-07: qty 1

## 2013-10-07 MED ORDER — MORPHINE SULFATE 2 MG/ML IJ SOLN
INTRAMUSCULAR | Status: AC
  Administered 2013-10-07: 4 mg via INTRAVENOUS
  Filled 2013-10-07: qty 2

## 2013-10-07 MED ORDER — ASPIRIN 325 MG PO TABS: 325.0000 mg | ORAL_TABLET | ORAL | Status: DC

## 2013-10-07 MED ORDER — MIDAZOLAM HCL 2 MG/2ML IJ SOLN
INTRAMUSCULAR | Status: AC
  Filled 2013-10-07: qty 2

## 2013-10-07 MED ORDER — FENTANYL CITRATE 0.05 MG/ML IJ SOLN
INTRAMUSCULAR | Status: AC
  Filled 2013-10-07: qty 2

## 2013-10-07 MED ORDER — ONDANSETRON HCL 4 MG/2ML IJ SOLN
4.0000 mg | Freq: Once | INTRAMUSCULAR | Status: AC
  Administered 2013-10-07: 4 mg via INTRAVENOUS

## 2013-10-07 MED ORDER — LIDOCAINE HCL (PF) 1 % IJ SOLN
INTRAMUSCULAR | Status: AC
  Filled 2013-10-07: qty 30

## 2013-10-07 MED ORDER — MORPHINE SULFATE 4 MG/ML IJ SOLN: 4.0000 mg | Freq: Once | INTRAMUSCULAR | Status: DC

## 2013-10-07 MED ORDER — ONDANSETRON HCL 4 MG/2ML IJ SOLN
INTRAMUSCULAR | Status: AC
  Administered 2013-10-07: 4 mg via INTRAVENOUS
  Filled 2013-10-07: qty 2

## 2013-10-07 MED ORDER — HEPARIN SODIUM (PORCINE) 5000 UNIT/ML IJ SOLN
4000.0000 [IU] | Freq: Once | INTRAMUSCULAR | Status: AC
  Administered 2013-10-07: 4000 [IU] via INTRAVENOUS

## 2013-10-07 MED ORDER — HEPARIN (PORCINE) IN NACL 2-0.9 UNIT/ML-% IJ SOLN
INTRAMUSCULAR | Status: AC
  Filled 2013-10-07: qty 1000

## 2013-10-07 MED ORDER — BIVALIRUDIN 250 MG IV SOLR
INTRAVENOUS | Status: AC
  Filled 2013-10-07: qty 250

## 2013-10-07 MED ORDER — NITROGLYCERIN 0.2 MG/ML ON CALL CATH LAB
INTRAVENOUS | Status: AC
  Filled 2013-10-07: qty 1

## 2013-10-07 NOTE — ED Notes (Signed)
Pt. reports left chest pain radiating to left arm and left jaw onset this evening , pt. took 1 NTG sl with relief  ,slight SOB , denies nausea or diaphoresis , history of CAD and CABG . His cardiologist is at [facility] hospital.

## 2013-10-07 NOTE — ED Provider Notes (Signed)
CSN: [id number]     Arrival date & time 10/07/13  2233 History   First MD Initiated Contact with Patient 10/07/13 2249     Chief Complaint  Patient presents with  . Chest Pain   (Consider location/radiation/quality/duration/timing/severity/associated sxs/prior Treatment) Patient is a 57 y.o. male presenting with chest pain. The history is provided by the patient.  Chest Pain Pain location:  Substernal area Pain quality: pressure   Pain radiates to:  L jaw Pain radiates to the back: no   Pain severity:  Severe Onset quality:  Sudden Duration:  3 hours Timing:  Intermittent Progression:  Worsening Chronicity:  Recurrent Context: at rest   Relieved by:  Nitroglycerin Associated symptoms: nausea and shortness of breath   Associated symptoms: no abdominal pain, no altered mental status, no anxiety, no back pain, no cough, no fatigue, no fever, no numbness, no orthopnea, no syncope, not vomiting and no weakness   Risk factors: coronary artery disease and hypertension     Past Medical History  Diagnosis Date  . Bipolar 2 disorder   . Gout   . Coronary artery disease   . MI (myocardial infarction)    Past Surgical History  Procedure Laterality Date  . Coronary stent placement    . Cholecystectomy    . Coronary angioplasty    . Coronary artery bypass graft    . Femoral-popliteal bypass graft    . Toe surgery     No family history on file. History  Substance Use Topics  . Smoking status: Current Every Day Smoker -- 1.00 packs/day    Types: Cigarettes  . Smokeless tobacco: Not on file  . Alcohol Use: Yes     Comment: former drinker    Review of Systems  Unable to perform ROS: Acuity of condition  Constitutional: Negative for fever and fatigue.  Respiratory: Positive for shortness of breath. Negative for cough.   Cardiovascular: Positive for chest pain. Negative for orthopnea and syncope.  Gastrointestinal: Positive for nausea. Negative for vomiting and abdominal pain.   Musculoskeletal: Negative for back pain.  Neurological: Negative for weakness and numbness.    Allergies  Prednisone  Home Medications   No current outpatient prescriptions on file. BP 142/78  Pulse 68  Temp(Src) 98.3 F (36.8 C) (Oral)  Resp 15  Ht 5\' 9"  (1.753 m)  Wt 197 lb (89.359 kg)  BMI 29.08 kg/m2  SpO2 100% Physical Exam  Nursing note and vitals reviewed. Constitutional: He is oriented to person, place, and time. He appears well-developed and well-nourished. He appears distressed.  Uncomfortable appearing male in obvious distress  HENT:  Head: Normocephalic and atraumatic.  Mouth/Throat: Oropharynx is clear and moist. No oropharyngeal exudate.  Eyes: Conjunctivae and EOM are normal. Pupils are equal, round, and reactive to light.  Neck: Normal range of motion. Neck supple.  Cardiovascular: Normal rate, regular rhythm, normal heart sounds and intact distal pulses.  Exam reveals no gallop and no friction rub.   No murmur heard. Pulses:      Radial pulses are 2+ on the right side, and 2+ on the left side.  Midline sternotomy scar well-healed  Pulmonary/Chest: Effort normal and breath sounds normal. No respiratory distress. He has no wheezes. He has no rales.  Abdominal: Soft. He exhibits no distension and no mass. There is no tenderness. There is no rebound and no guarding.  Musculoskeletal: Normal range of motion. He exhibits no edema and no tenderness.  Lymphadenopathy:    He has no cervical adenopathy.  Neurological: He is alert and oriented to person, place, and time. No cranial nerve deficit.  Skin: Skin is warm. No rash noted. He is diaphoretic.  Psychiatric: He has a normal mood and affect. His behavior is normal. Judgment and thought content normal.    ED Course  Procedures (including critical care time) Labs Review Labs Reviewed  CBC - Abnormal; Notable for the following:    RBC 3.95 (*)    Hemoglobin 11.9 (*)    HCT 33.8 (*)    All other components  within normal limits  BASIC METABOLIC PANEL - Abnormal; Notable for the following:    Glucose, Bld 100 (*)    All other components within normal limits  PRO B NATRIURETIC PEPTIDE - Abnormal; Notable for the following:    Pro B Natriuretic peptide (BNP) 206.7 (*)    All other components within normal limits  PROTIME-INR  POCT I-STAT TROPONIN I   Imaging Review Dg Chest Port 1 View  10/07/2013   CLINICAL DATA:  Chest pain for 3 hr  EXAM: PORTABLE CHEST - 1 VIEW  COMPARISON:  None.  FINDINGS: Enlarged cardiomediastinal silhouette. Prior CABG. Clear lung fields with unremarkable osseous structures.  Moderate hyperinflation with coarse central markings suggest CABG.  IMPRESSION: Cardiomegaly. COPD. Similar appearance to priors. No active disease.   Electronically Signed   By: Davonna Belling M.D.   On: 10/07/2013 23:35    EKG Interpretation     Ventricular Rate:    PR Interval:    QRS Duration:   QT Interval:    QTC Calculation:   R Axis:     Text Interpretation:              Date: 10/08/2013  Rate:   Rhythm: normal sinus rhythm  QRS Axis: normal  Intervals: normal  ST/T Wave abnormalities: ST elevations inferiorly and ST depressions anteriorly  Conduction Disutrbances:none  Narrative Interpretation:   Old EKG Reviewed: changes noted    MDM   1. STEMI (ST elevation myocardial infarction)     57 year old male with a history of coronary artery disease status post bypass in 2009, bare-metal stent in April 2013, doubt, hypertension presents with a STEMI. Patient states onset of chest pain approximately 9:00. Chest pain responded to nitroglycerin 2 times, however this time it has not improved following third nitroglycerin. Did take aspirin 324 at home prior to arrival. Patient comes in via private vehicle with EKG showing ST elevation in his inferior leads with reciprocal changes in the anterior leads. Chest pain typical for previous MI pain. Constantly paged, labs drawn,  anticipate patient to the Cath Lab emergently.  Cardiology the bedside and patient taken to the Cath Lab emergently. Laboratory workup unremarkable. This patient was discussed with my attending, Dr. Fayrene Fearing.  Dorna Leitz, MD 10/08/13 [phone]

## 2013-10-07 NOTE — H&P (Signed)
Cardiology History and Physical  No PCP Per Patient  History of Present Illness (and review of medical records): [name] is a 57 y.o. male who presents for evaluation of chest pain.  He has hx of 3v CABG s/p inferior MI 02/2012 s/p BMS to SVG-PDA.  He now presents with acute onset of chest pain that began around 8pm after eating.  Pain was left sided and radiated to left arm and jaw.  Pain was 8/10 and was associated with diaphoresis.  He took 3 NTG with minimal relief.  His father brought him to the ED for further evaluation.  His initial ecg was concerning for ST elevation in inferior leads and code STEMI was called.  He was given ASA, NTG, Heparin.  Previous studies: Cath 02/2012: ANGIOGRAPHIC RESULTS:  1. Left main; normal  2. LAD; 80% mid at the takeoff of the second moderate size diagonal branch. There was competitive flow distally consistent with an intact mammary graft. The second diagonal branch had a 90% segmental proximal stenosis.  3. Left circumflex; 50% hypodense proximal in the AV groove, 80% segmental mid in the AV groove. There was competitive flow in the first marginal branch which was bypassed..  4. Right coronary artery; dominant with occlusion at the genu of the vessel  5.LIMA TO LAD; patent  6. Left radial graft to circumflex obtuse marginal branch was patent  SVG TO PDA was subtotally occluded secondary to thrombosis at the proximal portion. There was a large patulous vein graft that was disproportionately sized to the distal vessel.  7. Left ventriculography; RAO left ventriculogram was performed using  25 mL of Visipaque dye at 12 mL/second. The overall LVEF estimated  35-40 % With wall motion abnormalities notable for moderate to severe inferior hypokinesia and mild anterolateral hypokinesia  IMPRESSION:[name] has a thrombotic lead, subtotally occluded SVG to the PDA. He did have TIMI 3 flow. His chest pain had resolved by the time of catheter site the second  elevation had resolved as well. We'll proceed with aspiration thrombectomy, PCI stenting using bare-metal stent, Angiomax and Plavix  Review of Systems Further review of systems was otherwise negative other than stated in HPI.  Patient Active Problem List   Diagnosis Date Noted  . STEMI (ST elevation myocardial infarction) 03/04/2012  . CAD, CABG X 27 March 2008 03/04/2012  . Bipolar 1 disorder 03/04/2012  . Homeless 03/04/2012  . Noncompliance with diet and medication regimen 03/04/2012  . Smoker 03/04/2012  . PVD, Lt [facility]BPG at [facility] Oct 2012 03/04/2012  . Ischemic cardiomyopathy, EF 40% 2009 03/04/2012  . HEPATITIS C 02/20/2007  . HYPERLIPIDEMIA 02/20/2007  . GOUT, UNSPECIFIED 02/20/2007  . TOBACCO DEPENDENCE 02/20/2007  . HERNIA, HIATAL, NONCONGENITAL 02/20/2007  . GASTROINTESTINAL HEMORRHAGE 02/20/2007  . ARTHRITIS 02/20/2007   Past Medical History  Diagnosis Date  . Bipolar 2 disorder   . Gout   . Coronary artery disease   . MI (myocardial infarction)     Past Surgical History  Procedure Laterality Date  . Coronary stent placement    . Cholecystectomy    . Coronary angioplasty    . Coronary artery bypass graft    . Femoral-popliteal bypass graft    . Toe surgery      Prescriptions prior to admission  Medication Sig Dispense Refill  . albuterol (PROVENTIL HFA;VENTOLIN HFA) 108 (90 BASE) MCG/ACT inhaler Inhale 2 puffs into the lungs every 6 (six) hours as needed. WHEEZING       .  allopurinol (ZYLOPRIM) 300 MG tablet Take 450 mg by mouth daily.       . Ascorbic Acid (VITAMIN C) 1000 MG tablet Take 1,000 mg by mouth daily.        Marland Kitchen aspirin EC 81 MG tablet Take 81 mg by mouth daily.      . colchicine 0.6 MG tablet Take 0.6 mg by mouth 2 (two) times daily.       . ferrous sulfate 325 (65 FE) MG tablet Take 325 mg by mouth daily with breakfast.      . gemfibrozil (LOPID) 600 MG tablet Take 1 tablet (600 mg total) by mouth daily.  30 tablet  2  . hydrochlorothiazide  (HYDRODIURIL) 25 MG tablet Take 25 mg by mouth daily.        Marland Kitchen omega-3 acid ethyl esters (LOVAZA) 1 G capsule Take 2 g by mouth 2 (two) times daily.      Marland Kitchen omeprazole (PRILOSEC) 20 MG capsule Take 40 mg by mouth 2 (two) times daily.        Marland Kitchen oxyCODONE-acetaminophen (PERCOCET/ROXICET) 5-325 MG per tablet Take 1 tablet by mouth every 4 (four) hours as needed for pain.  20 tablet  0  . predniSONE (DELTASONE) 20 MG tablet Take 2 tablets (40 mg total) by mouth daily.  10 tablet  0  . PRESCRIPTION MEDICATION Take 1 tablet by mouth daily. Cholesterol medication      . traMADol (ULTRAM) 50 MG tablet Take 100 mg by mouth every 6 (six) hours as needed. For pain. Maximum dose= 8 tablets per day       Allergies  Allergen Reactions  . Prednisone Other (See Comments)    Causes steroid rage    History  Substance Use Topics  . Smoking status: Current Every Day Smoker -- 1.00 packs/day    Types: Cigarettes  . Smokeless tobacco: Not on file  . Alcohol Use: Yes     Comment: former drinker    No family history on file.   Objective:  Patient Vitals for the past 8 hrs:  BP Temp Temp src Pulse Resp SpO2 Height Weight  10/07/13 2312 142/78 mmHg - - 68 15 100 % - -  10/07/13 2245 185/102 mmHg - - 69 13 100 % - -  10/07/13 2236 172/89 mmHg 98.3 F (36.8 C) Oral 73 18 100 % 5\' 9"  (1.753 m) 89.359 kg (197 lb)   General appearance: alert, cooperative, appears stated age and no distress Head: Normocephalic, without obvious abnormality, atraumatic Eyes: conjunctivae/corneas clear. PERRL, EOM's intact. Fundi benign. Neck:  no JVD and supple, symmetrical, trachea midline Lungs: clear to auscultation bilaterally Chest wall: no tenderness, mid line sternotomy scar Heart: regular rate and rhythm, S1, S2 normal,  Abdomen: soft, non-tender; bowel sounds normal; no masses,  no organomegaly Extremities: extremities normal, atraumatic, no cyanosis or edema Pulses: 2+ and symmetric Neurologic: Grossly  normal  Results for orders placed during the hospital encounter of 10/07/13 (from the past 48 hour(s))  CBC     Status: Abnormal   Collection Time    10/07/13 10:44 PM      Result Value Range   WBC 8.3  4.0 - 10.5 K/uL   RBC 3.95 (*) 4.22 - 5.81 MIL/uL   Hemoglobin 11.9 (*) 13.0 - 17.0 g/dL   HCT 78.2 (*) 95.6 - 21.3 %   MCV 85.6  78.0 - 100.0 fL   MCH 30.1  26.0 - 34.0 pg   MCHC 35.2  30.0 - 36.0  g/dL   RDW 95.6  21.3 - 08.6 %   Platelets 183  150 - 400 K/uL  BASIC METABOLIC PANEL     Status: Abnormal   Collection Time    10/07/13 10:44 PM      Result Value Range   Sodium 137  135 - 145 mEq/L   Potassium 3.7  3.5 - 5.1 mEq/L   Chloride 99  96 - 112 mEq/L   CO2 25  19 - 32 mEq/L   Glucose, Bld 100 (*) 70 - 99 mg/dL   BUN 9  6 - 23 mg/dL   Creatinine, Ser 5.78  0.50 - 1.35 mg/dL   Calcium 9.0  8.4 - 46.9 mg/dL   GFR calc non Af Amer >90  >90 mL/min   GFR calc Af Amer >90  >90 mL/min   Comment: (NOTE)     The eGFR has been calculated using the CKD EPI equation.     This calculation has not been validated in all clinical situations.     eGFR's persistently <90 mL/min signify possible Chronic Kidney     Disease.  PRO B NATRIURETIC PEPTIDE     Status: Abnormal   Collection Time    10/07/13 10:44 PM      Result Value Range   Pro B Natriuretic peptide (BNP) 206.7 (*) 0 - 125 pg/mL  PROTIME-INR     Status: None   Collection Time    10/07/13 10:44 PM      Result Value Range   Prothrombin Time 13.3  11.6 - 15.2 seconds   INR 1.03  0.00 - 1.49  POCT I-STAT TROPONIN I     Status: None   Collection Time    10/07/13 10:44 PM      Result Value Range   Troponin i, poc 0.00  0.00 - 0.08 ng/mL   Comment 3            Comment: Due to the release kinetics of cTnI,     a negative result within the first hours     of the onset of symptoms does not rule out     myocardial infarction with certainty.     If myocardial infarction is still suspected,     repeat the test at appropriate  intervals.   No results found.  ECG:  HR 61, inferior ST elevation in inferior leads, ST depression in 1, avl  Assessment: Inferior STEMI  Plan: 1. Discussed with Dr. Eldridge Dace, will plan for emergent cardiac catheterization. 2. No contraindications to DAPT. 3. Cardiology Admission to CCU post procedure 4. Continuous monitoring on Telemetry. 5. Repeat ekg on admit, prn chest pain or arrythmia 6. Trend cardiac biomarkers 7. Tobacco cessation

## 2013-10-08 ENCOUNTER — Encounter (HOSPITAL_COMMUNITY): Payer: Self-pay | Admitting: Surgery

## 2013-10-08 DIAGNOSIS — I2119 ST elevation (STEMI) myocardial infarction involving other coronary artery of inferior wall: Principal | ICD-10-CM | POA: Insufficient documentation

## 2013-10-08 DIAGNOSIS — I2510 Atherosclerotic heart disease of native coronary artery without angina pectoris: Secondary | ICD-10-CM

## 2013-10-08 DIAGNOSIS — I359 Nonrheumatic aortic valve disorder, unspecified: Secondary | ICD-10-CM

## 2013-10-08 LAB — HEPATIC FUNCTION PANEL
AST: 25 U/L (ref 0–37)
Albumin: 3.4 g/dL — ABNORMAL LOW (ref 3.5–5.2)
Alkaline Phosphatase: 70 U/L (ref 39–117)
Total Bilirubin: 0.2 mg/dL — ABNORMAL LOW (ref 0.3–1.2)
Total Protein: 5.9 g/dL — ABNORMAL LOW (ref 6.0–8.3)

## 2013-10-08 LAB — BASIC METABOLIC PANEL
BUN: 9 mg/dL (ref 6–23)
Chloride: 101 mEq/L (ref 96–112)
GFR calc Af Amer: >90 mL/min (ref >90)
Potassium: 4 mEq/L (ref 3.5–5.1)

## 2013-10-08 LAB — CBC
HCT: 28 % — ABNORMAL LOW (ref 39.0–52.0)
Hemoglobin: 10.2 g/dL — ABNORMAL LOW (ref 13.0–17.0)
MCH: 30.8 pg (ref 26.0–34.0)
RDW: 15 % (ref 11.5–15.5)
WBC: 5.5 10*3/uL (ref 4.0–10.5)

## 2013-10-08 LAB — TROPONIN I
Troponin I: 2.87 ng/mL (ref <0.30)
Troponin I: 11.92 ng/mL (ref <0.30)

## 2013-10-08 LAB — MRSA PCR SCREENING: MRSA by PCR: NEGATIVE

## 2013-10-08 MED ORDER — METOPROLOL TARTRATE 12.5 MG HALF TABLET
12.5000 mg | ORAL_TABLET | Freq: Two times a day (BID) | ORAL | Status: DC
  Administered 2013-10-08 – 2013-10-09 (×4): 12.5 mg via ORAL
  Filled 2013-10-08 (×7): qty 1

## 2013-10-08 MED ORDER — ALBUTEROL SULFATE HFA 108 (90 BASE) MCG/ACT IN AERS: 2.0000 | INHALATION_SPRAY | Freq: Four times a day (QID) | RESPIRATORY_TRACT | Status: DC | PRN

## 2013-10-08 MED ORDER — HYDROCHLOROTHIAZIDE 25 MG PO TABS
25.0000 mg | ORAL_TABLET | Freq: Every day | ORAL | Status: DC
  Filled 2013-10-08: qty 1

## 2013-10-08 MED ORDER — ATORVASTATIN CALCIUM 40 MG PO TABS
40.0000 mg | ORAL_TABLET | Freq: Every day | ORAL | Status: DC
  Administered 2013-10-08: 40 mg via ORAL
  Filled 2013-10-08 (×2): qty 1

## 2013-10-08 MED ORDER — OMEGA-3-ACID ETHYL ESTERS 1 G PO CAPS
2.0000 g | ORAL_CAPSULE | Freq: Two times a day (BID) | ORAL | Status: DC
  Administered 2013-10-08 – 2013-10-10 (×5): 2 g via ORAL
  Filled 2013-10-08 (×6): qty 2

## 2013-10-08 MED ORDER — LISINOPRIL 2.5 MG PO TABS
2.5000 mg | ORAL_TABLET | Freq: Every day | ORAL | Status: DC
  Administered 2013-10-08: 2.5 mg via ORAL
  Filled 2013-10-08 (×2): qty 1

## 2013-10-08 MED ORDER — ASPIRIN EC 81 MG PO TBEC
81.0000 mg | DELAYED_RELEASE_TABLET | Freq: Every day | ORAL | Status: DC
  Administered 2013-10-08 – 2013-10-10 (×3): 81 mg via ORAL
  Filled 2013-10-08 (×3): qty 1

## 2013-10-08 MED ORDER — FERROUS SULFATE 325 (65 FE) MG PO TABS
325.0000 mg | ORAL_TABLET | Freq: Every day | ORAL | Status: DC
  Administered 2013-10-08 – 2013-10-10 (×3): 325 mg via ORAL
  Filled 2013-10-08 (×4): qty 1

## 2013-10-08 MED ORDER — ONDANSETRON HCL 4 MG/2ML IJ SOLN: 4.0000 mg | Freq: Four times a day (QID) | INTRAMUSCULAR | Status: DC | PRN

## 2013-10-08 MED ORDER — PREDNISONE 20 MG PO TABS
40.0000 mg | ORAL_TABLET | Freq: Every day | ORAL | Status: DC
  Filled 2013-10-08: qty 2

## 2013-10-08 MED ORDER — ACETAMINOPHEN 325 MG PO TABS: 650.0000 mg | ORAL_TABLET | ORAL | Status: DC | PRN

## 2013-10-08 MED ORDER — GEMFIBROZIL 600 MG PO TABS
600.0000 mg | ORAL_TABLET | Freq: Every day | ORAL | Status: DC
  Filled 2013-10-08: qty 1

## 2013-10-08 MED ORDER — ALLOPURINOL 300 MG PO TABS
450.0000 mg | ORAL_TABLET | Freq: Every day | ORAL | Status: DC
  Administered 2013-10-08 – 2013-10-10 (×3): 450 mg via ORAL
  Filled 2013-10-08 (×3): qty 1

## 2013-10-08 MED ORDER — COLCHICINE 0.6 MG PO TABS
0.6000 mg | ORAL_TABLET | Freq: Two times a day (BID) | ORAL | Status: DC
  Administered 2013-10-08 – 2013-10-10 (×5): 0.6 mg via ORAL
  Filled 2013-10-08 (×6): qty 1

## 2013-10-08 MED ORDER — ADENOSINE 6 MG/2ML IV SOLN
INTRAVENOUS | Status: AC
  Filled 2013-10-08: qty 2

## 2013-10-08 MED ORDER — ASPIRIN 81 MG PO CHEW: 81.0000 mg | CHEWABLE_TABLET | Freq: Every day | ORAL | Status: DC

## 2013-10-08 MED ORDER — PANTOPRAZOLE SODIUM 40 MG PO TBEC
40.0000 mg | DELAYED_RELEASE_TABLET | Freq: Every day | ORAL | Status: DC
  Administered 2013-10-08 – 2013-10-10 (×3): 40 mg via ORAL
  Filled 2013-10-08 (×3): qty 1

## 2013-10-08 MED ORDER — TICAGRELOR 90 MG PO TABS
90.0000 mg | ORAL_TABLET | Freq: Two times a day (BID) | ORAL | Status: DC
  Administered 2013-10-08 – 2013-10-10 (×5): 90 mg via ORAL
  Filled 2013-10-08 (×6): qty 1

## 2013-10-08 MED ORDER — TICAGRELOR 90 MG PO TABS
ORAL_TABLET | ORAL | Status: AC
  Filled 2013-10-08: qty 2

## 2013-10-08 MED ORDER — ATROPINE SULFATE 0.1 MG/ML IJ SOLN
INTRAMUSCULAR | Status: AC
  Filled 2013-10-08: qty 10

## 2013-10-08 MED ORDER — SODIUM CHLORIDE 0.9 % IV SOLN
INTRAVENOUS | Status: AC
  Administered 2013-10-08: 01:00:00 via INTRAVENOUS

## 2013-10-08 MED ORDER — OXYCODONE-ACETAMINOPHEN 5-325 MG PO TABS
1.0000 | ORAL_TABLET | ORAL | Status: DC | PRN
  Administered 2013-10-08 – 2013-10-10 (×7): 1 via ORAL
  Filled 2013-10-08 (×8): qty 1

## 2013-10-08 MED ORDER — TRAMADOL HCL 50 MG PO TABS: 100.0000 mg | ORAL_TABLET | Freq: Four times a day (QID) | ORAL | Status: DC | PRN

## 2013-10-08 MED FILL — Sodium Chloride IV Soln 0.9%: INTRAVENOUS | Qty: 50 | Status: AC

## 2013-10-08 NOTE — Progress Notes (Signed)
Right groin a level 0 prior to sheath pull. Sheath pulled at 0421.  Manual pressure held for 25 minutes with hemostasis achieved at 0446.  Patient tolerated procedure well with vital signs stable throughout.  Site is a level 0.  Post sheath instructions given to patient.  Patient verbalized understanding.  Will continue to monitor.   Dara Hoyer

## 2013-10-08 NOTE — ED Provider Notes (Addendum)
Pt seen upon his arrival. EKG reviewed. It shows obvious ST elevations. Code "STEMI". Was called. Discussed case with cardiologist on call, Dr. Mitzie Na. He is present emergency room within 10 minutes of the initiation of the code. Patient examined. He remains medically stable. Diaphoretic. IVs were established. Started with IV heparin bolus is. Given morphine and Zofran for pain. Taken emergently the cath lab. He remains stable and in sinus rhythm in the emergency department  I saw and evaluated the patient, reviewed the resident's note and I agree with the findings and plan.     Roney Marion, MD 10/08/13 1412  Roney Marion, MD 10/16/13 [phone]

## 2013-10-08 NOTE — Progress Notes (Signed)
  Echocardiogram 2D Echocardiogram has been performed.  [name] 10/08/2013, 3:32 PM

## 2013-10-08 NOTE — Progress Notes (Signed)
CRITICAL VALUE ALERT  Critical value received:  Troponin 11.92  Date of notification:  10/08/13  Time of notification:  0843  Critical value read back:yes  Nurse who received alert:  Deneise Lever  MD notified (1st page):  Lab value expected. MD not notified.  Time of first page:  [phone]  MD notified (2nd page):  Time of second page:  Responding MD:   Time MD responded:

## 2013-10-08 NOTE — Progress Notes (Signed)
CARDIAC REHAB PHASE I   PRE:  Rate/Rhythm: 59 SB  BP:  Supine: 120/69  Sitting:   Standing:    SaO2: 100 RA  MODE:  Ambulation: 350 ft   POST:  Rate/Rhythm: 69 SR  BP:  Supine:   Sitting: 154/82  Standing:    SaO2: 100 RA 1415-1450 Assisted X 1 to ambulate. Gait steady. VS stable Pt denies any cp or SOB with ambulation. Started MI and stent education with pt. Discussed smoking cessation with pt. He states that " I enjoy smoking" He does not seem interested in quitting. I gave pt tips for quitting and coaching contact number.Pt back to bed after walk, pt to have echo completed. Call light in reach. We will follow pt tomorrow.  Melina Copa RN 10/08/2013 2:51 PM

## 2013-10-08 NOTE — Progress Notes (Signed)
Chaplain paged to ED for Code Stemi. No family members present. Patient was transferred to the Cath Lab. Chaplain asked the ED secretary to follow up if and when family arrives.   10/07/13 2246  Clinical Encounter Type  Visited With Patient not available;Health care provider  Visit Type Initial;Code;ED

## 2013-10-08 NOTE — CV Procedure (Signed)
PROCEDURE:  Left heart catheterization with selective coronary angiography, venous and arterial conduit angiography.  PCI of the distal portion of the SVG to PDA graft. Aspiration thrombectomy.  INDICATIONS:  Acute inferior ST elevation MI  The risks, benefits, and details of the procedure were explained to the patient.  The patient verbalized understanding and wanted to proceed.  Informed written consent was obtained.  PROCEDURE TECHNIQUE:  After Xylocaine anesthesia a 53F sheath was placed in the right femoral artery with a single anterior needle wall stick.   Left coronary angiography was done using a Judkins L4 guide catheter.  Right coronary angiography was done using a Judkins R4 guide catheter.  Left ventriculography was done using a pigtail catheter. The JR 4 guide was used  to engage the subclavian and inject the LIMA.  The  SVG to RCA was engaged using a JR 4 guiding catheter. The radial to OM was engaged using an AL 2.  Hemostasis will be obtained using manual compression.   CONTRAST:  Total of 125 cc.  COMPLICATIONS:  None.    HEMODYNAMICS:  Aortic pressure was 135/75; LV pressure was 129/10; LVEDP 15 mm Hg.  There was no gradient between the left ventricle and aorta.    ANGIOGRAPHIC DATA:   The left main coronary artery is patent.  The left anterior descending artery is a medium size vessel. In the mid vessel, there is a moderate stenosis. The distal vessel has competitive flow. The first diagonal is patent. The second diagonal has moderate to severe proximal disease.  The left circumflex artery is a large vessel. Proximally, there is a moderate stenosis. There is a large OM1 which appears to have some competitive flow in the distal portion, from a bypass graft. There is a medium-sized OM 2 which is patent..  The right coronary artery is a medium size dominant vessel. The mid vessel is occluded. The distal RCA system appears to fill by collaterals from the left system.  The  LIMA to LAD appears patent.  The radial graft to OM appears patent. There is some mild proximal disease in the graft.  The SVG to RCA is occluded, TIMI 0 flow. This is the culprit for today's presentation. After intervention, it was noted that the proximal stent is patent but is quite undersized compared to the rest of the graft. It is difficult to tell whether this is restenosis. The midportion of the graft is very large. The PDA has some competitive flow distally. There may be some distal embolization to the small branches off of the distal PDA.  The posterolateral branch appears large and patent.  LEFT VENTRICULOGRAM:  Left ventricular angiogram was not done.  LVEDP was 15 mmHg.  PCI NARRATIVE: A JR 4 guiding catheters using age the SVG to RCA. A pro-water wire was placed across the area disease. With the wire crossing, there appeared to be some debris in the PDA which later cleared and likely went more distal. The lesion in the SVG to RCA was too distal and did not allow for a landing zone for any type of distal protection device. TIMI-3 flow was restored just with the wire. Aspiration thrombectomy was performed with aspiration catheter. A 2.5 x 12 balloon was used to predilate the 99% stenosis in the distal portion of the graft.  Intracoronary adenosine was administered. A 3.5 x 12 bare-metal, vision stent was deployed. A 4.0 x 8 noncompliant balloon was used to post dilate the stent. TIMI-3 flow was restored.  The patient's pain resolved.  IMPRESSIONS:  1. Successful BMS to distal SVG to PDA graft. 3.5 x 12, posdilated to 4.1 mm, to treat occluded vein graft causing inferior ST elevation MI. The proximal stent appears small for the size of the remainder of the vein graft. The patient did not report any exertional angina prior to this. The catheter did damp when deeply engaged in the previously stented portion proximally.    2.   LVEDP 15 mmHg.    3.    Patent LIMA to LAD. Patent radial to OM.   Occluded native mid RCA.  Moderate, diffuse circumflex/OM 2 disease. Moderate MID native LAD disease.  RECOMMENDATION:  The patient will need dual antiplatelet therapy for at least a month. Given that he has had to ST elevation MI as in this RCA graft, would consider dual antiplatelet therapy for a longer period of time if he can tolerate this from a bleeding standpoint.  He needs to stop smoking. He needs aggressive secondary prevention. He'll be watched in the CCU. Dr. Allyson Sabal to follow in the morning and to provide followup care.

## 2013-10-08 NOTE — Progress Notes (Addendum)
   Subjective:  Feels fine - no more CP or SOB.  Groin feels fine.  Mood is stable.  Objective:  Vital Signs in the last 24 hours: Temp:  [97.5 F (36.4 C)-98.3 F (36.8 C)] 98.3 F (36.8 C) (10/16 0730) Pulse Rate:  [51-73] 59 (10/16 0700) Resp:  [10-18] 10 (10/16 0700) BP: (113-185)/(66-102) 113/66 mmHg (10/16 0700) SpO2:  [98 %-100 %] 98 % (10/16 0700) Arterial Line BP: (133-153)/(65-79) 152/74 mmHg (10/16 0410) Weight:  [191 lb 5.8 oz (86.8 kg)-197 lb (89.359 kg)] 191 lb 5.8 oz (86.8 kg) (10/16 0045)  Intake/Output from previous day: 10/15 0701 - 10/16 0700 In: 500 [P.O.:120; I.V.:380] Out: 750 [Urine:750] Intake/Output from this shift:   Physical Exam: General appearance: alert, cooperative, appears stated age and no distress Neck: no carotid bruit and no JVD Lungs: mild bibasal rales, non-labored. Heart: regular rate and rhythm, S1, S2 normal, no murmur, click, rub or gallop and normal apical impulse Abdomen: soft, non-tender; bowel sounds normal; no masses,  no organomegaly Extremities: extremities normal, atraumatic, no cyanosis or edema and R Groin site c/d/i - dressing in place Pulses: 1+ BLE Neurologic: Mental status: Alert, oriented, thought content appropriate, affect: normal  Lab Results:  Recent Labs  10/07/13 2244 10/08/13 0415  WBC 8.3 5.5  HGB 11.9* 10.2*  PLT 183 131*    Recent Labs  10/07/13 2244 10/08/13 0415  NA 137 136  K 3.7 4.0  CL 99 101  CO2 25 24  GLUCOSE 100* 97  BUN 9 9  CREATININE 0.75 0.66    Recent Labs  10/08/13 0230  TROPONINI 2.87*    Imaging: Imaging results have been reviewed  Cardiac Studies: Cardiac CATH Report Reviewed. - SVG-RCA 100% occlusion with some distal embolization, Asp Thrombectomy & PCI of distal SVG - 3.5 mm x 12 mm Vision BMS, proximal 2.75 mm BMS patent, but undersized. LV Gram not performed.  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  Hopefully reperfusion was rapid enough to prevent substantial injury.  On ASA & Brilinta - can get 1 month free; would probably convert to Plavix after 1st month to complete 1 yr of DAPT following ACS/STEMI presentation.   On PPI  With HR in 50s - will only start very low dose BB.  Can d/c HCTZ & use ACE-I.  On Gemfibrozil & Lovaza (? If he can afford Lovaza as OP, but is listed) -- ? If we can use statin instead. Will check FLP & LFTs. -- for now, d/c Gemfibrozil & start Lipitor.  Smoking cessation counseling ordered; will re-inforce.  Will need to establish OP f/u - last time he left AMA.(states that he was manic that time) -- has Prednisone ordered (not currently taking)  [facility] today after bedrest.  Anticipate transfer to TCU vs. Tele tomorrow, depending upon BP etc  CM & CSW consulted - h/o of being homeless; need to ensure stable d/c   LOS: 1 day    Taitum Menton W 10/08/2013, 8:02 AM

## 2013-10-08 NOTE — Clinical Social Work Note (Addendum)
RNCM has called [facility], as pt was admitted to [facility] location before coming to [facility]. CSW was consulted for homelessness, but pt is not homeless, per [facility]. CSW signing off on pt. RNCM continues to follow pt, and CSW will provide services if CSW needs arise.    Maryclare Labrador, MSW, [facility] Clinical Social Worker [phone]

## 2013-10-09 DIAGNOSIS — I25810 Atherosclerosis of coronary artery bypass graft(s) without angina pectoris: Secondary | ICD-10-CM

## 2013-10-09 DIAGNOSIS — E781 Pure hyperglyceridemia: Secondary | ICD-10-CM | POA: Diagnosis present

## 2013-10-09 LAB — LIPID PANEL
Cholesterol: 176 mg/dL (ref 0–200)
HDL: 27 mg/dL — ABNORMAL LOW (ref >39)
LDL Cholesterol: UNDETERMINED mg/dL (ref 0–99)
Triglycerides: 613 mg/dL — ABNORMAL HIGH (ref <150)
VLDL: UNDETERMINED mg/dL (ref 0–40)

## 2013-10-09 MED ORDER — LOSARTAN POTASSIUM 25 MG PO TABS
25.0000 mg | ORAL_TABLET | Freq: Every day | ORAL | Status: DC
  Administered 2013-10-10: 25 mg via ORAL
  Filled 2013-10-09 (×2): qty 1

## 2013-10-09 MED ORDER — ACETAMINOPHEN 325 MG PO TABS: 650.0000 mg | ORAL_TABLET | ORAL | Status: DC | PRN

## 2013-10-09 MED ORDER — METOPROLOL TARTRATE 12.5 MG HALF TABLET: 12.5000 mg | ORAL_TABLET | Freq: Two times a day (BID) | ORAL | Status: DC

## 2013-10-09 MED ORDER — TICAGRELOR 90 MG PO TABS: 90.0000 mg | ORAL_TABLET | Freq: Two times a day (BID) | ORAL | Status: DC

## 2013-10-09 MED ORDER — GEMFIBROZIL 600 MG PO TABS
600.0000 mg | ORAL_TABLET | Freq: Two times a day (BID) | ORAL | Status: DC
  Administered 2013-10-09 – 2013-10-10 (×2): 600 mg via ORAL
  Filled 2013-10-09 (×4): qty 1

## 2013-10-09 NOTE — Progress Notes (Signed)
Report called. Pt transferred by RN to [phone]. Pt. Received by RN in room [phone]

## 2013-10-09 NOTE — Progress Notes (Signed)
Patient ID: [name], male   DOB: 1956/02/15, 57 y.o.   MRN: [id number]  Subjective:  Feels fine - no more CP or SOB.  Groin feels fine.  Mood is stable. Walked in hallway with no problems.  Objective:  Vital Signs in the last 24 hours: Temp:  [97.8 F (36.6 C)-98.6 F (37 C)] 98.4 F (36.9 C) (10/17 0722) Pulse Rate:  [51-77] 62 (10/17 0800) Resp:  [7-13] 8 (10/17 0800) BP: (96-151)/(42-85) 110/68 mmHg (10/17 0800) SpO2:  [95 %-100 %] 95 % (10/17 0800)  Intake/Output from previous day: 10/16 0701 - 10/17 0700 In: 1080 [P.O.:1080] Out: 2200 [Urine:2200] Intake/Output from this shift:   Physical Exam: General appearance: alert, cooperative, appears stated age and no distress Neck: no carotid bruit and no JVD Lungs: CTAB, non-labored. Heart: RRR, S1, S2 normal, no murmur, click, rub or gallop and normal apical impulse Abdomen: soft, non-tender; bowel sounds normal; no masses,  no organomegaly Extremities: extremities normal, atraumatic, no cyanosis or edema and R Groin site c/d/i  Pulses: 1+ BLE;  Neurologic: Mental status: Alert, oriented, thought content appropriate, affect: normal  Lab Results:  Recent Labs  10/07/13 2244 10/08/13 0415  WBC 8.3 5.5  HGB 11.9* 10.2*  PLT 183 131*    Recent Labs  10/07/13 2244 10/08/13 0415  NA 137 136  K 3.7 4.0  CL 99 101  CO2 25 24  GLUCOSE 100* 97  BUN 9 9  CREATININE 0.75 0.66    Recent Labs  10/08/13 0230 10/08/13 0711  TROPONINI 2.87* 11.92*    Imaging: Imaging results have been reviewed  Cardiac Studies: Cardiac CATH Report Reviewed. - SVG-RCA 100% occlusion with some distal embolization, Asp Thrombectomy & PCI of distal SVG - 3.5 mm x 12 mm Vision BMS, proximal 2.75 mm BMS patent, but undersized. LV Gram not performed.  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  Hopefully reperfusion was rapid enough to prevent substantial injury.  On ASA & Brilinta - can get 1 month free; ? If he will need to  Convert to Plavix after 1st month to complete 1 yr of DAPT following ACS/STEMI presentation.   On PPI  With HR in 50s -tolerating low dose BB.  BP stable off of HCTZ & with new ACE-I / BB.  On Gemfibrozil & Lovaza @ home -- with lipid panel showing significant hypertriglyceridemia & low HDL - atherogenic profile.  Will need to restart fibrate in liew of statin. Convert back to Gemfibrozil from Lipitor.  Smoking cessation counseling ordered; will re-inforce. - 4 min discussion on options for cessation assistance.  He does not seem interested.  Will need to establish OP f/u - last time he left AMA.(states that he was manic that time) -- says he has f/u @ [facility].  -- will need CM/CSW assistance with getting him back into the system for f/u post PCI.  [facility] today after bedrest.   Ttransfer to Tele today, & likely home tomorrow.  CM & CSW consulted -no longer  homeless; need to ensure stable d/c - both for obtaining medications & for Cardiology f/u.   LOS: 2 days    HARDING,DAVID W 10/09/2013, 8:27 AM

## 2013-10-09 NOTE — Progress Notes (Signed)
Called 3W to give report.  Nurse taking patient was in room with another patient and unavailable to take report at current time.  Will try again in 10 min.

## 2013-10-09 NOTE — Discharge Instructions (Signed)
Non-ST Segment Elevation Myocardial Infarction  °A heart attack occurs when a blood vessel on the surface of the heart (coronary artery) is blocked and interrupts blood supply to the heart muscle. This causes that area of the heart muscle to permanently scar. This blockage may be caused by cholesterol buildup (atherosclerotic plaque) within a coronary artery. The plaque cracks which creates a rough surface where blood cells attach, forming a clot. °Chest discomfort that happens with exertion and goes away with rest is called angina. This is a warning signal that blood flow to the heart is not enough. Angina that does not go away or becomes worse may mean that there is actual heart damage and a scar may form. °ST elevation refers to waveforms seen on an EKG or tracing of the electrical activity in the heart. Your provider can tell if there has been damage to your heart from changes in the normal pattern. A NSTEMI heart attack may be smaller and not as serious as one with typical changes. After an NSTEMI, there is a higher chance of another heart attack and returning angina after you have recovered. °CAUSES °· Plaque in the coronary arteries. It builds up over many years. °· Smoking. Smoking reduces the oxygen supply to the heart because carbon monoxide is more readily carried by the blood cells. Smoking also makes plaque develop faster. STOP SMOKING. °· A narrowing (spasm) of a coronary artery. °· Increased oxygen use. This can occur during extreme stress or activities. °· Use of stimulants. Stimulants, such as cocaine and amphetamines, dangerously and unpredictably increase the oxygen needs of the heart. They can make the heart beat faster and unevenly. Stop using street drugs. °RISK FACTORS °· Age Risk increases with age for both men and women. °· Menopause After menopause and age 70, women have heart attacks at the same rate as men. °· Diabetes Maintaining a normal blood sugar and eating a balanced diet lessens  the chance of a heart attack. °· Obesity Try to maintain a close to ideal body weight or as your caregiver suggests. °· High blood pressure (hypertension) Makes the heart work harder. °· High cholesterol Promotes the buildup of plaque in the blood vessels. °SYMPTOMS  °Chest pain, especially if it radiates down the left arm, up into the neck, jaw or teeth, often comes from the heart. This is even more likely if the pain leaves with rest. Problems with the heart may mimic indigestion and anyone older than 35 should not ignore this symptom. Other typical problems (symptoms) include: °· Profound sweating. °· Feeling faint, weak or light-headed. °· Feeling sick to your stomach. °· Loss of normal color. °DIAGNOSIS  °A combination of your history, an exam, EKG findings and blood work results determine if you have had a heart attack. °It is very important to seek medical care right away for episodes of chest pain. The sooner you get treatment, the sooner you may return to your normal activities. If a large area of heart muscle lacks oxygen and medical care is not provided; a weak heart muscle, heart failure or sudden death may result. °WHAT MAY HAPPEN IF A HEART ATTACK IS SUSPECTED °· Aspirin may be given, if you are able to take it. This makes your blood "thinner" (less likely to clot). °· Thrombolytics ("clot busters") may be given as long as it is safe and if a cardiac cath lab is not available. °· A heart monitor will display the electrical activity of your heart to check for abnormal beats   or rhythm. °· An EKG is a painless procedure that gives information about areas of heart muscle that may be injured. °· Your blood oxygen level may be monitored by a painless sensor attached to your finger or ear. °· Blood tests are used to find out whether the heart muscle has been damaged. °· A chest X-ray can give some indication of how well the heart and lungs are functioning. °· A coronary angiogram may be performed. This is a  procedure where dye is injected into your coronary arteries and x-rays are taken to determine which blood vessels are blocked. °· Angioplasty or stent placement may be used during an angiogram to open a blocked vessel. °· An echocardiogram is a painless and risk free external sonar exam that may be used to examine your heart valves, muscle function and blood flow within the heart. °TREATMENT °· Length of hospital stays vary from a couple days to a week. This depends on the amount of heart damage and the severity of any complications. °· If your symptoms are a false alarm and no heart disease is found, the stay is often less than 24 hours. °· Medications may be used for reducing pain, keeping your heart beat regularly, helping your breathing and controlling your blood pressure. °· Blood thinners may be used to dissolve clots. °· If you have a single small artery blockage and no heart damage, you may have a balloon angioplasty. This procedure may displace the blockage and restore normal heart circulation. Stents most often follow angioplasty right away. Stents are small wire mesh-like tubes that help keep the artery open. The earlier this is done, the better. °· Severe heart problems may require open heart surgery. This is a procedure where blocked arteries are bypassed with small veins from your legs or arteries from inside your chest wall. If important arteries are involved, or if your chest pain continues, this may be the best method to make ensure your long-term survival. °ABOUT YOUR HOSPITAL STAY °· While you are in the hospital, you may be placed on a low salt, low fat diet and given a stool softener. The stool softener will keep you from straining during a bowel movement. °· Oxygen may be given to increase oxygen delivery to the heart. °· Medications may be prescribed, while in the hospital, to help your heart and lungs work better. °· Before discharge from the hospital, a stress test may be performed. In this  test, an ECG measures how well your heart works with exercise. The test may be done while you are walking on a treadmill, using a stationary bicycle or after being given medications to make your heart beat faster. It can be used to judge the safety of your proposed activity levels. It may provide a starting point for your exercise program. °HOME CARE INSTRUCTIONS  °· Follow the treatment plan your caregiver prescribes. °· Carry medications, such as nitroglycerine, with you at all times, if directed. °· Make a list of every medicine you are taking. Keep it up-to-date and with you all the time. °· Get help from your caregiver or pharmacist to learn the following about each medicine: °· Why you are taking it. °· What time of day to take it. °· Possible side effects. °· Foods to take with it or avoid. °· When to stop taking it. °· Try to maintain normal blood lipid levels. °· Eat a heart healthy diet with salt and fat restrictions as advised. °· Activity Level Everyone heals at a   different rate. Decisions about when you may go back to work, start to exercise or have sex should be made with the guidance of your caregivers. Pace your activities to avoid shortness of breath or chest pain. °· Weight Monitoring Weigh yourself every day. You should weigh yourself in the morning after you urinate and before you eat breakfast. Wear the same amount of clothing when you weigh yourself. Record your weight daily. Bring your recorded weights to your clinic visits. Tell your caregiver right away if you have gained 3 lb/1.4 kg in 1 day or 5 lb/2.3 kg in a week. °· Blood pressure monitoring This should be done as often as you are told to check it. You can get a home blood pressure cuff at your drugstore. Record these values and bring them with you for your health checks. °· Smoking If you are currently a smoker, it is time to quit. Nicotine makes your heart work harder. Do not use nicotine gum or patches before checking with your  doctor. Find a support group or therapist to help you quit. °· Follow-up Be sure to make and keep an appointment with your caregiver. Appointments with your cardiologist and other caregivers may also be needed. °SEEK IMMEDIATE MEDICAL CARE IF:  °· You have severe chest pain, especially if the pain is crushing or pressure-like and spreads to the arms, back, neck or jaw. THIS IS AN EMERGENCY. Do not wait to see if the pain will go away. Get medical help at once. Call your local emergency services (911 in the [place] DO NOT drive yourself to the hospital. °· You start sweating, feel sick to your stomach or are short of breath. °· Your weight increases by 3 lb/1.4 kg or more in 1 day or 5 lb/2.3 kg in a week. °· You notice increasing shortness of breath during rest, sleeping or with activity. °· You develop an increase in angina or develop chest pain which is unusual for you. °· You are unable to sleep because you cannot breathe. °MAKE SURE YOU:  °· Understand these instructions. °· Will watch your condition. °· Will get help right away if you are not doing well or get worse. °Document Released: 07/09/2005 Document Revised: 03/03/2012 Document Reviewed: 02/06/2008 °[employer]® Patient Information ©2014 [employer]. ° °

## 2013-10-09 NOTE — Progress Notes (Signed)
CARDIAC REHAB PHASE I   PRE:  Rate/Rhythm: 66 SR  BP:  Supine:   Sitting:   Standing: 100/70   SaO2:   MODE:  Ambulation: 350 ft   POST:  Rate/Rhythm: 78 SR  BP:  Supine:   Sitting: 110/70  Standing:    SaO2:     Patient tolerated ambulation well.  He had just finished walking approximately 550 ft independently without difficulty. Reinforced smoking cessation, patient states he has quit as long as 30 days, but will awaken from sleep occasionally having a PTSD attack and he will start back.  Discussed activity restrictions and weight lifting limits.  Patient states he needs to move from his current residence the beginning of next week.. Discussed asking his sister to assist so patient will follow activity restrictions post MI.  Refused entry into phase II cardiac rehab, he has been in the program in the past.    [id number]  Cindra Eves RN, BSN 10/09/2013 [phone]

## 2013-10-10 DIAGNOSIS — Z95820 Peripheral vascular angioplasty status with implants and grafts: Secondary | ICD-10-CM

## 2013-10-10 DIAGNOSIS — I2589 Other forms of chronic ischemic heart disease: Secondary | ICD-10-CM

## 2013-10-10 LAB — BASIC METABOLIC PANEL WITH GFR
BUN: 13 mg/dL (ref 6–23)
CO2: 21 meq/L (ref 19–32)
Calcium: 8.8 mg/dL (ref 8.4–10.5)
Chloride: 99 meq/L (ref 96–112)
Creatinine, Ser: 0.84 mg/dL (ref 0.50–1.35)
GFR calc Af Amer: >90 mL/min
GFR calc non Af Amer: >90 mL/min
Glucose, Bld: 106 mg/dL — ABNORMAL HIGH (ref 70–99)
Potassium: 3.6 meq/L (ref 3.5–5.1)
Sodium: 134 meq/L — ABNORMAL LOW (ref 135–145)

## 2013-10-10 LAB — CBC
HCT: 30.1 % — ABNORMAL LOW (ref 39.0–52.0)
Hemoglobin: 10.5 g/dL — ABNORMAL LOW (ref 13.0–17.0)
MCH: 29.9 pg (ref 26.0–34.0)
MCHC: 34.9 g/dL (ref 30.0–36.0)
RBC: 3.51 MIL/uL — ABNORMAL LOW (ref 4.22–5.81)

## 2013-10-10 LAB — TROPONIN I: Troponin I: 2.62 ng/mL (ref <0.30)

## 2013-10-10 MED ORDER — METOPROLOL SUCCINATE ER 25 MG PO TB24
25.0000 mg | ORAL_TABLET | Freq: Every day | ORAL | Status: DC
  Filled 2013-10-10: qty 1

## 2013-10-10 MED ORDER — NITROGLYCERIN 0.3 MG SL SUBL
0.3000 mg | SUBLINGUAL_TABLET | SUBLINGUAL | Status: DC | PRN
  Filled 2013-10-10: qty 100

## 2013-10-10 NOTE — Progress Notes (Signed)
CARDIAC REHAB PHASE I   PRE:  Rate/Rhythm:77 SR Occ PVC  BP:  Supine:   Sitting: 124/74  Standing: 1   SaO2: 198RA  MODE:  Ambulation: 500 ft   POST:  Rate/Rhythem: 80  BP:  Supine:   Sitting: 134/76  Standing:    SaO2: 97 RA  11:10 am - 11-30   Ambulated with assist x1, somewhat unsteady  Due to hip pain 6/10 .  Planning for discharge today.  He goes to [facility] for water exercise and plans to continue, encouraged more than one day per week.  Nurse aware of pain level and is bringing medication.   Jackey Loge

## 2013-10-10 NOTE — Discharge Summary (Signed)
Patient ID: [name],  MRN: [id number], DOB/AGE: 09/08/1956 57 y.o.  Admit date: 10/07/2013 Discharge date: 10/10/2013  Primary Care Provider: [facility] Primary Cardiologist: [facility]  Discharge Diagnoses Principal Problem:   STEMI (ST elevation myocardial infarction) Active Problems:   CAD - Inf STEMI in 2013 & now in 09/2013   S/P angioplasty with stent SVG-RCA 3/13 (left AMA without Plavix), and again SVG-RCA BMS 10/09/13   CAD, CABG X 27 March 2008   Bipolar 1 disorder   Homeless   HEPATITIS C   HYPERLIPIDEMIA   Noncompliance with diet and medication regimen   Smoker   PVD, Lt [facility]BPG at [facility] Oct 2012   Ischemic cardiomyopathy, EF 40% 2009   Hypertriglyceridemia    Procedures: 10/17- Left heart catheterization with selective coronary angiography, PCI of the distal portion of the SVG to PDA graft. Aspiration thrombectomy and successful BMS to distal SVG to PDA graft. 3.5 x 12, posdilated to 4.1 mm, to treat occluded vein graft causing inferior ST elevation MI.     Hospital Course:  57 y/o bipolar, homeless [place] pt with a history of CAD, s/p CABG X 27 March 2008. We saw him March 3013 when he presented with an inferior STEMI. He was treated with a PCI and BMS to the SVG-RCA and then signed out AMA after his sheath was pulled. He did not leave with Plavix. He was admitted again 10/07/13 with an inferior STEMI.  The pt had PCI with BMS to the SVG-RCA by Dr Eldridge Dace 10/08/13. His Troponin peaked at 11.92. Echo showed an EF of 50-55%. He tolerated this well and was cooperative post PCI. We will arrange for him to get 30 days of Brilinta free and then he should be on Plavix for a year. He has been instructed to follow up with the [place]. We will see him in follow up in one to two weeks to make sure he is doing OK and has arranged follow up.  Discharge Vitals:  Blood pressure 135/82, pulse 62, temperature 98.6 F (37 C), temperature source Oral, resp. rate 20, height 5\' 9"  (1.753 m),  weight 191 lb 5.8 oz (86.8 kg), SpO2 99.00%.    Labs: Results for orders placed during the hospital encounter of 10/07/13 (from the past 48 hour(s))  LIPID PANEL     Status: Abnormal   Collection Time    10/09/13  5:43 AM      Result Value Range   Cholesterol 176  0 - 200 mg/dL   Triglycerides 782 (*) <150 mg/dL   HDL 27 (*) >95 mg/dL   Total CHOL/HDL Ratio 6.5     VLDL UNABLE TO CALCULATE IF TRIGLYCERIDE OVER 400 mg/dL  0 - 40 mg/dL   LDL Cholesterol UNABLE TO CALCULATE IF TRIGLYCERIDE OVER 400 mg/dL  0 - 99 mg/dL   Comment:            Total Cholesterol/HDL:CHD Risk     Coronary Heart Disease Risk Table                         Men   Women      1/2 Average Risk   3.4   3.3      Average Risk       5.0   4.4      2 X Average Risk   9.6   7.1      3 X Average Risk  23.4   11.0  Use the calculated Patient Ratio     above and the CHD Risk Table     to determine the patient's CHD Risk.                ATP III CLASSIFICATION (LDL):      <100     mg/dL   Optimal      098-119  mg/dL   Near or Above                        Optimal      130-159  mg/dL   Borderline      147-829  mg/dL   High      >562     mg/dL   Very High  BASIC METABOLIC PANEL     Status: Abnormal   Collection Time    10/10/13  6:30 AM      Result Value Range   Sodium 134 (*) 135 - 145 mEq/L   Potassium 3.6  3.5 - 5.1 mEq/L   Chloride 99  96 - 112 mEq/L   CO2 21  19 - 32 mEq/L   Glucose, Bld 106 (*) 70 - 99 mg/dL   BUN 13  6 - 23 mg/dL   Creatinine, Ser 1.30  0.50 - 1.35 mg/dL   Calcium 8.8  8.4 - 86.5 mg/dL   GFR calc non Af Amer >90  >90 mL/min   GFR calc Af Amer >90  >90 mL/min   Comment: (NOTE)     The eGFR has been calculated using the CKD EPI equation.     This calculation has not been validated in all clinical situations.     eGFR's persistently <90 mL/min signify possible Chronic Kidney     Disease.  CBC     Status: Abnormal   Collection Time    10/10/13  6:30 AM      Result Value  Range   WBC 5.1  4.0 - 10.5 K/uL   RBC 3.51 (*) 4.22 - 5.81 MIL/uL   Hemoglobin 10.5 (*) 13.0 - 17.0 g/dL   HCT 78.4 (*) 69.6 - 29.5 %   MCV 85.8  78.0 - 100.0 fL   MCH 29.9  26.0 - 34.0 pg   MCHC 34.9  30.0 - 36.0 g/dL   RDW 28.4  13.2 - 44.0 %   Platelets 132 (*) 150 - 400 K/uL  TROPONIN I     Status: Abnormal   Collection Time    10/10/13  6:30 AM      Result Value Range   Troponin I 2.62 (*) <0.30 ng/mL   Comment:            Due to the release kinetics of cTnI,     a negative result within the first hours     of the onset of symptoms does not rule out     myocardial infarction with certainty.     If myocardial infarction is still suspected,     repeat the test at appropriate intervals.     REPEATED TO VERIFY     CRITICAL VALUE NOTED.  VALUE IS CONSISTENT WITH PREVIOUSLY REPORTED AND CALLED VALUE.    Disposition:  Follow-up Information   Follow up with Corine Shelter K, PA-C. (office will call you)    Specialty:  Cardiology   Contact information:   [address] [place] [address] [phone]       Discharge Medications:    Medication List  STOP taking these medications       losartan 25 MG tablet  Commonly known as:  COZAAR     metoprolol succinate 100 MG 24 hr tablet  Commonly known as:  TOPROL-XL      TAKE these medications       acetaminophen 325 MG tablet  Commonly known as:  TYLENOL  Take 2 tablets (650 mg total) by mouth every 4 (four) hours as needed for pain or fever.     albuterol 108 (90 BASE) MCG/ACT inhaler  Commonly known as:  PROVENTIL HFA;VENTOLIN HFA  Inhale 2 puffs into the lungs every 6 (six) hours as needed. WHEEZING     aspirin EC 81 MG tablet  Take 81 mg by mouth daily.     atorvastatin 80 MG tablet  Commonly known as:  LIPITOR  Take 80 mg by mouth daily.     CALCIUM 500 PO  Take 1 tablet by mouth daily.     colchicine 0.6 MG tablet  Take 0.6 mg by mouth 2 (two) times daily.     divalproex 500 MG DR  tablet  Commonly known as:  DEPAKOTE  Take 500 mg by mouth at bedtime.     ferrous sulfate 325 (65 FE) MG tablet  Take 325 mg by mouth daily with breakfast.     Fish Oil 1000 MG Caps  Take 2,000 mg by mouth 2 (two) times daily.     metoprolol tartrate 12.5 mg Tabs tablet  Commonly known as:  LOPRESSOR  Take 0.5 tablets (12.5 mg total) by mouth 2 (two) times daily.     nitroGLYCERIN 0.4 MG SL tablet  Commonly known as:  NITROSTAT  Place 0.4 mg under the tongue every 5 (five) minutes as needed for chest pain. Repeat every 5 minutes up to 3 doses.  If no relief seek medical help (Do not take Cialis, Viagra or Levitra while you are on this medication)     omeprazole 20 MG capsule  Commonly known as:  PRILOSEC  Take 40 mg by mouth 2 (two) times daily.     Ticagrelor 90 MG Tabs tablet  Commonly known as:  BRILINTA  Take 1 tablet (90 mg total) by mouth 2 (two) times daily.     traMADol 50 MG tablet  Commonly known as:  ULTRAM  Take 100 mg by mouth every 6 (six) hours as needed. For pain. Maximum dose= 8 tablets per day     vitamin C 1000 MG tablet  Take 1,000 mg by mouth daily.     ZYLOPRIM 300 MG tablet  Generic drug:  allopurinol  Take 450 mg by mouth daily.        Duration of Discharge Encounter: Greater than 30 minutes including physician time.  Signed, Corine Shelter PA-C 10/10/2013 9:51 AM  I saw & personally examined the pt this AM.  He was looking great.   S/P angioplasty with stent SVG-RCA 3/13 (left AMA without Plavix), and again SVG-RCA BMS 10/09/13  EF by Echo improved to 50-55% with no HF findings on exam. -- moderate inferior/inferoseptal HK, mild AS  Ready for d/c to home today:  ASA + Brilinta - has card  Will need ROV with [facility], but gave him our [facility] # in case this cannot be set up for close f/u.  HR has been low -- have reduced BB dose to Toprol 25 mg from 100mg   On ARB, so HCTZ stopped.  Smoking cessation counseling provided   Hipertriglyceridemia --  continued on Lovaza & Gemfibrozil (not Statin   Needs ROC appt @ [facility].  Marykay Lex, M.D., M.S. [facility] [address] [place], [place]  [address]  [phone] Pager # [phone] 10/10/2013 3:35 PM

## 2013-10-10 NOTE — Progress Notes (Signed)
   Pt Seen & Examined; chart reviewed.  Feels good today.  NO further CP/SOB.  Ambulating in hall - ready to go home.  Vital Signs: BP 135/82  Pulse 62  Temp(Src) 98.6 F (37 C) (Oral)  Resp 20  Ht 5\' 9"  (1.753 m)  Wt 191 lb 5.8 oz (86.8 kg)  BMI 28.25 kg/m2  SpO2 99% Physical Exam:  General appearance: alert, cooperative, appears stated age and no distress  Neck: no carotid bruit and no JVD  Lungs: CTAB, non-labored.  Heart: RRR, S1, S2 normal, 1/6 SEM; click, rub or gallop and normal apical impulse  Abdomen: soft, non-tender; bowel sounds normal; no masses, no organomegaly  Extremities: extremities normal, atraumatic, no cyanosis or edema and R Groin site c/d/i  Pulses: 1+ BLE;  Neurologic: Mental status: Alert, oriented, thought content appropriate, affect: normal  Labs Reviewed - renal Fxn stable, Troponin appropriately reduced.  Principal Problem:   STEMI (ST elevation myocardial infarction) Active Problems:   CAD, CABG X 27 March 2008   Ischemic cardiomyopathy, EF 40% 2009   CAD - Inf STEMI in 2013 & now in 09/2013   Hypertriglyceridemia   HYPERLIPIDEMIA   Homeless   Smoker   HEPATITIS C   Bipolar 1 disorder   Noncompliance with diet and medication regimen   PVD, Lt [facility]G at [facility] Oct 2012   S/P angioplasty with stent SVG-RCA 3/13 (left AMA without Plavix), and again SVG-RCA BMS 10/09/13 EF by Echo improved to 50-55% with no HF findings on exam. -- moderate inferior/inferoseptal HK, mild AS  Ready for d/c to home today:  ASA + Brilinta - has card  Will need ROV with [facility], but gave him our [facility] # in case this cannot be set up for close f/u.  HR has been low -- have reduced BB dose to Toprol 25 mg from 100mg   On ARB, so HCTZ stopped.  Smoking cessation counseling provided  Hipertriglyceridemia -- continued on Lovaza & Gemfibrozil (not Statin)  Marykay Lex, M.D., M.S. [facility] [address]  [address] [place], [place]  [address]  [phone] Pager # [phone] 10/10/2013 9:53 AM

## 2013-10-22 ENCOUNTER — Ambulatory Visit: Payer: Non-veteran care | Admitting: Cardiology

## 2014-01-05 ENCOUNTER — Encounter (HOSPITAL_COMMUNITY): Payer: Self-pay | Admitting: *Deleted

## 2014-06-29 ENCOUNTER — Encounter (HOSPITAL_COMMUNITY): Payer: Self-pay | Admitting: Emergency Medicine

## 2014-06-29 ENCOUNTER — Emergency Department (HOSPITAL_COMMUNITY)
Admission: EM | Admit: 2014-06-29 | Discharge: 2014-06-29 | Disposition: A | Discharge status: Home / Self Care | Payer: Non-veteran care | Attending: Emergency Medicine | Admitting: Emergency Medicine

## 2014-06-29 DIAGNOSIS — Z7982 Long term (current) use of aspirin: Secondary | ICD-10-CM | POA: Insufficient documentation

## 2014-06-29 DIAGNOSIS — M10042 Idiopathic gout, left hand: Secondary | ICD-10-CM

## 2014-06-29 DIAGNOSIS — Z79899 Other long term (current) drug therapy: Secondary | ICD-10-CM | POA: Insufficient documentation

## 2014-06-29 DIAGNOSIS — F17200 Nicotine dependence, unspecified, uncomplicated: Secondary | ICD-10-CM | POA: Insufficient documentation

## 2014-06-29 DIAGNOSIS — Z8659 Personal history of other mental and behavioral disorders: Secondary | ICD-10-CM | POA: Insufficient documentation

## 2014-06-29 DIAGNOSIS — Z951 Presence of aortocoronary bypass graft: Secondary | ICD-10-CM | POA: Insufficient documentation

## 2014-06-29 DIAGNOSIS — I2510 Atherosclerotic heart disease of native coronary artery without angina pectoris: Secondary | ICD-10-CM | POA: Insufficient documentation

## 2014-06-29 DIAGNOSIS — Z9861 Coronary angioplasty status: Secondary | ICD-10-CM | POA: Insufficient documentation

## 2014-06-29 DIAGNOSIS — Z7902 Long term (current) use of antithrombotics/antiplatelets: Secondary | ICD-10-CM | POA: Insufficient documentation

## 2014-06-29 DIAGNOSIS — M109 Gout, unspecified: Secondary | ICD-10-CM | POA: Insufficient documentation

## 2014-06-29 DIAGNOSIS — I252 Old myocardial infarction: Secondary | ICD-10-CM | POA: Insufficient documentation

## 2014-06-29 MED ORDER — OXYCODONE-ACETAMINOPHEN 5-325 MG PO TABS: 1.0000 | ORAL_TABLET | ORAL | Status: DC | PRN

## 2014-06-29 MED ORDER — PREDNISONE 10 MG PO TABS: ORAL_TABLET | ORAL | Status: DC

## 2014-06-29 NOTE — ED Notes (Signed)
Patient here with complaint of left wrist pain and swelling, also has swelling of 1st and 2nd knuckles on that hand. Wrist warm to touch. Prior history of gout. States no injury.

## 2014-06-29 NOTE — ED Notes (Signed)
Pt was alert and oriented at discharge.  Pt verbalized understanding of prescriptions and the need for follow up care.  Pt offered a wheelchair at discharge and refused.

## 2014-06-29 NOTE — ED Provider Notes (Signed)
CSN: [id number]     Arrival date & time 06/29/14  0410 History   First MD Initiated Contact with Patient 06/29/14 [phone]     Chief Complaint  Patient presents with  . Gout  . Wrist Pain    Left  . Joint Swelling     (Consider location/radiation/quality/duration/timing/severity/associated sxs/prior Treatment) Patient is a 58 y.o. male presenting with wrist pain. The history is provided by the patient. No language interpreter was used.  Wrist Pain This is a recurrent problem. Pertinent negatives include no chills, fever or numbness. Associated symptoms comments: Pain, swelling and redness of left wrist x 2-3 days, now extending to 3rd MCP. He reports pain is typical of his gouty arthritis. No fever. No injury..    Past Medical History  Diagnosis Date  . Bipolar 2 disorder   . Gout   . Coronary artery disease   . MI (myocardial infarction)   . Acute myocardial infarction of other inferior wall, initial episode of care   . PAD (peripheral artery disease)     Moderate LE atherosclerosis.  . S/P right coronary artery (RCA) stent placement 03/04/2012   Past Surgical History  Procedure Laterality Date  . Coronary stent placement    . Cholecystectomy    . Coronary angioplasty    . Coronary artery bypass graft    . Femoral-popliteal bypass graft    . Toe surgery     History reviewed. No pertinent family history. History  Substance Use Topics  . Smoking status: Current Every Day Smoker -- 1.00 packs/day    Types: Cigarettes  . Smokeless tobacco: Not on file  . Alcohol Use: Yes     Comment: former drinker    Review of Systems  Constitutional: Negative for fever and chills.  Musculoskeletal:       See HPI.  Skin: Positive for color change. Negative for wound.  Neurological: Negative.  Negative for numbness.      Allergies  Prednisone  Home Medications   Prior to Admission medications   Medication Sig Start Date End Date Taking? Authorizing Provider  acetaminophen  (TYLENOL) 325 MG tablet Take 2 tablets (650 mg total) by mouth every 4 (four) hours as needed for pain or fever. 10/09/13   Abelino DerrickLuke K Kilroy, PA-C  albuterol (PROVENTIL HFA;VENTOLIN HFA) 108 (90 BASE) MCG/ACT inhaler Inhale 2 puffs into the lungs every 6 (six) hours as needed. WHEEZING    Historical Provider, MD  allopurinol (ZYLOPRIM) 300 MG tablet Take 450 mg by mouth daily.     Historical Provider, MD  Ascorbic Acid (VITAMIN C) 1000 MG tablet Take 1,000 mg by mouth daily.      Historical Provider, MD  aspirin EC 81 MG tablet Take 81 mg by mouth daily.    Historical Provider, MD  atorvastatin (LIPITOR) 80 MG tablet Take 80 mg by mouth daily.    Historical Provider, MD  Calcium-Magnesium-Vitamin D (CALCIUM 500 PO) Take 1 tablet by mouth daily.    Historical Provider, MD  colchicine 0.6 MG tablet Take 0.6 mg by mouth 2 (two) times daily.     Historical Provider, MD  divalproex (DEPAKOTE) 500 MG DR tablet Take 500 mg by mouth at bedtime.    Historical Provider, MD  ferrous sulfate 325 (65 FE) MG tablet Take 325 mg by mouth daily with breakfast.    Historical Provider, MD  metoprolol tartrate (LOPRESSOR) 12.5 mg TABS tablet Take 0.5 tablets (12.5 mg total) by mouth 2 (two) times daily. 10/09/13   Franky MachoLuke  K Kilroy, PA-C  nitroGLYCERIN (NITROSTAT) 0.4 MG SL tablet Place 0.4 mg under the tongue every 5 (five) minutes as needed for chest pain. Repeat every 5 minutes up to 3 doses.  If no relief seek medical help (Do not take Cialis, Viagra or Levitra while you are on this medication)    Historical Provider, MD  Omega-3 Fatty Acids (FISH OIL) 1000 MG CAPS Take 2,000 mg by mouth 2 (two) times daily.    Historical Provider, MD  omeprazole (PRILOSEC) 20 MG capsule Take 40 mg by mouth 2 (two) times daily.      Historical Provider, MD  Ticagrelor (BRILINTA) 90 MG TABS tablet Take 1 tablet (90 mg total) by mouth 2 (two) times daily. 10/09/13   Abelino DerrickLuke K Kilroy, PA-C  traMADol (ULTRAM) 50 MG tablet Take 100 mg by mouth  every 6 (six) hours as needed. For pain. Maximum dose= 8 tablets per day    Historical Provider, MD   BP 123/77  Pulse 82  Temp(Src) 97.5 F (36.4 C) (Oral)  Resp 17  Ht 5\' 9"  (1.753 m)  Wt 200 lb (90.719 kg)  BMI 29.52 kg/m2  SpO2 99% Physical Exam  Constitutional: He appears well-developed and well-nourished. No distress.  Pulmonary/Chest: Effort normal.  Musculoskeletal:  Left wrist swollen and red, warm to touch, located over ulnar prominence. Redness extends over dorsum of hand. 3rd MCP joint swollen dorsally and tender without redness. Limited ROM secondary to pain.     ED Course  Procedures (including critical care time) Labs Review Labs Reviewed - No data to display  Imaging Review No results found.   EKG Interpretation None      MDM   Final diagnoses:  None    1. Gouty arthritis  No evidence of cellulitis in a patient with a history of gout who reports current symptoms typical of gout pain. Will treat with prednisone and pain control.    Arnoldo HookerShari A Athanasius Kesling, PA-C 06/29/14 [phone]

## 2014-06-29 NOTE — ED Provider Notes (Signed)
Medical screening examination/treatment/procedure(s) were performed by non-physician practitioner and as supervising physician I was immediately available for consultation/collaboration.   EKG Interpretation None       Marguetta Windish M Joyice Magda, MD 06/29/14 0647 

## 2014-06-29 NOTE — Discharge Instructions (Signed)
Gout  Gout is when your joints become red, sore, and swell (inflammed). This is caused by the buildup of uric acid crystals in the joints. Uric acid is a chemical that is normally in the blood. If the level of uric acid gets too high in the blood, these crystals form in your joints and tissues. Over time, these crystals can form into masses near the joints and tissues. These masses can destroy bone and cause the bone to look misshapen (deformed).  HOME CARE   · Do not take aspirin for pain.  · Only take medicine as told by your doctor.  · Rest the joint as much as you can. When in bed, keep sheets and blankets off painful areas.  · Keep the sore joints raised (elevated).  · Put warm or cold packs on painful joints. Use of warm or cold packs depends on which works best for you.  · Use crutches if the painful joint is in your leg.  · Drink enough fluids to keep your pee (urine) clear or pale yellow. Limit alcohol, sugary drinks, and drinks with fructose in them.  · Follow your diet instructions. Pay careful attention to how much protein you eat. Include fruits, vegetables, whole grains, and fat-free or low-fat milk products in your daily diet. Talk to your doctor or dietician about the use of coffee, vitamin C, and cherries. These may help lower uric acid levels.  · Keep a healthy body weight.  GET HELP RIGHT AWAY IF:   · You have watery poop (diarrhea), throw up (vomit), or have any side effects from medicines.  · You do not feel better in 24 hours, or you are getting worse.  · Your joint becomes suddenly more tender, and you have chills or a fever.  MAKE SURE YOU:   · Understand these instructions.  · Will watch your condition.  · Will get help right away if you are not doing well or get worse.  Document Released: 09/18/2008 Document Revised: 04/06/2013 Document Reviewed: 03/20/2010  [employer]® Patient Information ©2015 [employer]. This information is not intended to replace advice given to you by your health care  provider. Make sure you discuss any questions you have with your health care provider.

## 2014-12-02 ENCOUNTER — Encounter (HOSPITAL_COMMUNITY): Payer: Self-pay | Admitting: Cardiovascular Disease

## 2015-06-19 ENCOUNTER — Encounter (HOSPITAL_COMMUNITY): Payer: Self-pay | Admitting: Family Medicine

## 2015-06-19 ENCOUNTER — Emergency Department (HOSPITAL_COMMUNITY)
Admission: EM | Admit: 2015-06-19 | Discharge: 2015-06-19 | Disposition: A | Discharge status: Home / Self Care | Payer: Non-veteran care | Attending: Emergency Medicine | Admitting: Emergency Medicine

## 2015-06-19 DIAGNOSIS — M109 Gout, unspecified: Secondary | ICD-10-CM

## 2015-06-19 DIAGNOSIS — Z7982 Long term (current) use of aspirin: Secondary | ICD-10-CM | POA: Insufficient documentation

## 2015-06-19 DIAGNOSIS — M25561 Pain in right knee: Secondary | ICD-10-CM

## 2015-06-19 DIAGNOSIS — M25551 Pain in right hip: Secondary | ICD-10-CM

## 2015-06-19 DIAGNOSIS — M79671 Pain in right foot: Secondary | ICD-10-CM

## 2015-06-19 DIAGNOSIS — I252 Old myocardial infarction: Secondary | ICD-10-CM | POA: Diagnosis not present

## 2015-06-19 DIAGNOSIS — I739 Peripheral vascular disease, unspecified: Secondary | ICD-10-CM | POA: Diagnosis not present

## 2015-06-19 DIAGNOSIS — M1009 Idiopathic gout, multiple sites: Secondary | ICD-10-CM | POA: Insufficient documentation

## 2015-06-19 DIAGNOSIS — Z955 Presence of coronary angioplasty implant and graft: Secondary | ICD-10-CM | POA: Insufficient documentation

## 2015-06-19 DIAGNOSIS — F3181 Bipolar II disorder: Secondary | ICD-10-CM | POA: Diagnosis not present

## 2015-06-19 DIAGNOSIS — Z79899 Other long term (current) drug therapy: Secondary | ICD-10-CM | POA: Insufficient documentation

## 2015-06-19 DIAGNOSIS — I2510 Atherosclerotic heart disease of native coronary artery without angina pectoris: Secondary | ICD-10-CM | POA: Diagnosis not present

## 2015-06-19 DIAGNOSIS — M25521 Pain in right elbow: Secondary | ICD-10-CM

## 2015-06-19 DIAGNOSIS — Z720 Tobacco use: Secondary | ICD-10-CM | POA: Insufficient documentation

## 2015-06-19 MED ORDER — PREDNISONE 20 MG PO TABS
ORAL_TABLET | ORAL | Status: DC
Start: 2015-06-19 — End: 2017-03-26

## 2015-06-19 MED ORDER — PREDNISONE 20 MG PO TABS
60.0000 mg | ORAL_TABLET | Freq: Once | ORAL | Status: AC
  Administered 2015-06-19: 60 mg via ORAL
  Filled 2015-06-19: qty 3

## 2015-06-19 MED ORDER — OXYCODONE-ACETAMINOPHEN 5-325 MG PO TABS: 1.0000 | ORAL_TABLET | Freq: Four times a day (QID) | ORAL | Status: DC | PRN

## 2015-06-19 MED ORDER — OXYCODONE-ACETAMINOPHEN 5-325 MG PO TABS
1.0000 | ORAL_TABLET | Freq: Once | ORAL | Status: AC
  Administered 2015-06-19: 1 via ORAL
  Filled 2015-06-19: qty 1

## 2015-06-19 NOTE — ED Notes (Signed)
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 He states he started developing swelling in joints and increase pain. Pt took 400mg  Ibuprofen 4 hrs with no minimal relief. Two hours ago, took Tramadol 50mg  with no relief.

## 2015-06-19 NOTE — Discharge Instructions (Signed)
Use ice or heat throughout the day to help with pain. Use percocet or ibuprofen as needed for pain. Do not drive or operate machinery with pain medication use. Take prednisone daily as directed, starting tomorrow since you were given your first dose here. Follow up with your regular doctor in 1 week for recheck. Return to the ER for changes or worsening symptoms.    Gout Gout is when your joints become red, sore, and swell (inflamed). This is caused by the buildup of uric acid crystals in the joints. Uric acid is a chemical that is normally in the blood. If the level of uric acid gets too high in the blood, these crystals form in your joints and tissues. Over time, these crystals can form into masses near the joints and tissues. These masses can destroy bone and cause the bone to look misshapen (deformed). HOME CARE   Do not take aspirin for pain.  Only take medicine as told by your doctor.  Rest the joint as much as you can. When in bed, keep sheets and blankets off painful areas.  Keep the sore joints raised (elevated).  Put warm or cold packs on painful joints. Use of warm or cold packs depends on which works best for you.  Use crutches if the painful joint is in your leg.  Drink enough fluids to keep your pee (urine) clear or pale yellow. Limit alcohol, sugary drinks, and drinks with fructose in them.  Follow your diet instructions. Pay careful attention to how much protein you eat. Include fruits, vegetables, whole grains, and fat-free or low-fat milk products in your daily diet. Talk to your doctor or dietitian about the use of coffee, vitamin C, and cherries. These may help lower uric acid levels.  Keep a healthy body weight. GET HELP RIGHT AWAY IF:   You have watery poop (diarrhea), throw up (vomit), or have any side effects from medicines.  You do not feel better in 24 hours, or you are getting worse.  Your joint becomes suddenly more tender, and you have chills or a  fever. MAKE SURE YOU:   Understand these instructions.  Will watch your condition.  Will get help right away if you are not doing well or get worse. Document Released: 09/18/2008 Document Revised: 04/26/2014 Document Reviewed: 07/23/2012 [facility] Patient Information 2015 [place], [place]. This information is not intended to replace advice given to you by your health care provider. Make sure you discuss any questions you have with your health care provider.  Heat Therapy Heat therapy can help ease sore, stiff, injured, and tight muscles and joints. Heat relaxes your muscles, which may help ease your pain.  RISKS AND COMPLICATIONS If you have any of the following conditions, do not use heat therapy unless your health care provider has approved:  Poor circulation.  Healing wounds or scarred skin in the area being treated.  Diabetes, heart disease, or high blood pressure.  Not being able to feel (numbness) the area being treated.  Unusual swelling of the area being treated.  Active infections.  Blood clots.  Cancer.  Inability to communicate pain. This may include young children and people who have problems with their brain function (dementia).  Pregnancy. Heat therapy should only be used on old, pre-existing, or long-lasting (chronic) injuries. Do not use heat therapy on new injuries unless directed by your health care provider. HOW TO USE HEAT THERAPY There are several different kinds of heat therapy, including:  Moist heat pack.  Warm water  bath.  Hot water bottle.  Electric heating pad.  Heated gel pack.  Heated wrap.  Electric heating pad. Use the heat therapy method suggested by your health care provider. Follow your health care provider's instructions on when and how to use heat therapy. GENERAL HEAT THERAPY RECOMMENDATIONS  Do not sleep while using heat therapy. Only use heat therapy while you are awake.  Your skin may turn pink while using heat therapy. Do  not use heat therapy if your skin turns red.  Do not use heat therapy if you have new pain.  High heat or long exposure to heat can cause burns. Be careful when using heat therapy to avoid burning your skin.  Do not use heat therapy on areas of your skin that are already irritated, such as with a rash or sunburn. SEEK MEDICAL CARE IF:  You have blisters, redness, swelling, or numbness.  You have new pain.  Your pain is worse. MAKE SURE YOU:  Understand these instructions.  Will watch your condition.  Will get help right away if you are not doing well or get worse. Document Released: 03/03/2012 Document Revised: 04/26/2014 Document Reviewed: 02/02/2014 [facility] Patient Information 2015 [place], [place]. This information is not intended to replace advice given to you by your health care provider. Make sure you discuss any questions you have with your health care provider.  Low-Purine Diet Purines are compounds that affect the level of uric acid in your body. A low-purine diet is a diet that is low in purines. Eating a low-purine diet can prevent the level of uric acid in your body from getting too high and causing gout or kidney stones or both. WHAT DO I NEED TO KNOW ABOUT THIS DIET?  Choose low-purine foods. Examples of low-purine foods are listed in the next section.  Drink plenty of fluids, especially water. Fluids can help remove uric acid from your body. Try to drink 8-16 cups (1.9-3.8 L) a day.  Limit foods high in fat, especially saturated fat, as fat makes it harder for the body to get rid of uric acid. Foods high in saturated fat include pizza, cheese, ice cream, whole milk, fried foods, and gravies. Choose foods that are lower in fat and lean sources of protein. Use olive oil when cooking as it contains healthy fats that are not high in saturated fat.  Limit alcohol. Alcohol interferes with the elimination of uric acid from your body. If you are having a gout attack, avoid  all alcohol.  Keep in mind that different people's bodies react differently to different foods. You will probably learn over time which foods do or do not affect you. If you discover that a food tends to cause your gout to flare up, avoid eating that food. You can more freely enjoy foods that do not cause problems. If you have any questions about a food item, talk to your dietitian or health care provider. WHICH FOODS ARE LOW, MODERATE, AND HIGH IN PURINES? The following is a list of foods that are low, moderate, and high in purines. You can eat any amount of the foods that are low in purines. You may be able to have small amounts of foods that are moderate in purines. Ask your health care provider how much of a food moderate in purines you can have. Avoid foods high in purines. Grains  Foods low in purines: Enriched white bread, pasta, rice, cake, cornbread, popcorn.  Foods moderate in purines: Whole-grain breads and cereals, wheat germ, bran,  oatmeal. Uncooked oatmeal. Dry wheat bran or wheat germ.  Foods high in purines: Pancakes, [place] toast, biscuits, muffins. Vegetables  Foods low in purines: All vegetables, except those that are moderate in purines.  Foods moderate in purines: Asparagus, cauliflower, spinach, mushrooms, green peas. Fruits  All fruits are low in purines. Meats and other Protein Foods  Foods low in purines: Eggs, nuts, peanut butter.  Foods moderate in purines: 80-90% lean beef, lamb, veal, pork, poultry, fish, eggs, peanut butter, nuts. Crab, lobster, oysters, and shrimp. Cooked dried beans, peas, and lentils.  Foods high in purines: Anchovies, sardines, herring, mussels, tuna, codfish, scallops, trout, and haddock. Tomasa Blase. Organ meats (such as liver or kidney). Tripe. Game meat. Goose. Sweetbreads. Dairy  All dairy foods are low in purines. Low-fat and fat-free dairy products are best because they are low in saturated fat. Beverages  Drinks low in purines:  Water, carbonated beverages, tea, coffee, cocoa.  Drinks moderate in purines: Soft drinks and other drinks sweetened with high-fructose corn syrup. Juices. To find whether a food or drink is sweetened with high-fructose corn syrup, look at the ingredients list.  Drinks high in purines: Alcoholic beverages (such as beer). Condiments  Foods low in purines: Salt, herbs, olives, pickles, relishes, vinegar.  Foods moderate in purines: Butter, margarine, oils, mayonnaise. Fats and Oils  Foods low in purines: All types, except gravies and sauces made with meat.  Foods high in purines: Gravies and sauces made with meat. Other Foods  Foods low in purines: Sugars, sweets, gelatin. Cake. Soups made without meat.  Foods moderate in purines: Meat-based or fish-based soups, broths, or bouillons. Foods and drinks sweetened with high-fructose corn syrup.  Foods high in purines: High-fat desserts (such as ice cream, cookies, cakes, pies, doughnuts, and chocolate). Contact your dietitian for more information on foods that are not listed here. Document Released: 04/06/2011 Document Revised: 12/15/2013 Document Reviewed: 11/16/2013 [facility] Patient Information 2015 [place], [place]. This information is not intended to replace advice given to you by your health care provider. Make sure you discuss any questions you have with your health care provider.

## 2015-06-19 NOTE — ED Provider Notes (Signed)
CSN: [id number]     Arrival date & time 06/19/15  1915 History   First MD Initiated Contact with Patient 06/19/15 2054     Chief Complaint  Patient presents with  . Gout     (Consider location/radiation/quality/duration/timing/severity/associated sxs/prior Treatment) HPI Comments: [name] is a 59 y.o. male with a PMHx of bipolar 2, gout, CAD, MI s/p stent in RCA, and PAD, who presents to the ED with complaints of right knee, foot, hip, and elbow pain 2 days which feels similar to his prior gout episodes. He states the pain is 8/10 intermittent throbbing which is nonradiating from these locations, worse with movement or activity, and relieved with 400 mg ibuprofen but only temporary relief from these. He takes allopurinol 400 mg daily and with flareups he typically does well with prednisone for no more than 10 days. He states 2 days ago he thinks he had a hamburger from [employer] which could have set his flareups off. He reports some erythema to the foot and knee, and some swelling to the knee. Overall he feels this is exactly the same as his gout flareups. Denies any fevers, chills, chest pain, shortness breath, abdominal pain, nausea, vomiting, diarrhea, constipation, melena, hematochezia, dysuria, hematuria, penile discharge, eye pain or redness, numbness, tingling, or weakness.  Patient is a 59 y.o. male presenting with knee pain. The history is provided by the patient. No language interpreter was used.  Knee Pain Location:  Knee and foot Time since incident:  2 days Injury: no   Knee location:  R knee Foot location:  R foot Pain details:    Quality:  Throbbing   Radiates to:  Does not radiate   Severity:  Moderate   Onset quality:  Gradual   Duration:  2 days   Timing:  Intermittent   Progression:  Unchanged Chronicity:  Recurrent Prior injury to area:  No Relieved by:  NSAIDs Worsened by:  Activity Ineffective treatments:  None tried Associated symptoms: swelling    Associated symptoms: no back pain, no decreased ROM, no fever, no muscle weakness, no numbness, no stiffness and no tingling   Risk factors: known bone disorder     Past Medical History  Diagnosis Date  . Bipolar 2 disorder   . Gout   . Coronary artery disease   . MI (myocardial infarction)   . Acute myocardial infarction of other inferior wall, initial episode of care   . PAD (peripheral artery disease)     Moderate LE atherosclerosis.  . S/P right coronary artery (RCA) stent placement 03/04/2012   Past Surgical History  Procedure Laterality Date  . Coronary stent placement    . Cholecystectomy    . Coronary angioplasty    . Coronary artery bypass graft    . Femoral-popliteal bypass graft    . Toe surgery    . Left heart catheterization with coronary angiogram N/A 03/04/2012    Procedure: LEFT HEART CATHETERIZATION WITH CORONARY ANGIOGRAM;  Surgeon: Runell Gess, MD;  Location: [facility] CATH LAB;  Service: Cardiovascular;  Laterality: N/A;  . Left heart catheterization with coronary angiogram N/A 10/07/2013    Procedure: LEFT HEART CATHETERIZATION WITH CORONARY ANGIOGRAM;  Surgeon: Corky Crafts, MD;  Location: [facility] CATH LAB;  Service: Cardiovascular;  Laterality: N/A;   History reviewed. No pertinent family history. History  Substance Use Topics  . Smoking status: Current Every Day Smoker -- 1.00 packs/day    Types: Cigarettes  . Smokeless tobacco: Not on file  .  Alcohol Use: No     Comment: former drinker    Review of Systems  Constitutional: Negative for fever and chills.  Eyes: Negative for pain and redness.  Respiratory: Negative for shortness of breath.   Cardiovascular: Negative for chest pain and leg swelling.  Gastrointestinal: Negative for nausea, vomiting, abdominal pain, diarrhea and constipation.  Genitourinary: Negative for dysuria, hematuria and discharge.  Musculoskeletal: Positive for joint swelling (R knee) and arthralgias (R foot, knee, hip, and  elbow). Negative for myalgias, back pain and stiffness.  Skin: Positive for color change (R knee and foot).  Allergic/Immunologic: Negative for immunocompromised state.  Neurological: Negative for weakness and numbness.  Psychiatric/Behavioral: Negative for confusion.   10 Systems reviewed and are negative for acute change except as noted in the HPI.    Allergies  Prednisone  Home Medications   Prior to Admission medications   Medication Sig Start Date End Date Taking? Authorizing Provider  allopurinol (ZYLOPRIM) 100 MG tablet Take 400 mg by mouth daily.   Yes Historical Provider, MD  aspirin EC 81 MG tablet Take 81 mg by mouth daily.   Yes Historical Provider, MD  atorvastatin (LIPITOR) 80 MG tablet Take 80 mg by mouth at bedtime.    Yes Historical Provider, MD  cyclobenzaprine (FLEXERIL) 10 MG tablet Take 10 mg by mouth 3 (three) times daily as needed for muscle spasms (muscle spasms).   Yes Historical Provider, MD  divalproex (DEPAKOTE) 500 MG DR tablet Take 1,000 mg by mouth at bedtime.    Yes Historical Provider, MD  fenofibrate (TRICOR) 145 MG tablet Take 145 mg by mouth at bedtime.   Yes Historical Provider, MD  ibuprofen (ADVIL,MOTRIN) 200 MG tablet Take 400 mg by mouth every 6 (six) hours as needed for moderate pain (pain).   Yes Historical Provider, MD  metoprolol (LOPRESSOR) 100 MG tablet Take 50 mg by mouth daily.   Yes Historical Provider, MD  Omega-3 Fatty Acids (FISH OIL) 1000 MG CAPS Take 2,000 mg by mouth 2 (two) times daily.   Yes Historical Provider, MD  omeprazole (PRILOSEC) 20 MG capsule Take 40 mg by mouth 2 (two) times daily.     Yes Historical Provider, MD  traMADol (ULTRAM) 50 MG tablet Take 100 mg by mouth every 6 (six) hours as needed for moderate pain or severe pain (pain). For pain. Maximum dose= 8 tablets per day   Yes Historical Provider, MD  acetaminophen (TYLENOL) 325 MG tablet Take 2 tablets (650 mg total) by mouth every 4 (four) hours as needed for pain  or fever. Patient not taking: Reported on 06/19/2015 10/09/13   Abelino Derrick, PA-C  albuterol (PROVENTIL HFA;VENTOLIN HFA) 108 (90 BASE) MCG/ACT inhaler Inhale 2 puffs into the lungs every 6 (six) hours as needed. WHEEZING    Historical Provider, MD  metoprolol tartrate (LOPRESSOR) 12.5 mg TABS tablet Take 0.5 tablets (12.5 mg total) by mouth 2 (two) times daily. Patient not taking: Reported on 06/19/2015 10/09/13   Abelino Derrick, PA-C  nitroGLYCERIN (NITROSTAT) 0.4 MG SL tablet Place 0.4 mg under the tongue every 5 (five) minutes as needed for chest pain. Repeat every 5 minutes up to 3 doses.  If no relief seek medical help (Do not take Cialis, Viagra or Levitra while you are on this medication)    Historical Provider, MD  oxyCODONE-acetaminophen (PERCOCET/ROXICET) 5-325 MG per tablet Take 1-2 tablets by mouth every 4 (four) hours as needed for severe pain. Patient not taking: Reported on 06/19/2015 06/29/14  Elpidio Anis, PA-C  predniSONE (DELTASONE) 10 MG tablet Take 6 tabs day 1 Take 5 tabs day 2 Take 4 tabs day 3 Take 3 tabs day 4 Take 2 tabs day 5  and take 1 tab on day 6 Patient not taking: Reported on 06/19/2015 06/29/14   Elpidio Anis, PA-C  Ticagrelor (BRILINTA) 90 MG TABS tablet Take 1 tablet (90 mg total) by mouth 2 (two) times daily. Patient not taking: Reported on 06/19/2015 10/09/13   Eda Paschal Kilroy, PA-C   BP 121/72 mmHg  Pulse 96  Temp(Src) 98.3 F (36.8 C) (Oral)  Resp 20  Ht 5\' 9"  (1.753 m)  Wt 198 lb (89.812 kg)  BMI 29.23 kg/m2  SpO2 98% Physical Exam  Constitutional: He is oriented to person, place, and time. Vital signs are normal. He appears well-developed and well-nourished.  Non-toxic appearance. No distress.  Afebrile, nontoxic, NAD  HENT:  Head: Normocephalic and atraumatic.  Mouth/Throat: Oropharynx is clear and moist and mucous membranes are normal.  Eyes: Conjunctivae and EOM are normal. Right eye exhibits no discharge. Left eye exhibits no discharge.  Neck:  Normal range of motion. Neck supple.  Cardiovascular: Normal rate, regular rhythm, normal heart sounds and intact distal pulses.  Exam reveals no gallop and no friction rub.   No murmur heard. Pulmonary/Chest: Effort normal and breath sounds normal. No respiratory distress. He has no decreased breath sounds. He has no wheezes. He has no rhonchi. He has no rales.  Abdominal: Soft. Normal appearance and bowel sounds are normal. He exhibits no distension. There is no tenderness. There is no rigidity, no rebound, no guarding, no CVA tenderness, no tenderness at McBurney's point and negative Murphy's sign.  Musculoskeletal:       Right elbow: He exhibits decreased range of motion (mildly, due to pain). He exhibits no swelling and no effusion. Tenderness found.       Right hip: He exhibits tenderness. He exhibits normal range of motion, normal strength, no swelling and no deformity.       Right knee: He exhibits decreased range of motion (mild, due to pain), swelling, effusion and erythema (trace, laterally). He exhibits normal alignment, no LCL laxity, normal patellar mobility and no MCL laxity. Tenderness found. Lateral joint line tenderness noted.       Right foot: There is tenderness. There is normal range of motion, no swelling and normal capillary refill.  R elbow with mildly limited ROM due to pain, no swelling or erythema, trace warmth, with no effusion but mild TTP along lateral epicondyle.  R hip with mild TTP posteriorly, FROM intact with no crepitus or deformity, no erythema or warmth. R knee with mildly limited ROM due to pain, +lateral joint line TTP with trace erythema and warmth, small effusion noted, no deformity, no bruising, no abnormal alignment or patellar mobility, no varus/valgus laxity, neg anterior drawer test, no crepitus.  R foot with mild erythema and warmth over metatarsals, mildly TTP, wiggles all digits with ease, cap refill brisk and present Strength and sensation grossly  intact in all extremities Distal pulses intact Soft compartments  Neurological: He is alert and oriented to person, place, and time. He has normal strength. No sensory deficit.  Skin: Skin is warm, dry and intact. No bruising and no rash noted. There is erythema.  Mild erythema to R knee and foot, as noted above  Psychiatric: He has a normal mood and affect.  Nursing note and vitals reviewed.   ED Course  Procedures (including  critical care time) Labs Review Labs Reviewed - No data to display  Imaging Review No results found.   EKG Interpretation None      MDM   Final diagnoses:  Acute gout of multiple sites, unspecified cause  Foot pain, right  Right knee pain  Right elbow pain  Right hip pain    59 y.o. male here with acute gout flare. Extremities neurovascularly intact with soft compartments. Mild erythema and warmth over R foot and lateral aspect of R knee, hip and elbow with no erythema or warmth. Mildly limited ROM at knee and elbow due to pain, but pt afebrile and does not appear to have septic joints. States this feels like his gout flares. Does well with prednisone short course. Will also give percocet. Discussed elevation and ice/heat, and have him f/up with PCP in 1wk. I explained the diagnosis and have given explicit precautions to return to the ER including for any other new or worsening symptoms. The patient understands and accepts the medical plan as it's been dictated and I have answered their questions. Discharge instructions concerning home care and prescriptions have been given. The patient is STABLE and is discharged to home in good condition.   BP 121/72 mmHg  Pulse 96  Temp(Src) 98.3 F (36.8 C) (Oral)  Resp 20  Ht 5\' 9"  (1.753 m)  Wt 198 lb (89.812 kg)  BMI 29.23 kg/m2  SpO2 98%  Meds ordered this encounter  Medications  . predniSONE (DELTASONE) tablet 60 mg    Sig:   . oxyCODONE-acetaminophen (PERCOCET/ROXICET) 5-325 MG per tablet 1 tablet     Sig:   . predniSONE (DELTASONE) 20 MG tablet    Sig: 3 tabs po daily x 4 days    Dispense:  12 tablet    Refill:  0    Order Specific Question:  Supervising Provider    Answer:  MILLER, BRIAN [[id number]]  . oxyCODONE-acetaminophen (PERCOCET) 5-325 MG per tablet    Sig: Take 1 tablet by mouth every 6 (six) hours as needed for severe pain.    Dispense:  10 tablet    Refill:  0    Order Specific Question:  Supervising Provider    Answer:  Eber Hong [[id number]]     Stanley Kerner Camprubi-Soms, PA-C 06/19/15 2140  Purvis Sheffield, MD 06/21/15 [phone]

## 2017-03-26 ENCOUNTER — Encounter (HOSPITAL_COMMUNITY): Payer: Self-pay | Admitting: Emergency Medicine

## 2017-03-26 ENCOUNTER — Inpatient Hospital Stay (HOSPITAL_COMMUNITY)
Admission: EM | Admit: 2017-03-26 | Discharge: 2017-03-28 | DRG: 683 | Disposition: A | Discharge status: Home / Self Care | Payer: Non-veteran care | Attending: Internal Medicine | Admitting: Internal Medicine

## 2017-03-26 DIAGNOSIS — K219 Gastro-esophageal reflux disease without esophagitis: Secondary | ICD-10-CM | POA: Diagnosis present

## 2017-03-26 DIAGNOSIS — F17210 Nicotine dependence, cigarettes, uncomplicated: Secondary | ICD-10-CM | POA: Diagnosis present

## 2017-03-26 DIAGNOSIS — R079 Chest pain, unspecified: Secondary | ICD-10-CM

## 2017-03-26 DIAGNOSIS — I5022 Chronic systolic (congestive) heart failure: Secondary | ICD-10-CM

## 2017-03-26 DIAGNOSIS — Z8249 Family history of ischemic heart disease and other diseases of the circulatory system: Secondary | ICD-10-CM

## 2017-03-26 DIAGNOSIS — Z951 Presence of aortocoronary bypass graft: Secondary | ICD-10-CM

## 2017-03-26 DIAGNOSIS — N179 Acute kidney failure, unspecified: Secondary | ICD-10-CM | POA: Diagnosis not present

## 2017-03-26 DIAGNOSIS — Z888 Allergy status to other drugs, medicaments and biological substances status: Secondary | ICD-10-CM

## 2017-03-26 DIAGNOSIS — I252 Old myocardial infarction: Secondary | ICD-10-CM

## 2017-03-26 DIAGNOSIS — I2510 Atherosclerotic heart disease of native coronary artery without angina pectoris: Secondary | ICD-10-CM | POA: Diagnosis present

## 2017-03-26 DIAGNOSIS — E781 Pure hyperglyceridemia: Secondary | ICD-10-CM | POA: Diagnosis present

## 2017-03-26 DIAGNOSIS — F17200 Nicotine dependence, unspecified, uncomplicated: Secondary | ICD-10-CM | POA: Diagnosis present

## 2017-03-26 DIAGNOSIS — I951 Orthostatic hypotension: Secondary | ICD-10-CM | POA: Diagnosis present

## 2017-03-26 DIAGNOSIS — I739 Peripheral vascular disease, unspecified: Secondary | ICD-10-CM | POA: Diagnosis present

## 2017-03-26 DIAGNOSIS — I255 Ischemic cardiomyopathy: Secondary | ICD-10-CM | POA: Diagnosis present

## 2017-03-26 DIAGNOSIS — Z79899 Other long term (current) drug therapy: Secondary | ICD-10-CM

## 2017-03-26 DIAGNOSIS — E860 Dehydration: Secondary | ICD-10-CM | POA: Diagnosis present

## 2017-03-26 DIAGNOSIS — F3181 Bipolar II disorder: Secondary | ICD-10-CM | POA: Diagnosis present

## 2017-03-26 DIAGNOSIS — G8929 Other chronic pain: Secondary | ICD-10-CM | POA: Diagnosis present

## 2017-03-26 DIAGNOSIS — Z7982 Long term (current) use of aspirin: Secondary | ICD-10-CM

## 2017-03-26 DIAGNOSIS — Z7902 Long term (current) use of antithrombotics/antiplatelets: Secondary | ICD-10-CM

## 2017-03-26 DIAGNOSIS — F319 Bipolar disorder, unspecified: Secondary | ICD-10-CM | POA: Diagnosis present

## 2017-03-26 DIAGNOSIS — Z9049 Acquired absence of other specified parts of digestive tract: Secondary | ICD-10-CM

## 2017-03-26 DIAGNOSIS — M109 Gout, unspecified: Secondary | ICD-10-CM | POA: Diagnosis present

## 2017-03-26 DIAGNOSIS — Z955 Presence of coronary angioplasty implant and graft: Secondary | ICD-10-CM

## 2017-03-26 LAB — BASIC METABOLIC PANEL
Anion gap: 9 (ref 5–15)
BUN: 18 mg/dL (ref 6–20)
CO2: 24 mmol/L (ref 22–32)
Calcium: 9.3 mg/dL (ref 8.9–10.3)
Chloride: 103 mmol/L (ref 101–111)
Creatinine, Ser: 2.4 mg/dL — ABNORMAL HIGH (ref 0.61–1.24)
GFR calc Af Amer: 32 mL/min — ABNORMAL LOW (ref >60)
GFR, EST NON AFRICAN AMERICAN: 27 mL/min — AB (ref >60)
GLUCOSE: 113 mg/dL — AB (ref 65–99)
POTASSIUM: 3.9 mmol/L (ref 3.5–5.1)
Sodium: 136 mmol/L (ref 135–145)

## 2017-03-26 LAB — CBC
HEMATOCRIT: 35.7 % — AB (ref 39.0–52.0)
Hemoglobin: 12.3 g/dL — ABNORMAL LOW (ref 13.0–17.0)
MCH: 30.6 pg (ref 26.0–34.0)
MCHC: 34.5 g/dL (ref 30.0–36.0)
MCV: 88.8 fL (ref 78.0–100.0)
Platelets: 197 10*3/uL (ref 150–400)
RBC: 4.02 MIL/uL — ABNORMAL LOW (ref 4.22–5.81)
RDW: 14 % (ref 11.5–15.5)
WBC: 8.3 10*3/uL (ref 4.0–10.5)

## 2017-03-26 LAB — URINALYSIS, ROUTINE W REFLEX MICROSCOPIC
BILIRUBIN URINE: NEGATIVE
GLUCOSE, UA: NEGATIVE mg/dL
HGB URINE DIPSTICK: NEGATIVE
KETONES UR: NEGATIVE mg/dL
Leukocytes, UA: NEGATIVE
NITRITE: NEGATIVE
PH: 5 (ref 5.0–8.0)
Protein, ur: NEGATIVE mg/dL
Specific Gravity, Urine: 1.01 (ref 1.005–1.030)

## 2017-03-26 LAB — RAPID URINE DRUG SCREEN, HOSP PERFORMED
AMPHETAMINES: NOT DETECTED
Barbiturates: NOT DETECTED
Benzodiazepines: POSITIVE — AB
Cocaine: NOT DETECTED
Opiates: NOT DETECTED
TETRAHYDROCANNABINOL: NOT DETECTED

## 2017-03-26 LAB — VALPROIC ACID LEVEL

## 2017-03-26 LAB — CBG MONITORING, ED: Glucose-Capillary: 125 mg/dL — ABNORMAL HIGH (ref 65–99)

## 2017-03-26 MED ORDER — SODIUM CHLORIDE 0.9 % IV BOLUS (SEPSIS)
1000.0000 mL | Freq: Once | INTRAVENOUS | Status: AC
  Administered 2017-03-26: 1000 mL via INTRAVENOUS

## 2017-03-26 MED ORDER — SODIUM CHLORIDE 0.9 % IV SOLN
INTRAVENOUS | Status: DC
  Administered 2017-03-26: 22:00:00 via INTRAVENOUS

## 2017-03-26 NOTE — ED Triage Notes (Signed)
Pt comes in with complaints of dizziness with new onset of today.  Orthostatic changes noted in blood pressure.  Pt states he has been drinking enough but has had "quite a few sodas" today.  Hx of multiple MI's.  A&O x4. Endorses some nausea.  Denies chest pain or any other symptoms at this time.  Does have right shoulder pain due to rotator cuff.

## 2017-03-26 NOTE — ED Notes (Signed)
Standing BP 82/62

## 2017-03-26 NOTE — ED Provider Notes (Signed)
[facility]-EMERGENCY DEPT Provider Note   CSN: [id number] Arrival date & time: 03/26/17  2044     History   Chief Complaint Chief Complaint  Patient presents with  . Dizziness  . Hypotension    HPI [name] is a 61 y.o. male.  61 year old male presents with acute onset of dizziness about 5 PM today. States with standing up became lightheaded. His blood pressure at home at systolic was 70. Denies any recent fever, vomiting, diarrhea. No GI bleeding history. No chest pain or shortness of breath. No severe headache. No back pain. No recent changes to his medications. Symptoms are worse when he stands and better with remaining flat. No prior history of same.      Past Medical History:  Diagnosis Date  . Acute myocardial infarction of other inferior wall, initial episode of care   . Bipolar 2 disorder (HCC)   . Coronary artery disease   . Gout   . MI (myocardial infarction)   . PAD (peripheral artery disease) (HCC)    Moderate LE atherosclerosis.  . S/P right coronary artery (RCA) stent placement 03/04/2012    Patient Active Problem List   Diagnosis Date Noted  . S/P angioplasty with stent SVG-RCA 3/13 (left AMA without Plavix), and again SVG-RCA BMS 10/09/13 10/10/2013  . Hypertriglyceridemia 10/09/2013  . Acute myocardial infarction of other inferior wall, initial episode of care   . STEMI (ST elevation myocardial infarction) (HCC) 03/04/2012  . CAD, CABG X 27 March 2008 03/04/2012  . Bipolar 1 disorder (HCC) 03/04/2012  . Homeless 03/04/2012  . Noncompliance with diet and medication regimen 03/04/2012  . Smoker 03/04/2012  . PVD, Lt FPBPG at [facility] Oct 2012 03/04/2012  . Ischemic cardiomyopathy, EF 40% 2009 03/04/2012  . CAD - Inf STEMI in 2013 & now in 09/2013 03/04/2012  . HEPATITIS C 02/20/2007  . HYPERLIPIDEMIA 02/20/2007  . GOUT, UNSPECIFIED 02/20/2007  . TOBACCO DEPENDENCE 02/20/2007  . HERNIA, HIATAL, NONCONGENITAL 02/20/2007  . GASTROINTESTINAL HEMORRHAGE  02/20/2007  . ARTHRITIS 02/20/2007    Past Surgical History:  Procedure Laterality Date  . CHOLECYSTECTOMY    . CORONARY ANGIOPLASTY    . CORONARY ARTERY BYPASS GRAFT    . CORONARY STENT PLACEMENT    . FEMORAL-POPLITEAL BYPASS GRAFT    . LEFT HEART CATHETERIZATION WITH CORONARY ANGIOGRAM N/A 03/04/2012   Procedure: LEFT HEART CATHETERIZATION WITH CORONARY ANGIOGRAM;  Surgeon: Runell Gess, MD;  Location: [facility] C[facility] LAB;  Service: Cardiovascular;  Laterality: N/A;  . LEFT HEART CATHETERIZATION WITH CORONARY ANGIOGRAM N/A 10/07/2013   Procedure: LEFT HEART CATHETERIZATION WITH CORONARY ANGIOGRAM;  Surgeon: Corky Crafts, MD;  Location: [facility] CATH LAB;  Service: Cardiovascular;  Laterality: N/A;  . TOE SURGERY         Home Medications    Prior to Admission medications   Medication Sig Start Date End Date Taking? Authorizing Provider  albuterol (PROVENTIL HFA;VENTOLIN HFA) 108 (90 BASE) MCG/ACT inhaler Inhale 2 puffs into the lungs every 6 (six) hours as needed. WHEEZING   Yes Historical Provider, MD  allopurinol (ZYLOPRIM) 100 MG tablet Take 450 mg by mouth daily.    Yes Historical Provider, MD  aspirin EC 81 MG tablet Take 81 mg by mouth daily.   Yes Historical Provider, MD  atorvastatin (LIPITOR) 80 MG tablet Take 80 mg by mouth at bedtime.    Yes Historical Provider, MD  Calcium Citrate-Vitamin D (CALCIUM + D PO) Take 1 tablet by mouth daily.   Yes Historical  Provider, MD  cyclobenzaprine (FLEXERIL) 10 MG tablet Take 10 mg by mouth 3 (three) times daily as needed for muscle spasms (muscle spasms).   Yes Historical Provider, MD  divalproex (DEPAKOTE) 500 MG DR tablet Take 1,000 mg by mouth at bedtime.    Yes Historical Provider, MD  fenofibrate (TRICOR) 145 MG tablet Take 145 mg by mouth daily.    Yes Historical Provider, MD  losartan (COZAAR) 50 MG tablet Take 50 mg by mouth daily.   Yes Historical Provider, MD  metoprolol (LOPRESSOR) 100 MG tablet Take 50 mg by mouth daily.    Yes Historical Provider, MD  nitroGLYCERIN (NITROSTAT) 0.4 MG SL tablet Place 0.4 mg under the tongue every 5 (five) minutes as needed for chest pain. Repeat every 5 minutes up to 3 doses.  If no relief seek medical help (Do not take Cialis, Viagra or Levitra while you are on this medication)   Yes Historical Provider, MD  Omega-3 Fatty Acids (FISH OIL) 1000 MG CAPS Take 2,000 mg by mouth 2 (two) times daily.   Yes Historical Provider, MD  oxyCODONE (OXY IR/ROXICODONE) 5 MG immediate release tablet Take 5 mg by mouth every 6 (six) hours as needed for severe pain.   Yes Historical Provider, MD  pantoprazole (PROTONIX) 40 MG tablet Take 40 mg by mouth 2 (two) times daily.   Yes Historical Provider, MD  metoprolol tartrate (LOPRESSOR) 12.5 mg TABS tablet Take 0.5 tablets (12.5 mg total) by mouth 2 (two) times daily. Patient not taking: Reported on 06/19/2015 10/09/13   Abelino Derrick, PA-C  Ticagrelor (BRILINTA) 90 MG TABS tablet Take 1 tablet (90 mg total) by mouth 2 (two) times daily. Patient not taking: Reported on 06/19/2015 10/09/13   Abelino Derrick, PA-C    Family History No family history on file.  Social History Social History  Substance Use Topics  . Smoking status: Current Every Day Smoker    Packs/day: 0.50    Types: Cigarettes  . Smokeless tobacco: Never Used  . Alcohol use No     Comment: former drinker     Allergies   Prednisone and Lisinopril   Review of Systems Review of Systems  All other systems reviewed and are negative.    Physical Exam Updated Vital Signs BP 92/66   Pulse 65   Temp 98 F (36.7 C) (Oral)   Resp 11   Ht  (1.753 m)   Wt 86.2 kg   SpO2 100%   BMI 28.06 kg/m   Physical Exam  Constitutional: He is oriented to person, place, and time. He appears well-developed and well-nourished.  Non-toxic appearance. No distress.  HENT:  Head: Normocephalic and atraumatic.  Eyes: Conjunctivae, EOM and lids are normal. Pupils are equal, round, and  reactive to light.  Neck: Normal range of motion. Neck supple. No tracheal deviation present. No thyroid mass present.  Cardiovascular: Normal rate, regular rhythm and normal heart sounds.  Exam reveals no gallop.   No murmur heard. Pulmonary/Chest: Effort normal and breath sounds normal. No stridor. No respiratory distress. He has no decreased breath sounds. He has no wheezes. He has no rhonchi. He has no rales.  Abdominal: Soft. Normal appearance and bowel sounds are normal. He exhibits no distension. There is no tenderness. There is no rebound and no CVA tenderness.  Musculoskeletal: Normal range of motion. He exhibits no edema or tenderness.  Neurological: He is alert and oriented to person, place, and time. He has normal strength. No cranial  nerve deficit or sensory deficit. GCS eye subscore is 4. GCS verbal subscore is 5. GCS motor subscore is 6.  Skin: Skin is warm and dry. No abrasion and no rash noted.  Psychiatric: He has a normal mood and affect. His speech is normal and behavior is normal.  Nursing note and vitals reviewed.    ED Treatments / Results  Labs (all labs ordered are listed, but only abnormal results are displayed) Labs Reviewed  BASIC METABOLIC PANEL - Abnormal; Notable for the following:       Result Value   Glucose, Bld 113 (*)    Creatinine, Ser 2.40 (*)    GFR calc non Af Amer 27 (*)    GFR calc Af Amer 32 (*)    All other components within normal limits  CBC - Abnormal; Notable for the following:    RBC 4.02 (*)    Hemoglobin 12.3 (*)    HCT 35.7 (*)    All other components within normal limits  CBG MONITORING, ED - Abnormal; Notable for the following:    Glucose-Capillary 125 (*)    All other components within normal limits  URINALYSIS, ROUTINE W REFLEX MICROSCOPIC  VALPROIC ACID LEVEL    EKG  EKG Interpretation  Date/Time:  Tuesday March 26 2017 21:01:23 EDT Ventricular Rate:  68 PR Interval:    QRS Duration: 127 QT Interval:  418 QTC  Calculation: 445 R Axis:   26 Text Interpretation:  Sinus rhythm Nonspecific intraventricular conduction delay Inferior infarct, old Lateral leads are also involved Confirmed by Freida Busman  MD, Evone Arseneau ([id number]) on 03/26/2017 10:42:22 PM       Radiology No results found.  Procedures Procedures (including critical care time)  Medications Ordered in ED Medications  sodium chloride 0.9 % bolus 1,000 mL (not administered)  0.9 %  sodium chloride infusion (not administered)     Initial Impression / Assessment and Plan / ED Course  I have reviewed the triage vital signs and the nursing notes.  Pertinent labs & imaging results that were available during my care of the patient were reviewed by me and considered in my medical decision making (see chart for details).     Patient's hypotension treated with IV fluids here. Has evidence of acute kidney injury on his Bmet. Will be admitted to the hospitalist service  Final Clinical Impressions(s) / ED Diagnoses   Final diagnoses:  None    New Prescriptions New Prescriptions   No medications on file     Lorre Nick, MD 03/26/17 2242

## 2017-03-27 ENCOUNTER — Observation Stay (HOSPITAL_BASED_OUTPATIENT_CLINIC_OR_DEPARTMENT_OTHER): Payer: Non-veteran care

## 2017-03-27 ENCOUNTER — Observation Stay (HOSPITAL_COMMUNITY): Payer: Non-veteran care

## 2017-03-27 ENCOUNTER — Encounter (HOSPITAL_COMMUNITY): Payer: Self-pay | Admitting: Internal Medicine

## 2017-03-27 DIAGNOSIS — Z9049 Acquired absence of other specified parts of digestive tract: Secondary | ICD-10-CM | POA: Diagnosis not present

## 2017-03-27 DIAGNOSIS — N179 Acute kidney failure, unspecified: Secondary | ICD-10-CM

## 2017-03-27 DIAGNOSIS — E781 Pure hyperglyceridemia: Secondary | ICD-10-CM | POA: Diagnosis present

## 2017-03-27 DIAGNOSIS — I9589 Other hypotension: Secondary | ICD-10-CM | POA: Diagnosis not present

## 2017-03-27 DIAGNOSIS — K219 Gastro-esophageal reflux disease without esophagitis: Secondary | ICD-10-CM | POA: Diagnosis present

## 2017-03-27 DIAGNOSIS — I2510 Atherosclerotic heart disease of native coronary artery without angina pectoris: Secondary | ICD-10-CM

## 2017-03-27 DIAGNOSIS — E860 Dehydration: Secondary | ICD-10-CM | POA: Diagnosis present

## 2017-03-27 DIAGNOSIS — R079 Chest pain, unspecified: Secondary | ICD-10-CM | POA: Diagnosis not present

## 2017-03-27 DIAGNOSIS — Z7982 Long term (current) use of aspirin: Secondary | ICD-10-CM | POA: Diagnosis not present

## 2017-03-27 DIAGNOSIS — Z951 Presence of aortocoronary bypass graft: Secondary | ICD-10-CM | POA: Diagnosis not present

## 2017-03-27 DIAGNOSIS — Z888 Allergy status to other drugs, medicaments and biological substances status: Secondary | ICD-10-CM | POA: Diagnosis not present

## 2017-03-27 DIAGNOSIS — I951 Orthostatic hypotension: Secondary | ICD-10-CM

## 2017-03-27 DIAGNOSIS — I739 Peripheral vascular disease, unspecified: Secondary | ICD-10-CM | POA: Diagnosis present

## 2017-03-27 DIAGNOSIS — Z955 Presence of coronary angioplasty implant and graft: Secondary | ICD-10-CM | POA: Diagnosis not present

## 2017-03-27 DIAGNOSIS — I252 Old myocardial infarction: Secondary | ICD-10-CM | POA: Diagnosis not present

## 2017-03-27 DIAGNOSIS — Z79899 Other long term (current) drug therapy: Secondary | ICD-10-CM | POA: Diagnosis not present

## 2017-03-27 DIAGNOSIS — G8929 Other chronic pain: Secondary | ICD-10-CM | POA: Diagnosis present

## 2017-03-27 DIAGNOSIS — Z8249 Family history of ischemic heart disease and other diseases of the circulatory system: Secondary | ICD-10-CM | POA: Diagnosis not present

## 2017-03-27 DIAGNOSIS — I5022 Chronic systolic (congestive) heart failure: Secondary | ICD-10-CM | POA: Diagnosis present

## 2017-03-27 DIAGNOSIS — F3181 Bipolar II disorder: Secondary | ICD-10-CM | POA: Diagnosis present

## 2017-03-27 DIAGNOSIS — M109 Gout, unspecified: Secondary | ICD-10-CM | POA: Diagnosis present

## 2017-03-27 DIAGNOSIS — I255 Ischemic cardiomyopathy: Secondary | ICD-10-CM | POA: Diagnosis present

## 2017-03-27 DIAGNOSIS — F17210 Nicotine dependence, cigarettes, uncomplicated: Secondary | ICD-10-CM | POA: Diagnosis present

## 2017-03-27 DIAGNOSIS — Z7902 Long term (current) use of antithrombotics/antiplatelets: Secondary | ICD-10-CM | POA: Diagnosis not present

## 2017-03-27 LAB — BASIC METABOLIC PANEL
ANION GAP: 7 (ref 5–15)
BUN: 19 mg/dL (ref 6–20)
CHLORIDE: 107 mmol/L (ref 101–111)
CO2: 25 mmol/L (ref 22–32)
Calcium: 8.5 mg/dL — ABNORMAL LOW (ref 8.9–10.3)
Creatinine, Ser: 1.53 mg/dL — ABNORMAL HIGH (ref 0.61–1.24)
GFR calc Af Amer: 55 mL/min — ABNORMAL LOW (ref >60)
GFR, EST NON AFRICAN AMERICAN: 47 mL/min — AB (ref >60)
GLUCOSE: 103 mg/dL — AB (ref 65–99)
POTASSIUM: 3.9 mmol/L (ref 3.5–5.1)
Sodium: 139 mmol/L (ref 135–145)

## 2017-03-27 LAB — HEPATIC FUNCTION PANEL
ALBUMIN: 3.8 g/dL (ref 3.5–5.0)
ALT: 19 U/L (ref 17–63)
AST: 16 U/L (ref 15–41)
Alkaline Phosphatase: 46 U/L (ref 38–126)
BILIRUBIN TOTAL: 0.5 mg/dL (ref 0.3–1.2)
Total Protein: 6.2 g/dL — ABNORMAL LOW (ref 6.5–8.1)

## 2017-03-27 LAB — CBC
HEMATOCRIT: 31.9 % — AB (ref 39.0–52.0)
HEMOGLOBIN: 10.8 g/dL — AB (ref 13.0–17.0)
MCH: 30.3 pg (ref 26.0–34.0)
MCHC: 33.9 g/dL (ref 30.0–36.0)
MCV: 89.4 fL (ref 78.0–100.0)
Platelets: 154 10*3/uL (ref 150–400)
RBC: 3.57 MIL/uL — AB (ref 4.22–5.81)
RDW: 14.2 % (ref 11.5–15.5)
WBC: 6.5 10*3/uL (ref 4.0–10.5)

## 2017-03-27 LAB — ECHOCARDIOGRAM COMPLETE
Height: 69 in
WEIGHTICAEL: 3047.64 [oz_av]

## 2017-03-27 LAB — TROPONIN I: Troponin I: <0.03 ng/mL (ref <0.03)

## 2017-03-27 LAB — MRSA PCR SCREENING: MRSA by PCR: NEGATIVE

## 2017-03-27 LAB — TSH: TSH: 4.806 u[IU]/mL — AB (ref 0.350–4.500)

## 2017-03-27 MED ORDER — SODIUM CHLORIDE 0.9 % IV SOLN: INTRAVENOUS | Status: AC

## 2017-03-27 MED ORDER — ONDANSETRON HCL 4 MG/2ML IJ SOLN
4.0000 mg | Freq: Four times a day (QID) | INTRAMUSCULAR | Status: DC | PRN
  Administered 2017-03-28: 4 mg via INTRAVENOUS
  Filled 2017-03-27: qty 2

## 2017-03-27 MED ORDER — SODIUM CHLORIDE 0.9 % IV SOLN
INTRAVENOUS | Status: AC
  Administered 2017-03-27: 01:00:00 via INTRAVENOUS

## 2017-03-27 MED ORDER — PANTOPRAZOLE SODIUM 40 MG PO TBEC
40.0000 mg | DELAYED_RELEASE_TABLET | Freq: Two times a day (BID) | ORAL | Status: DC
  Administered 2017-03-27 – 2017-03-28 (×4): 40 mg via ORAL
  Filled 2017-03-27 (×4): qty 1

## 2017-03-27 MED ORDER — CYCLOBENZAPRINE HCL 10 MG PO TABS
10.0000 mg | ORAL_TABLET | Freq: Three times a day (TID) | ORAL | Status: DC | PRN
  Filled 2017-03-27: qty 1

## 2017-03-27 MED ORDER — FENOFIBRATE 160 MG PO TABS
160.0000 mg | ORAL_TABLET | Freq: Every day | ORAL | Status: DC
  Administered 2017-03-27 – 2017-03-28 (×2): 160 mg via ORAL
  Filled 2017-03-27 (×2): qty 1

## 2017-03-27 MED ORDER — ASPIRIN EC 81 MG PO TBEC
81.0000 mg | DELAYED_RELEASE_TABLET | Freq: Every day | ORAL | Status: DC
  Administered 2017-03-27 – 2017-03-28 (×2): 81 mg via ORAL
  Filled 2017-03-27 (×2): qty 1

## 2017-03-27 MED ORDER — SODIUM CHLORIDE 0.9% FLUSH: 3.0000 mL | Freq: Two times a day (BID) | INTRAVENOUS | Status: DC

## 2017-03-27 MED ORDER — ONDANSETRON HCL 4 MG PO TABS: 4.0000 mg | ORAL_TABLET | Freq: Four times a day (QID) | ORAL | Status: DC | PRN

## 2017-03-27 MED ORDER — DIVALPROEX SODIUM 250 MG PO DR TAB
1000.0000 mg | DELAYED_RELEASE_TABLET | Freq: Every day | ORAL | Status: DC
  Administered 2017-03-27 (×2): 1000 mg via ORAL
  Filled 2017-03-27 (×2): qty 4

## 2017-03-27 MED ORDER — ACETAMINOPHEN 650 MG RE SUPP: 650.0000 mg | Freq: Four times a day (QID) | RECTAL | Status: DC | PRN

## 2017-03-27 MED ORDER — ATORVASTATIN CALCIUM 40 MG PO TABS
80.0000 mg | ORAL_TABLET | Freq: Every day | ORAL | Status: DC
  Administered 2017-03-27 (×2): 80 mg via ORAL
  Filled 2017-03-27 (×2): qty 2

## 2017-03-27 MED ORDER — OXYCODONE HCL 5 MG PO TABS
5.0000 mg | ORAL_TABLET | Freq: Four times a day (QID) | ORAL | Status: DC | PRN
  Administered 2017-03-27 – 2017-03-28 (×7): 5 mg via ORAL
  Filled 2017-03-27 (×7): qty 1

## 2017-03-27 MED ORDER — ACETAMINOPHEN 325 MG PO TABS: 650.0000 mg | ORAL_TABLET | Freq: Four times a day (QID) | ORAL | Status: DC | PRN

## 2017-03-27 MED ORDER — ALLOPURINOL 300 MG PO TABS
450.0000 mg | ORAL_TABLET | Freq: Every day | ORAL | Status: DC
  Administered 2017-03-27 – 2017-03-28 (×2): 450 mg via ORAL
  Filled 2017-03-27 (×2): qty 2

## 2017-03-27 NOTE — Progress Notes (Signed)
  Echocardiogram 2D Echocardiogram has been performed.  Leta Jungling M 03/27/2017, 2:26 PM

## 2017-03-27 NOTE — Progress Notes (Addendum)
Patient seen and examined. Admitted after midnight secondary to dizziness. Found to be positive for orthostatic hypotension and also found to have AKI. Patient symptoms has improved after holding antihypertensive agents and providing IVF's. Patient denies CP, SOB, HA, focal weakness or any other acute complaints. Currently with just mild drop in his BP and with improvement/almost resolution of his AKI. Everything appears to be associated with medications and dehydration. Please refer to H&P written by Dr. Montez Morita for further info/details on admission.  Plan: -continue IVF's, but adjust rate and encourage increase PO hydration -continue holding antihypertensive agents -repeat BMET in am -repeat orthostatic VS in am  [name] [phone]

## 2017-03-27 NOTE — Plan of Care (Signed)
Problem: Safety: Goal: Ability to remain free from injury will improve Outcome: Completed/Met Date Met: 03/27/17 Discussed safety prevention plan. Pt aware. Bed alarm in place

## 2017-03-27 NOTE — H&P (Signed)
History and Physical    [name]:[id number] DOB: 04/11/1956 DOA: 03/26/2017  PCP: This patient is followed at the [facility] in [facility]  Patient coming from: Home  Chief Complaint: Dizziness with sitting up and standing  HPI: [name] is a 61 y.o. gentleman with a history of CAD S/P 5v CABG in 2009, recurrent MI, S/P 2 BMS to bypass graft, PAD with history of femoral-pop bypass, BiPolar disorder, medical noncompliance, and right rotator cuff injury (anticipating surgical intervention soon) who presents to the ED for evaluation of new onset dizziness when going from lying to sitting or sitting to standing.  He has had similar symptoms in the past, but the episodes would only last for several seconds to minutes and would improve without specific intervention.  Today, symptoms persisted for three hours, which prompted him to seek medical attention.  He has also had atypical chest discomfort, which he says does not feel like prior cardiac chest pain.  Discomfort improved with pepto-bismol, and the patient has associated it with GERD.  He has had some SOB.  No associated headache.  No vision disturbance.  No LOC.  He has noticed that he has been sleeping more for the past couple of days.  No cough, cold, or congestion.  No fever.  No nausea, vomiting, or diarrhea.  No blood loss.  He denies any recent medication changes or dose changes.  He denies recreational drug use.  He denies new OTC or herbal supplements.  He denies new exposures.  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  ED Course: Positive orthostatic blood pressures documented in the ED.  Presenting systolic blood pressures as low as 80.  Improved after 1L NS bolus.  Hgb 12.  Creatinine 2.4.  Normal BUN.  Creatinine was normal several years ago.  EKG shows NSR; nonspecific ST changes.  Troponin pending.  Urine does not appear infected.  UDS positive for benzos, which is not consistent with prescribed  medications.  Hospitalist asked to admit for observation of blood pressure and management of AKI.  Review of Systems: As per HPI otherwise 10 systems reviewed and negative.   Past Medical History:  Diagnosis Date  . Acute myocardial infarction of other inferior wall, initial episode of care   . Bipolar 2 disorder (HCC)   . Coronary artery disease   . Gout   . MI (myocardial infarction)   . PAD (peripheral artery disease) (HCC)    Moderate LE atherosclerosis.  . S/P right coronary artery (RCA) stent placement 03/04/2012    Past Surgical History:  Procedure Laterality Date  . CHOLECYSTECTOMY    . CORONARY ANGIOPLASTY    . CORONARY ARTERY BYPASS GRAFT    . CORONARY STENT PLACEMENT    . FEMORAL-POPLITEAL BYPASS GRAFT    . LEFT HEART CATHETERIZATION WITH CORONARY ANGIOGRAM N/A 03/04/2012   Procedure: LEFT HEART CATHETERIZATION WITH CORONARY ANGIOGRAM;  Surgeon: Runell Gess, MD;  Location: [facility] CATH LAB;  Service: Cardiovascular;  Laterality: N/A;  . LEFT HEART CATHETERIZATION WITH CORONARY ANGIOGRAM N/A 10/07/2013   Procedure: LEFT HEART CATHETERIZATION WITH CORONARY ANGIOGRAM;  Surgeon: Corky Crafts, MD;  Location: [facility] CATH LAB;  Service: Cardiovascular;  Laterality: N/A;  . TOE SURGERY       reports that he has been smoking Cigarettes.  He has been smoking about 0.50 packs per day. He has never used smokeless tobacco. He reports that he does not drink alcohol or use drugs.  Retired  from the [employer]. 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  Allergies  Allergen Reactions  . Prednisone Other (See Comments)    Causes steroid rage (takes regularly at home though)  . Lisinopril Cough    Family History  Problem Relation Age of Onset  . Heart disease Mother   . Heart disease Father   His mother also has a Zenker's diverticulum and valvular heart disease. His father has had CABG.   Prior to Admission medications   Medication Sig Start Date End Date  Taking? Authorizing Provider  albuterol (PROVENTIL HFA;VENTOLIN HFA) 108 (90 BASE) MCG/ACT inhaler Inhale 2 puffs into the lungs every 6 (six) hours as needed. WHEEZING   Yes Historical Provider, MD  allopurinol (ZYLOPRIM) 100 MG tablet Take 450 mg by mouth daily.    Yes Historical Provider, MD  aspirin EC 81 MG tablet Take 81 mg by mouth daily.   Yes Historical Provider, MD  atorvastatin (LIPITOR) 80 MG tablet Take 80 mg by mouth at bedtime.    Yes Historical Provider, MD  Calcium Citrate-Vitamin D (CALCIUM + D PO) Take 1 tablet by mouth daily.   Yes Historical Provider, MD  cyclobenzaprine (FLEXERIL) 10 MG tablet Take 10 mg by mouth 3 (three) times daily as needed for muscle spasms (muscle spasms).   Yes Historical Provider, MD  divalproex (DEPAKOTE) 500 MG DR tablet Take 1,000 mg by mouth at bedtime.    Yes Historical Provider, MD  fenofibrate (TRICOR) 145 MG tablet Take 145 mg by mouth daily.    Yes Historical Provider, MD  losartan (COZAAR) 50 MG tablet Take 50 mg by mouth daily.   Yes Historical Provider, MD  metoprolol (LOPRESSOR) 100 MG tablet Take 50 mg by mouth daily.   Yes Historical Provider, MD  nitroGLYCERIN (NITROSTAT) 0.4 MG SL tablet Place 0.4 mg under the tongue every 5 (five) minutes as needed for chest pain. Repeat every 5 minutes up to 3 doses.  If no relief seek medical help (Do not take Cialis, Viagra or Levitra while you are on this medication)   Yes Historical Provider, MD  Omega-3 Fatty Acids (FISH OIL) 1000 MG CAPS Take 2,000 mg by mouth 2 (two) times daily.   Yes Historical Provider, MD  oxyCODONE (OXY IR/ROXICODONE) 5 MG immediate release tablet Take 5 mg by mouth every 6 (six) hours as needed for severe pain.   Yes Historical Provider, MD  pantoprazole (PROTONIX) 40 MG tablet Take 40 mg by mouth 2 (two) times daily.   Yes Historical Provider, MD    Physical Exam: Vitals:   03/26/17 2230 03/26/17 2300 03/26/17 2330 03/27/17 0000  BP: 102/73 137/85 135/88 133/89    Pulse: 63 65 62 64  Resp: Temp:      TempSrc:      SpO2: 99% 100% 100% 100%  Weight:      Height:          Constitutional: NAD, calm, comfortable Vitals:   03/26/17 2230 03/26/17 2300 03/26/17 2330 03/27/17 0000  BP: 102/73 137/85 135/88 133/89  Pulse: 63 65 62 64  Resp: Temp:      TempSrc:      SpO2: 99% 100% 100% 100%  Weight:      Height:       Eyes: PERRL, lids and conjunctivae normal ENMT: Mucous membranes are moist. Posterior pharynx clear of any exudate or lesions. Normal dentition.  Neck: normal appearance, supple,  no masses Respiratory: clear to auscultation bilaterally, no wheezing, no crackles. Normal respiratory effort. No accessory muscle use.  Cardiovascular: Normal rate, regular rhythm, Split S2.  No murmurs.  No extremity edema. 2+ pedal pulses. No carotid bruits.  GI: abdomen is soft and compressible.  Small ventral hernia present.  No distention.  No tenderness.  Bowel sounds are present. Musculoskeletal:  No joint deformity in upper and lower extremities. Reduced ROM in RUE due to rotator cuff injury.  No contractures. Normal muscle tone.  Skin: no rashes, warm and dry Neurologic: Sensation intact, Strength symmetric in legs.  Strength reduced in right arm due to rotator cuff injury. Psychiatric: Normal judgment and insight. Alert and oriented x 3. Normal mood.     Labs on Admission: I have personally reviewed following labs and imaging studies  CBC:  Recent Labs Lab 03/26/17 2104  WBC 8.3  HGB 12.3*  HCT 35.7*  MCV 88.8  PLT 197   Basic Metabolic Panel:  Recent Labs Lab 03/26/17 2104  NA 136  K 3.9  CL 103  CO2 24  GLUCOSE 113*  BUN 18  CREATININE 2.40*  CALCIUM 9.3   GFR: Estimated Creatinine Clearance: 34.7 mL/min (A) (by C-G formula based on SCr of 2.4 mg/dL (H)).  CBG:  Recent Labs Lab 03/26/17 2107  GLUCAP 125*   Urine analysis:    Component Value Date/Time   COLORURINE YELLOW 03/26/2017  2330   APPEARANCEUR HAZY (A) 03/26/2017 2330   LABSPEC 1.010 03/26/2017 2330   PHURINE 5.0 03/26/2017 2330   GLUCOSEU NEGATIVE 03/26/2017 2330   HGBUR NEGATIVE 03/26/2017 2330   BILIRUBINUR NEGATIVE 03/26/2017 2330   KETONESUR NEGATIVE 03/26/2017 2330   PROTEINUR NEGATIVE 03/26/2017 2330   UROBILINOGEN 0.2 03/28/2010 0629   NITRITE NEGATIVE 03/26/2017 2330   LEUKOCYTESUR NEGATIVE 03/26/2017 2330   Urine drug screen positive for benzodiazepines  Valproic acid level less than 10  Radiological Exams on Admission: No results found.  EKG: Independently reviewed by me.  NSR.  Mild ST depressions in I and avL.  Assessment/Plan Principal Problem:   Orthostatic hypotension Active Problems:   CAD, CABG X 27 March 2008   Bipolar 1 disorder (HCC)   Smoker   PVD, Lt FPBPG at [facility] Oct 2012   Ischemic cardiomyopathy, EF 40% 2009   AKI (acute kidney injury) (HCC)      AKI, likely related to relative hypotension that has probably been present for at least a few days based on history.  No evidence of UTI. --Check renal ultrasound --HOLD ARB, avoid nephrotoxic agents, renally dose medications as appropriate --Expect improvement with hydration, improved blood pressures --Repeat BMP in the AM  Orthostatic hypotension, etiology unclear.  He has had atypical chest pain and a UDS suggestive of benzodiazepine use. --Repeat orthostatic vitals upon arrival to the floor (after fluid bolus) and daily --NS at 100cc/hr for an additional liter then STOP --Serial troponin --Echo without contrast in the AM --HOLD beta blocker and ARB for now  CAD --Baby aspirin, statin, fenofibrate  History of gout --Allopurinol  History of Bipolar disorder --Depakote, levels are subtherapeutic  Chronic pain presumably secondary to rotator cuff injury; surgery pending --Oxycodone --flexeril     DVT prophylaxis: SCDs Code Status: FULL Family Communication: Daughter present at bedside at time of  admission. Disposition Plan: To be determined.  Of note, the patient reports that he is scheduled for cardiac evaluation for "clearance" for his rotator cuff surgery at the [place] in [place] on Friday. Consults  called: NONE Admission status: Place in observation with telemetry monitoring.   TIME SPENT: 60 minutes   Jerene Bears MD [facility] Pager [phone]  If 7PM-7AM, please contact night-coverage [email or web address] Password [facility]  03/27/2017, 12:28 AM

## 2017-03-28 DIAGNOSIS — I5022 Chronic systolic (congestive) heart failure: Secondary | ICD-10-CM

## 2017-03-28 DIAGNOSIS — I255 Ischemic cardiomyopathy: Secondary | ICD-10-CM

## 2017-03-28 DIAGNOSIS — N179 Acute kidney failure, unspecified: Secondary | ICD-10-CM

## 2017-03-28 LAB — BASIC METABOLIC PANEL
ANION GAP: 8 (ref 5–15)
BUN: 16 mg/dL (ref 6–20)
CO2: 24 mmol/L (ref 22–32)
Calcium: 8.4 mg/dL — ABNORMAL LOW (ref 8.9–10.3)
Chloride: 107 mmol/L (ref 101–111)
Creatinine, Ser: 1.06 mg/dL (ref 0.61–1.24)
GFR calc Af Amer: >60 mL/min (ref >60)
GLUCOSE: 97 mg/dL (ref 65–99)
POTASSIUM: 4.2 mmol/L (ref 3.5–5.1)
Sodium: 139 mmol/L (ref 135–145)

## 2017-03-28 MED ORDER — METOPROLOL TARTRATE 25 MG PO TABS: 25.0000 mg | ORAL_TABLET | Freq: Two times a day (BID) | ORAL | 1 refills | Status: DC

## 2017-03-28 NOTE — Progress Notes (Signed)
Patient A&Ox4, ambulatory without assist. Discharge instructions reviewed. Questions concerns denied.

## 2017-03-28 NOTE — Discharge Summary (Signed)
Physician Discharge Summary  [name]:[id number] DOB: 22-Oct-1956 DOA: 03/26/2017  PCP: No PCP Per Patient  Admit date: 03/26/2017 Discharge date: 03/28/2017  Time spent: 35 minutes  Recommendations for Outpatient Follow-up:  1. Repeat BMET to follow electrolytes and renal function  2. Reassess BP and adjust antihypertensive regimen as needed    Discharge Diagnoses:  Principal Problem:   Orthostatic hypotension Active Problems:   CAD, CABG X 27 March 2008   Bipolar 1 disorder (HCC)   Smoker   PVD, Lt FPBPG at [facility] Oct 2012   Ischemic cardiomyopathy, EF 40% 2009   AKI (acute kidney injury) (HCC)   Chest pain   Acute renal failure (HCC)   Chronic systolic CHF (congestive heart failure) (HCC)   Discharge Condition: stable and improved. Advise to follow in 10 days with PCP and to follow up with cardiologist as previously instructed.   Diet recommendation: heart healthy diet   Filed Weights   03/26/17 2051 03/27/17 0027 03/28/17 0432  Weight: 86.2 kg (190 lb) 86.4 kg (190 lb 7.6 oz) 87.5 kg (192 lb 14.4 oz)    History of present illness:  61 y.o. gentleman with a history of CAD S/P 5v CABG in 2009, recurrent MI, S/P 2 BMS to bypass graft, PAD with history of femoral-pop bypass, BiPolar disorder, medical noncompliance, and right rotator cuff injury (anticipating surgical intervention soon) who presents to the ED for evaluation of new onset dizziness when going from lying to sitting or sitting to standing. Found to have acute renal failure, orthostatic hypotensive and with positive UDS for benzodiazepines.  Hospital Course:  ARF, likely related to relative hypotension and continue use of nephrotoxic agents --resolved after IVF's given and medications held --at discharge Cr and renal function back to norml --will resume antihypertensive agents and patient advise to maintain adequate hydration --no signs of UTI and no hydronephrosis or obstructive uropathy on renal  US --repeat BMET to follow renal function at follow up visit.  Orthostatic hypotension, etiology unclear.  He used valium from his mother around development of symptoms. Also found to have low BP. --Repeat orthostatic vitals signs after IVF's resuscitation WNL --advise to follow antihypertensive regimen (with adjusted dose) as prescribed --encouraged to avoid use of drugs not prescribed to him --also instructed to maintain adequate hydration --no further orthostasis, lightheadedness and/or dizziness prior to discharge  CAD --will resume B-blocker (at adjusted dose), continue cozaar, Baby aspirin, statin and fenofibrate --neg troponin and no acute ischemic changes on EKG --will follow up with his cardiologist as an outpatient.  Chronic systolic heart failure --ischemic in nature most likely --will continue b-blocker and ARB --EF 35-40% --advise to follow low sodium diet and to follow weight on daily basis   History of gout --Continue Allopurinol --No Acute flare   History of Bipolar disorder --Continue Depakote --no SI or hallucinations   Chronic pain presumably secondary to rotator cuff injury; surgery pending --continue PRN Oxycodone and flexeril  Procedures:  See below for x-ray reports   2-D echo - Left ventricle: The cavity size was mildly dilated. There was   moderate concentric hypertrophy. Systolic function was moderately   reduced. The estimated ejection fraction was in the range of 35%   to 40%. Diffuse hypokinesis. Features are consistent with a   pseudonormal left ventricular filling pattern, with concomitant   abnormal relaxation and increased filling pressure (grade 2   diastolic dysfunction). Doppler parameters are consistent with   elevated ventricular end-diastolic filling pressure. - Aortic  valve: Bicuspid; severely thickened, severely calcified   leaflets. There was mild stenosis. - Ascending aorta: The ascending aorta was normal in size. - Left  atrium: The atrium was moderately dilated. - Right ventricle: The cavity size was mildly dilated. Wall   thickness was normal. Systolic function was moderately reduced. - Tricuspid valve: There was trivial regurgitation. - Pulmonary arteries: Systolic pressure was within the normal   range. - Inferior vena cava: The vessel was normal in size. - Pericardium, extracardiac: There was no pericardial effusion.  Impressions: - Since the last study on 10/08/2014 LVEF has decreased from 50-55%   to 35-40% with diffuse hypokinesis.   Aortic valve is uni or bicuspid with mild aortic stenosis.   RVEF is mildly decreased.  Consultations:  None   Discharge Exam: Vitals:   03/28/17 0432 03/28/17 1427  BP: 116/63 122/75  Pulse: 66 80  Resp: 16 18  Temp: 98.1 F (36.7 C) 97.7 F (36.5 C)    General: afebrile, no CP, no SOB and denies any further lightheadedness or dizziness sensation. Repeat orthostatic VS WNL. Cardiovascular: S1 and S2, no rubs, no gallops, no JVD Respiratory: CTA bilaterally abd: soft, NT, ND, positive BS Extremities: no edema or cyanosis   Discharge Instructions   Discharge Instructions    (HEART FAILURE PATIENTS) Call MD:  Anytime you have any of the following symptoms: 1) 3 pound weight gain in 24 hours or 5 pounds in 1 week 2) shortness of breath, with or without a dry hacking cough 3) swelling in the hands, feet or stomach 4) if you have to sleep on extra pillows at night in order to breathe.    Complete by:  As directed    Diet - low sodium heart healthy    Complete by:  As directed    Discharge instructions    Complete by:  As directed    Follow low sodium diet Check weight on daily basis  Maintain adequate hydration Take medications as prescribed and do not use any drug not prescribed to you  Follow up with your cardiologist as previously planned and arranged follow up with PCP in 10 days     Current Discharge Medication List    CONTINUE these  medications which have CHANGED   Details  metoprolol (LOPRESSOR) 25 MG tablet Take 1 tablet (25 mg total) by mouth 2 (two) times daily. Qty: 60 tablet, Refills: 1      CONTINUE these medications which have NOT CHANGED   Details  albuterol (PROVENTIL HFA;VENTOLIN HFA) 108 (90 BASE) MCG/ACT inhaler Inhale 2 puffs into the lungs every 6 (six) hours as needed. WHEEZING    allopurinol (ZYLOPRIM) 100 MG tablet Take 450 mg by mouth daily.     aspirin EC 81 MG tablet Take 81 mg by mouth daily.    atorvastatin (LIPITOR) 80 MG tablet Take 80 mg by mouth at bedtime.     Calcium Citrate-Vitamin D (CALCIUM + D PO) Take 1 tablet by mouth daily.    cyclobenzaprine (FLEXERIL) 10 MG tablet Take 10 mg by mouth 3 (three) times daily as needed for muscle spasms (muscle spasms).    divalproex (DEPAKOTE) 500 MG DR tablet Take 1,000 mg by mouth at bedtime.     fenofibrate (TRICOR) 145 MG tablet Take 145 mg by mouth daily.     losartan (COZAAR) 50 MG tablet Take 50 mg by mouth daily.    nitroGLYCERIN (NITROSTAT) 0.4 MG SL tablet Place 0.4 mg under the tongue every 5 (five)  minutes as needed for chest pain. Repeat every 5 minutes up to 3 doses.  If no relief seek medical help (Do not take Cialis, Viagra or Levitra while you are on this medication)    Omega-3 Fatty Acids (FISH OIL) 1000 MG CAPS Take 2,000 mg by mouth 2 (two) times daily.    oxyCODONE (OXY IR/ROXICODONE) 5 MG immediate release tablet Take 5 mg by mouth every 6 (six) hours as needed for severe pain.    pantoprazole (PROTONIX) 40 MG tablet Take 40 mg by mouth 2 (two) times daily.       Allergies  Allergen Reactions  . Prednisone Other (See Comments)    Causes steroid rage (takes regularly at home though)  . Lisinopril Cough    The results of significant diagnostics from this hospitalization (including imaging, microbiology, ancillary and laboratory) are listed below for reference.    Significant Diagnostic Studies: US  Renal  Result Date: 2017-03-29 CLINICAL DATA:  61 y/o  M; acute kidney injury. EXAM: RENAL / URINARY TRACT ULTRASOUND COMPLETE COMPARISON:  Abdominal ultrasound dated 03/28/2010 FINDINGS: Right Kidney: Length: 10.1. Echogenicity within normal limits. No mass or hydronephrosis visualized. Left Kidney: Length: 10.5. Echogenicity within normal limits. No mass or hydronephrosis visualized. Bladder: Appears normal for degree of bladder distention. Other: Diffusely echogenic liver compatible with steatosis. IMPRESSION: 1. Normal ultrasound appearance of the kidneys. 2. Hepatic steatosis. Electronically Signed   By: Mitzi Hansen M.D.   On: Mar 29, 2017 05:26    Microbiology: Recent Results (from the past 240 hour(s))  MRSA PCR Screening     Status: None   Collection Time: 03-29-2017 12:41 AM  Result Value Ref Range Status   MRSA by PCR NEGATIVE NEGATIVE Final    Comment:        The GeneXpert MRSA Assay (FDA approved for NASAL specimens only), is one component of a comprehensive MRSA colonization surveillance program. It is not intended to diagnose MRSA infection nor to guide or monitor treatment for MRSA infections.      Labs: Basic Metabolic Panel:  Recent Labs Lab 03/26/17 2104 03-29-2017 0514 03/28/17 0509  NA 136 139 139  K 3.9 3.9 4.2  CL 103 107 107  CO2 GLUCOSE 113* 103* 97  BUN CREATININE 2.40* 1.53* 1.06  CALCIUM 9.3 8.5* 8.4*   Liver Function Tests:  Recent Labs Lab 03-29-17 0010  AST 16  ALT 19  ALKPHOS 46  BILITOT 0.5  PROT 6.2*  ALBUMIN 3.8   CBC:  Recent Labs Lab 03/26/17 2104 March 29, 2017 0514  WBC 8.3 6.5  HGB 12.3* 10.8*  HCT 35.7* 31.9*  MCV 88.8 89.4  PLT 197 154   Cardiac Enzymes:  Recent Labs Lab 03/29/17 0010 March 29, 2017 0514  TROPONINI <0.03 <0.03   CBG:  Recent Labs Lab 03/26/17 2107  GLUCAP 125*    Signed:  Vassie Loll MD.  [facility] 03/28/2017, 2:49 PM

## 2017-09-19 ENCOUNTER — Encounter: Payer: Self-pay | Admitting: Physician Assistant

## 2017-10-02 ENCOUNTER — Encounter: Payer: Self-pay | Admitting: Physician Assistant

## 2017-10-02 ENCOUNTER — Ambulatory Visit (INDEPENDENT_AMBULATORY_CARE_PROVIDER_SITE_OTHER): Payer: Non-veteran care | Admitting: Physician Assistant

## 2017-10-02 DIAGNOSIS — K2270 Barrett's esophagus without dysplasia: Secondary | ICD-10-CM

## 2017-10-02 DIAGNOSIS — K219 Gastro-esophageal reflux disease without esophagitis: Secondary | ICD-10-CM

## 2017-10-02 DIAGNOSIS — Z86010 Personal history of colonic polyps: Secondary | ICD-10-CM

## 2017-10-02 DIAGNOSIS — Z955 Presence of coronary angioplasty implant and graft: Secondary | ICD-10-CM

## 2017-10-02 NOTE — Patient Instructions (Signed)
[facility] signed for [place].  We will be in contact at a later time about scheduling the Endoscopy with our office.  We will contact Cardiology at the [facility] and Dr. Collier Salina office.

## 2017-10-02 NOTE — Progress Notes (Addendum)
Subjective:    Patient ID: [name], male    DOB: December 26, 1955, 61 y.o.   MRN: [id number]  HPI [name] is a pleasant 61 year old white male, new to GI today referred by the [facility] for surveillance endoscopy with history of Barrett's esophagus. Patient states that he gets most all of his care through the [facility] and has had serial endoscopies and colonoscopies there. He has history of colon polyps and says he had colonoscopy within the past year. He reports that he had been having endoscopies every year for while and then every 6 months for while and more recently went back to annually. I have absolutely no records from the [place] today, and patient is unaware whether he has had efforts with dysplasia. He is referred here apparently because the [place] is backed up with procedures. He has no current complaints of heartburn or indigestion no dysphagia or diarrhea and aphasia. He stays on Protonix 40 mg daily which controls his symptoms. Patient does have multiple other medical issues which includes coronary artery disease status post CABG in 2009. He had an MI in 2013 and again in 2014 with stents. He has congestive heart failure with EF of 35-40% as of 2018, peripheral vascular disease status post femoral artery bypass in 2010. Also with bipolar disorder and history of hepatitis C. Patient states that he had a recent cardiac cath done at the [facility] on 08/23/2017, he brought his card with him which shows the did have a drug-eluting stent placed. I do not have her report, and patient does not know which vessel was stented. He has been on Plavix long term and aspirin 81 mg daily. His PCP at the [facility] is a Dr. Delice Bison [phone] He has seen a Darl Pikes Chale[place]vong/Nurse practitioner for cardiology, [phone] [place].   Review of Systems Pertinent positive and negative review of systems were noted in the above HPI section.  All other review of systems was otherwise  negative.  Outpatient Encounter Prescriptions as of 10/02/2017  Medication Sig  . albuterol (PROVENTIL HFA;VENTOLIN HFA) 108 (90 BASE) MCG/ACT inhaler Inhale 2 puffs into the lungs every 6 (six) hours as needed. WHEEZING  . allopurinol (ZYLOPRIM) 100 MG tablet Take 450 mg by mouth daily.   Marland Kitchen ammonium lactate (AMLACTIN) 12 % cream Apply topically as needed for dry skin.  Marland Kitchen aspirin EC 81 MG tablet Take 81 mg by mouth daily.  Marland Kitchen atorvastatin (LIPITOR) 80 MG tablet Take 80 mg by mouth at bedtime.   . Calcium Citrate-Vitamin D (CALCIUM + D PO) Take 1 tablet by mouth daily.  . clopidogrel (PLAVIX) 75 MG tablet Take 75 mg by mouth daily.  . cyclobenzaprine (FLEXERIL) 10 MG tablet Take 10 mg by mouth 3 (three) times daily as needed for muscle spasms (muscle spasms).  . divalproex (DEPAKOTE) 500 MG DR tablet Take 1,000 mg by mouth at bedtime.   . fenofibrate (TRICOR) 145 MG tablet Take 145 mg by mouth daily.   Marland Kitchen losartan (COZAAR) 50 MG tablet Take 50 mg by mouth daily.  . metoprolol (LOPRESSOR) 25 MG tablet Take 1 tablet (25 mg total) by mouth 2 (two) times daily. (Patient taking differently: Take 100 mg by mouth 2 (two) times daily. )  . nicotine (NICODERM CQ - DOSED IN MG/24 HOURS) 21 mg/24hr patch Place 21 mg onto the skin daily.  . nitroGLYCERIN (NITROSTAT) 0.4 MG SL tablet Place 0.4 mg under the tongue every 5 (five) minutes as  needed for chest pain. Repeat every 5 minutes up to 3 doses.  If no relief seek medical help (Do not take Cialis, Viagra or Levitra while you are on this medication)  . Omega-3 Fatty Acids (FISH OIL) 1000 MG CAPS Take 2,000 mg by mouth 2 (two) times daily.  Marland Kitchen oxyCODONE (OXY IR/ROXICODONE) 5 MG immediate release tablet Take 5 mg by mouth every 6 (six) hours as needed for severe pain.  . pantoprazole (PROTONIX) 40 MG tablet Take 40 mg by mouth 2 (two) times daily.   No facility-administered encounter medications on file as of 10/02/2017.    Allergies  Allergen Reactions  .  Prednisone Other (See Comments)    Causes steroid rage (takes regularly at home though)  . Lisinopril Cough   Patient Active Problem List   Diagnosis Date Noted  . Acute renal failure (HCC)   . Chronic systolic CHF (congestive heart failure) (HCC)   . AKI (acute kidney injury) (HCC) 03/27/2017  . Chest pain   . Orthostatic hypotension 03/26/2017  . S/P angioplasty with stent SVG-RCA 3/13 (left AMA without Plavix), and again SVG-RCA BMS 10/09/13 10/10/2013  . Hypertriglyceridemia 10/09/2013  . Acute myocardial infarction of other inferior wall, initial episode of care   . STEMI (ST elevation myocardial infarction) (HCC) 03/04/2012  . CAD, CABG X 27 March 2008 03/04/2012  . Bipolar 1 disorder (HCC) 03/04/2012  . Homeless 03/04/2012  . Noncompliance with diet and medication regimen 03/04/2012  . Smoker 03/04/2012  . PVD, Lt FPBPG at [facility] Oct 2012 03/04/2012  . Ischemic cardiomyopathy, EF 40% 2009 03/04/2012  . CAD - Inf STEMI in 2013 & now in 09/2013 03/04/2012  . HEPATITIS C 02/20/2007  . HYPERLIPIDEMIA 02/20/2007  . GOUT, UNSPECIFIED 02/20/2007  . TOBACCO DEPENDENCE 02/20/2007  . HERNIA, HIATAL, NONCONGENITAL 02/20/2007  . GASTROINTESTINAL HEMORRHAGE 02/20/2007  . ARTHRITIS 02/20/2007   Social History   Social History  . Marital status: Divorced    Spouse name: N/A  . Number of children: N/A  . Years of education: N/A   Occupational History  . disabled    Social History Main Topics  . Smoking status: Current Every Day Smoker    Packs/day: 0.50    Types: Cigarettes  . Smokeless tobacco: Never Used     Comment: Uses Nicotene patch  . Alcohol use No     Comment: former drinker stopped 6 years ago   . Drug use: No     Comment: Former Marijuana user of 4 years  . Sexual activity: Not Currently   Other Topics Concern  . Not on file   Social History Narrative   Homeless Veteran. Divorced.   + Smoker - 1ppd    Mr. Asa's family history includes Colon cancer  in his paternal grandfather; Heart disease in his father and mother.      Objective:    Vitals:   10/02/17 1052  BP: 122/80  Pulse: 67  SpO2: 99%    Physical Exam  well-developed older white male in no acute distress, pleasant blood pressure 122/80 pulse 67, weight 185, height 5 foot 9, BMI 27.3. HEENT; nontraumatic normocephalic EOMI PERRLA sclera anicteric, Cardiovascular ;regular rate and rhythm with S1-S2 soft systolic murmur,, sternal incisional scar Pulmonary ;clear bilaterally, Abdomen; soft, nontender nondistended bowel sounds are active there is no palpable mass or hepatosplenomegaly up rectal exam not done, Extremities; no clubbing cyanosis or edema skin warm and dry, Neuropsych; mood and affect appropriate  Assessment & Plan:   #32 61 year old white male with reported history of Barrett's esophagus who has undergone serial surveillance endoscopies through the [facility]- currently referred here for follow-up EGD because of back up with scheduling. I do not have any records today to confirm Barrett's. Patient reports he had been undergoing annual surveillance recently #2 chronic GERD-controlled on Protonix daily #3 history of colon polyps-serial colonoscopies done through [facility] #4 coronary artery disease status post CABG 2009, status post MI 2013 and 2014 with stent placements and patient had a very recent drug-eluting stent placed on 08/23/2017. #5 ischemic cardiomyopathy with EF of 35-40% as of April 2018 #6 chronic antiplatelet therapy-on Plavix and aspirin #7 peripheral vascular disease status post femoral artery bypass 2010 #8 status post cholecystectomy #9 history of hep C #10 bipolar disorder  Plan; We will attempt to obtain records from the [facility] regarding prior EGDs and path reports. Patient is not appropriate for semi- elective procedure currently given very recent coronary stent placement on 08/23/2017. We will need to communicate with his  cardiologist at the [facility] for recommendations regarding duration of uninterrupted Plavix therapy prior to holding for EGD with biopsies. Would expect they would not want Plavix held any sooner than 6 months post new drug-eluting stent. Plans were all discussed with the patient and he understands rationale for holding off on EGD at present. We will contact the patient once we get his records, and clearance from cardiology for potential EGD with biopsies in late February or early March 2018. Another option would be for him to be scheduled for follow-up EGD spring 2019 through the [facility]. Patient will be established with Dr. Russella Dar. Greater than 50% of this visit was spent in counseling, education and coordination of care.  Addendum; previous records received, colonoscopy May 2001 Dr. Loreta Ave showed left-sided diverticular disease, few right-sided diverticuli no polyps, upper endoscopy November 2001 per Dr. Loreta Ave small hiatal hernia and mild antral gastritis, EGD August 2005 Dr. Loreta Ave, grade 1 distal esophagitis, 3-4 cm hiatal hernia and antral erosions diffuse gastritis,  Colonoscopy October 2005 Dr. Loreta Ave small internal hemorrhoids scattered pandiverticulosis no masses or polyps and large amount of residual stool in the colon.  Wendelin Bradt S Katria Botts PA-C 10/02/2017   Cc: No ref. provider found

## 2017-10-03 NOTE — Progress Notes (Signed)
Reviewed and agree with management plan. Given his new drug eluting stent it could be Sept 2019 (12 months) before he can safely stop Plavix for an elective EGD. In addition we do not have his GI records. I recommend that he resume care with his [facility] who can coordinate EGD timing.  Venita Lick. Russella Dar, MD [facility]

## 2017-10-04 ENCOUNTER — Other Ambulatory Visit: Payer: Self-pay | Admitting: *Deleted

## 2017-10-07 ENCOUNTER — Telehealth: Payer: Self-pay | Admitting: *Deleted

## 2017-10-07 NOTE — Progress Notes (Signed)
Pam, please call pt and ask him to get in touch with GI clinic at [facility] , and try to get scheduled with them in Spring.

## 2017-10-07 NOTE — Telephone Encounter (Signed)
LM for patient to please call me.  Amy Esterwood PA suggested I call him and tell him that we think he might want to ask someone at the [place] about putting him on the schedule for an EGD for March 2019. That would be 6 months from his last stent placement August 2018.  I faxed the anti-coagulation letter to Pearson Forster NP  , fax # [phone].

## 2017-10-09 NOTE — Telephone Encounter (Signed)
Patient returned phone call. Best # [phone]

## 2017-10-09 NOTE — Telephone Encounter (Signed)
Called the patient back. I told him that we would be glad to do his Endoscopy however we won't do it until at least March 2019 due to him having a cardiac stent placed in August 2018.  I asked him if [facility] [place] could [facility] about perhaps them scheduling him for an EGD in March of 2019.  He said he has an [facility]-2017 with his PCP there. He said he may mention it to that doctor.  Also he deals with someone that clears him to get outside care other than [place], which we would fall under that category.  I told him if I hear back from Hollie BeachSusam Chaleunvong NP from the Cardiac division of the [facility], I will call him and let him know.  I faxed her an anticoagulation letter on 12-04-2017.  I asked him to call and ask for me when he gets back to us.

## 2018-03-18 ENCOUNTER — Encounter (HOSPITAL_COMMUNITY): Payer: Self-pay

## 2018-03-18 ENCOUNTER — Inpatient Hospital Stay (HOSPITAL_COMMUNITY)
Admission: EM | Admit: 2018-03-18 | Discharge: 2018-03-25 | DRG: 871 | Disposition: A | Discharge status: Home / Self Care | Payer: Non-veteran care | Attending: Internal Medicine | Admitting: Internal Medicine

## 2018-03-18 ENCOUNTER — Other Ambulatory Visit: Payer: Self-pay

## 2018-03-18 ENCOUNTER — Emergency Department (HOSPITAL_COMMUNITY): Payer: Non-veteran care

## 2018-03-18 ENCOUNTER — Telehealth (HOSPITAL_COMMUNITY): Payer: Self-pay

## 2018-03-18 DIAGNOSIS — I25810 Atherosclerosis of coronary artery bypass graft(s) without angina pectoris: Secondary | ICD-10-CM | POA: Diagnosis present

## 2018-03-18 DIAGNOSIS — Z66 Do not resuscitate: Secondary | ICD-10-CM | POA: Diagnosis present

## 2018-03-18 DIAGNOSIS — I482 Chronic atrial fibrillation: Secondary | ICD-10-CM | POA: Diagnosis present

## 2018-03-18 DIAGNOSIS — I5043 Acute on chronic combined systolic (congestive) and diastolic (congestive) heart failure: Secondary | ICD-10-CM | POA: Diagnosis present

## 2018-03-18 DIAGNOSIS — Z7982 Long term (current) use of aspirin: Secondary | ICD-10-CM

## 2018-03-18 DIAGNOSIS — Z951 Presence of aortocoronary bypass graft: Secondary | ICD-10-CM

## 2018-03-18 DIAGNOSIS — I110 Hypertensive heart disease with heart failure: Secondary | ICD-10-CM | POA: Diagnosis present

## 2018-03-18 DIAGNOSIS — Z7902 Long term (current) use of antithrombotics/antiplatelets: Secondary | ICD-10-CM

## 2018-03-18 DIAGNOSIS — A419 Sepsis, unspecified organism: Secondary | ICD-10-CM | POA: Diagnosis not present

## 2018-03-18 DIAGNOSIS — Z800 Family history of malignant neoplasm of digestive organs: Secondary | ICD-10-CM

## 2018-03-18 DIAGNOSIS — K920 Hematemesis: Secondary | ICD-10-CM

## 2018-03-18 DIAGNOSIS — K449 Diaphragmatic hernia without obstruction or gangrene: Secondary | ICD-10-CM

## 2018-03-18 DIAGNOSIS — K2270 Barrett's esophagus without dysplasia: Secondary | ICD-10-CM | POA: Diagnosis present

## 2018-03-18 DIAGNOSIS — I4891 Unspecified atrial fibrillation: Secondary | ICD-10-CM

## 2018-03-18 DIAGNOSIS — I70209 Unspecified atherosclerosis of native arteries of extremities, unspecified extremity: Secondary | ICD-10-CM | POA: Diagnosis present

## 2018-03-18 DIAGNOSIS — E785 Hyperlipidemia, unspecified: Secondary | ICD-10-CM | POA: Diagnosis present

## 2018-03-18 DIAGNOSIS — G8929 Other chronic pain: Secondary | ICD-10-CM | POA: Diagnosis present

## 2018-03-18 DIAGNOSIS — I2510 Atherosclerotic heart disease of native coronary artery without angina pectoris: Secondary | ICD-10-CM | POA: Diagnosis present

## 2018-03-18 DIAGNOSIS — Z95820 Peripheral vascular angioplasty status with implants and grafts: Secondary | ICD-10-CM

## 2018-03-18 DIAGNOSIS — F17210 Nicotine dependence, cigarettes, uncomplicated: Secondary | ICD-10-CM | POA: Diagnosis present

## 2018-03-18 DIAGNOSIS — Q231 Congenital insufficiency of aortic valve: Secondary | ICD-10-CM

## 2018-03-18 DIAGNOSIS — Z8249 Family history of ischemic heart disease and other diseases of the circulatory system: Secondary | ICD-10-CM

## 2018-03-18 DIAGNOSIS — N179 Acute kidney failure, unspecified: Secondary | ICD-10-CM | POA: Diagnosis present

## 2018-03-18 DIAGNOSIS — D649 Anemia, unspecified: Secondary | ICD-10-CM | POA: Diagnosis present

## 2018-03-18 DIAGNOSIS — Z955 Presence of coronary angioplasty implant and graft: Secondary | ICD-10-CM

## 2018-03-18 DIAGNOSIS — I5022 Chronic systolic (congestive) heart failure: Secondary | ICD-10-CM | POA: Diagnosis present

## 2018-03-18 DIAGNOSIS — I480 Paroxysmal atrial fibrillation: Secondary | ICD-10-CM | POA: Diagnosis present

## 2018-03-18 DIAGNOSIS — I214 Non-ST elevation (NSTEMI) myocardial infarction: Secondary | ICD-10-CM

## 2018-03-18 DIAGNOSIS — J189 Pneumonia, unspecified organism: Secondary | ICD-10-CM

## 2018-03-18 DIAGNOSIS — I252 Old myocardial infarction: Secondary | ICD-10-CM

## 2018-03-18 DIAGNOSIS — K219 Gastro-esophageal reflux disease without esophagitis: Secondary | ICD-10-CM | POA: Diagnosis present

## 2018-03-18 DIAGNOSIS — E869 Volume depletion, unspecified: Secondary | ICD-10-CM | POA: Diagnosis present

## 2018-03-18 DIAGNOSIS — R112 Nausea with vomiting, unspecified: Secondary | ICD-10-CM

## 2018-03-18 DIAGNOSIS — F3181 Bipolar II disorder: Secondary | ICD-10-CM | POA: Diagnosis present

## 2018-03-18 DIAGNOSIS — J690 Pneumonitis due to inhalation of food and vomit: Secondary | ICD-10-CM | POA: Diagnosis present

## 2018-03-18 LAB — CBC WITH DIFFERENTIAL/PLATELET
Basophils Absolute: 0 10*3/uL (ref 0.0–0.1)
Basophils Relative: 0 %
EOS ABS: 0.1 10*3/uL (ref 0.0–0.7)
Eosinophils Relative: 2 %
HCT: 31.7 % — ABNORMAL LOW (ref 39.0–52.0)
Hemoglobin: 10 g/dL — ABNORMAL LOW (ref 13.0–17.0)
LYMPHS ABS: 0.8 10*3/uL (ref 0.7–4.0)
Lymphocytes Relative: 26 %
MCH: 26.7 pg (ref 26.0–34.0)
MCHC: 31.5 g/dL (ref 30.0–36.0)
MCV: 84.8 fL (ref 78.0–100.0)
MONO ABS: 0.2 10*3/uL (ref 0.1–1.0)
Monocytes Relative: 5 %
Neutro Abs: 2 10*3/uL (ref 1.7–7.7)
Neutrophils Relative %: 67 %
PLATELETS: 178 10*3/uL (ref 150–400)
RBC: 3.74 MIL/uL — AB (ref 4.22–5.81)
RDW: 16.3 % — AB (ref 11.5–15.5)
WBC: 3 10*3/uL — AB (ref 4.0–10.5)

## 2018-03-18 LAB — COMPREHENSIVE METABOLIC PANEL
ALBUMIN: 4.1 g/dL (ref 3.5–5.0)
ALT: 11 U/L — ABNORMAL LOW (ref 17–63)
ANION GAP: 12 (ref 5–15)
AST: 19 U/L (ref 15–41)
Alkaline Phosphatase: 48 U/L (ref 38–126)
BILIRUBIN TOTAL: 0.9 mg/dL (ref 0.3–1.2)
BUN: 21 mg/dL — ABNORMAL HIGH (ref 6–20)
CALCIUM: 9 mg/dL (ref 8.9–10.3)
CO2: 20 mmol/L — ABNORMAL LOW (ref 22–32)
Chloride: 104 mmol/L (ref 101–111)
Creatinine, Ser: 2.41 mg/dL — ABNORMAL HIGH (ref 0.61–1.24)
GFR calc Af Amer: 31 mL/min — ABNORMAL LOW (ref >60)
GFR, EST NON AFRICAN AMERICAN: 27 mL/min — AB (ref >60)
Glucose, Bld: 78 mg/dL (ref 65–99)
POTASSIUM: 4.4 mmol/L (ref 3.5–5.1)
Sodium: 136 mmol/L (ref 135–145)
TOTAL PROTEIN: 6.4 g/dL — AB (ref 6.5–8.1)

## 2018-03-18 LAB — I-STAT CG4 LACTIC ACID, ED
LACTIC ACID, VENOUS: 2.18 mmol/L — AB (ref 0.5–1.9)
Lactic Acid, Venous: 1.52 mmol/L (ref 0.5–1.9)

## 2018-03-18 LAB — LIPASE, BLOOD: Lipase: 28 U/L (ref 11–51)

## 2018-03-18 LAB — SAMPLE TO BLOOD BANK

## 2018-03-18 LAB — PROTIME-INR
INR: 1.13
Prothrombin Time: 14.4 seconds (ref 11.4–15.2)

## 2018-03-18 MED ORDER — SODIUM CHLORIDE 0.9 % IV SOLN
INTRAVENOUS | Status: DC
  Administered 2018-03-18 – 2018-03-19 (×3): via INTRAVENOUS

## 2018-03-18 MED ORDER — SODIUM CHLORIDE 0.9 % IV SOLN: 1.0000 g | INTRAVENOUS | Status: DC

## 2018-03-18 MED ORDER — DIVALPROEX SODIUM 250 MG PO DR TAB
1000.0000 mg | DELAYED_RELEASE_TABLET | Freq: Every day | ORAL | Status: DC
  Administered 2018-03-18 – 2018-03-24 (×7): 1000 mg via ORAL
  Filled 2018-03-18: qty 2
  Filled 2018-03-18 (×5): qty 4
  Filled 2018-03-18: qty 2
  Filled 2018-03-18: qty 4

## 2018-03-18 MED ORDER — ATORVASTATIN CALCIUM 80 MG PO TABS
80.0000 mg | ORAL_TABLET | Freq: Every day | ORAL | Status: DC
  Administered 2018-03-18 – 2018-03-24 (×7): 80 mg via ORAL
  Filled 2018-03-18 (×9): qty 1

## 2018-03-18 MED ORDER — SODIUM CHLORIDE 0.9 % IV BOLUS (SEPSIS)
1500.0000 mL | Freq: Once | INTRAVENOUS | Status: AC
  Administered 2018-03-18: 1500 mL via INTRAVENOUS

## 2018-03-18 MED ORDER — SODIUM CHLORIDE 0.9 % IV SOLN
1.0000 g | Freq: Once | INTRAVENOUS | Status: AC
  Administered 2018-03-18: 1 g via INTRAVENOUS
  Filled 2018-03-18: qty 10

## 2018-03-18 MED ORDER — SODIUM CHLORIDE 0.9 % IV SOLN
500.0000 mg | Freq: Once | INTRAVENOUS | Status: AC
  Administered 2018-03-18: 500 mg via INTRAVENOUS
  Filled 2018-03-18: qty 500

## 2018-03-18 MED ORDER — ALLOPURINOL 300 MG PO TABS
450.0000 mg | ORAL_TABLET | Freq: Every day | ORAL | Status: DC
  Administered 2018-03-18 – 2018-03-25 (×7): 450 mg via ORAL
  Filled 2018-03-18: qty 1
  Filled 2018-03-18 (×7): qty 2

## 2018-03-18 MED ORDER — ACETAMINOPHEN 325 MG PO TABS
650.0000 mg | ORAL_TABLET | Freq: Four times a day (QID) | ORAL | Status: DC | PRN
  Administered 2018-03-19 (×2): 650 mg via ORAL
  Filled 2018-03-18 (×2): qty 2

## 2018-03-18 MED ORDER — SODIUM CHLORIDE 0.9 % IV SOLN
500.0000 mg | INTRAVENOUS | Status: DC
  Administered 2018-03-19: 500 mg via INTRAVENOUS
  Filled 2018-03-18: qty 500

## 2018-03-18 MED ORDER — ACETAMINOPHEN 650 MG RE SUPP: 650.0000 mg | Freq: Four times a day (QID) | RECTAL | Status: DC | PRN

## 2018-03-18 MED ORDER — EZETIMIBE 10 MG PO TABS
10.0000 mg | ORAL_TABLET | Freq: Every day | ORAL | Status: DC
  Administered 2018-03-18 – 2018-03-25 (×7): 10 mg via ORAL
  Filled 2018-03-18 (×8): qty 1

## 2018-03-18 MED ORDER — SODIUM CHLORIDE 0.9 % IV BOLUS
1000.0000 mL | Freq: Once | INTRAVENOUS | Status: AC
  Administered 2018-03-18: 1000 mL via INTRAVENOUS

## 2018-03-18 MED ORDER — SODIUM CHLORIDE 0.9 % IV SOLN
3.0000 g | Freq: Two times a day (BID) | INTRAVENOUS | Status: DC
  Administered 2018-03-18 – 2018-03-19 (×2): 3 g via INTRAVENOUS
  Filled 2018-03-18 (×2): qty 3

## 2018-03-18 MED ORDER — SODIUM CHLORIDE 0.9 % IV SOLN
1000.0000 mL | INTRAVENOUS | Status: DC
  Administered 2018-03-18: 1000 mL via INTRAVENOUS

## 2018-03-18 MED ORDER — PANTOPRAZOLE SODIUM 40 MG IV SOLR
40.0000 mg | Freq: Two times a day (BID) | INTRAVENOUS | Status: DC
  Administered 2018-03-19: 40 mg via INTRAVENOUS
  Filled 2018-03-18 (×2): qty 40

## 2018-03-18 MED ORDER — ONDANSETRON HCL 4 MG/2ML IJ SOLN: 4.0000 mg | Freq: Three times a day (TID) | INTRAMUSCULAR | Status: DC | PRN

## 2018-03-18 MED ORDER — FENOFIBRATE 160 MG PO TABS
160.0000 mg | ORAL_TABLET | Freq: Every day | ORAL | Status: DC
  Administered 2018-03-18 – 2018-03-25 (×7): 160 mg via ORAL
  Filled 2018-03-18 (×8): qty 1

## 2018-03-18 MED ORDER — SODIUM CHLORIDE 0.9 % IV BOLUS (SEPSIS)
1000.0000 mL | Freq: Once | INTRAVENOUS | Status: AC
  Administered 2018-03-18: 1000 mL via INTRAVENOUS

## 2018-03-18 NOTE — ED Notes (Signed)
Noted dry blood from patient's left ear. When asked, patient states that he was scrapping his ear with a "bobbi-pin and blood came out"

## 2018-03-18 NOTE — ED Notes (Signed)
Patient's family states that they will take his meds home with them.

## 2018-03-18 NOTE — ED Notes (Signed)
Patient received PO fluid. MD aware

## 2018-03-18 NOTE — ED Notes (Signed)
Admitting provider notified of BP- Bolus administered per order.  Will continue to re-assess

## 2018-03-18 NOTE — ED Notes (Signed)
Paged admitting/IntMed to BoqueronKehinde, RN @ [phone]

## 2018-03-18 NOTE — Progress Notes (Signed)
Shelly FlattenRoger T Moxey is a 62 y.o. male patient admitted from ED awake, alert - oriented  X 4 - no acute distress noted.  VSS - Blood pressure 112/78, pulse 87, temperature 100.1 F (37.8 C), temperature source Oral, resp. rate 20, height 5' 9.5" (1.765 m), weight 80.7 kg (178 lb), SpO2 93 %.    IV in place, occlusive dsg intact without redness.  Orientation to room, and floor completed with information packet given to patient/family.  Patient declined safety video at this time.  Admission INP armband ID verified with patient/family, and in place.   SR up x 2, fall assessment complete, with patient and family able to verbalize understanding of risk associated with falls, and verbalized understanding to call nsg before up out of bed.  Call light within reach, patient able to voice, and demonstrate understanding.  Skin, clean-dry- intact without evidence of bruising, or skin tears.   No evidence of skin break down noted on exam.     Will cont to eval and treat per MD orders.  Eligah EastErin M Derricka Mertz, RN 03/18/2018 6:47 PM

## 2018-03-18 NOTE — H&P (Signed)
Date: 03/18/2018               Patient Name:  [name] MRN: [id number]  DOB: June 12, 1956 Age / Sex: 62 y.o., male   PCP: Patient, No Pcp Per         Medical Service: Internal Medicine Teaching Service         Attending Physician: Dr. Mikey Bussing, Marthenia Rolling, DO    First Contact: Dr. Evelene Croon Pager: [phone]  Second Contact: Dr. Samuella Cota Pager: [phone]       After Hours (After 5p/  First Contact Pager: [phone]  weekends / holidays): Second Contact Pager: [phone]   Chief Complaint: nausea  History of Present Illness:  [name] is a 62 yo male with PMH of HFrEF (35-40%), G2DD, CAD s/p CABG and PCI, bipolar disorder, chronic shoulder pain, presenting to [facility] with 1 day history of N/V.  Patient stated that N/V began this morning with 6 episodes of emesis, appearing dark brown. He denies abd pain, melena, hematochezia.  Patient takes ibuprofen for shoulder pain (2-4 tabs per day); he is also on aspirin and plavix for CAD and PAD.   He also has shortness of breath that started yesterday morning with one episode of chest pain relieved with nitro; denies fevers, chills, cough. He denies sick contacts.  Patient is on chronic oxycodone for shoulder pain which he is prescribed by the [place]; he states that at times if he takes extra and runs out of pills early prior to refill being available, he will buy pain pills from the street. This occurs about every 2 months and occurred this month. He endorses taking one of these pills last night. Patient also endorses not remembering or being aware of his father calling EMS - he is unable to describe the events of this morning other than his nausea and vomiting.  Meds:  Current Meds  Medication Sig  . acetaminophen (TYLENOL) 500 MG tablet Take 500 mg by mouth every 6 (six) hours as needed.  Marland Kitchen albuterol (PROVENTIL HFA;VENTOLIN HFA) 108 (90 BASE) MCG/ACT inhaler Inhale 2 puffs into the lungs every 6 (six) hours as needed. WHEEZING  . allopurinol (ZYLOPRIM)  100 MG tablet Take 450 mg by mouth daily.   Marland Kitchen aspirin EC 81 MG tablet Take 81 mg by mouth daily.  Marland Kitchen atorvastatin (LIPITOR) 80 MG tablet Take 80 mg by mouth at bedtime.   . clopidogrel (PLAVIX) 75 MG tablet Take 75 mg by mouth daily.  . divalproex (DEPAKOTE) 500 MG DR tablet Take 1,000 mg by mouth at bedtime.   Marland Kitchen ezetimibe (ZETIA) 10 MG tablet Take 10 mg by mouth daily.  . fenofibrate (TRICOR) 145 MG tablet Take 145 mg by mouth daily.   . isosorbide mononitrate (IMDUR) 30 MG 24 hr tablet Take 30 mg by mouth daily.  Marland Kitchen losartan (COZAAR) 50 MG tablet Take 50 mg by mouth daily.  . metoprolol (LOPRESSOR) 25 MG tablet Take 1 tablet (25 mg total) by mouth 2 (two) times daily. (Patient taking differently: Take 300 mg by mouth daily. )  . nitroGLYCERIN (NITROSTAT) 0.4 MG SL tablet Place 0.4 mg under the tongue every 5 (five) minutes as needed for chest pain. Repeat every 5 minutes up to 3 doses.  If no relief seek medical help (Do not take Cialis, Viagra or Levitra while you are on this medication)  . Omega-3 Fatty Acids (FISH OIL) 1000 MG CAPS Take 2,000 mg by mouth 3 (three) times daily.   Marland Kitchen  oxyCODONE (OXY IR/ROXICODONE) 5 MG immediate release tablet Take 5 mg by mouth every 6 (six) hours as needed for severe pain.  . pantoprazole (PROTONIX) 40 MG tablet Take 40 mg by mouth 2 (two) times daily.   Allergies: Allergies as of 03/18/2018 - Review Complete 03/18/2018  Allergen Reaction Noted  . Prednisone Other (See Comments) 12/20/2011  . Lisinopril Cough 03/26/2017   Past Medical History:  Diagnosis Date  . Acute myocardial infarction of other inferior wall, initial episode of care   . Alcohol abuse   . Anxiety   . Arthritis   . Barrett's esophagus   . Bipolar 2 disorder (HCC)   . CAD (coronary artery disease)   . CHF (congestive heart failure) (HCC)   . Coronary artery disease   . Depression   . Diverticulosis   . GERD (gastroesophageal reflux disease)   . Gout   . Hyperlipemia   .  Hypertension   . MI (myocardial infarction) (HCC)   . PAD (peripheral artery disease) (HCC)    Moderate LE atherosclerosis.  . Pneumonia   . S/P right coronary artery (RCA) stent placement 03/04/2012    Family History: Mother and brother with IPF  Social History: Patient smokes 1ppd, sober from EtOH 41yrs ago, occasional buys pain pills from the street, denies IVDU  Review of Systems: A complete ROS was negative except as per HPI.   Physical Exam: Blood pressure (!) 151/121, pulse (!) 44, temperature 99.9 F (37.7 C), temperature source Oral, resp. rate (!) 21, height 5' 9.5" (1.765 m), weight 178 lb (80.7 kg), SpO2 90 %. GENERAL- alert, co-operative, appears as stated age, not in any distress. HEENT- Atraumatic, normocephalic, PERRL, EOMI, oral mucosa appears dry with dry vomitus  CARDIAC- RRR, no murmurs, rubs or gallops. RESP- Moving equal volumes of air; bil rales R>L without wheezing or increased work of breathing. ABDOMEN- Soft, nontender, bowel sounds present. NEURO- CN 2-12 grossly intact. EXTREMITIES- pulse 2+ radial and DP, symmetric, trace pitting edema in bil LEs SKIN- Warm, dry PSYCH- Normal mood and affect, appropriate thought content and speech.  EKG: personally reviewed my interpretation is SR, biphasic TW in II, III, aVF, V4-6 - largely  Unchanged from previous  CXR: personally reviewed my interpretation is RUL infiltrates, w/o effusions  Assessment & Plan by Problem: Principal Problem:   Pneumonia Active Problems:   CAD, CABG X 27 March 2008   AKI (acute kidney injury) (HCC)   Chronic systolic CHF (congestive heart failure) (HCC)   Nausea and vomiting  Pneumonia, presumed aspiration: Patient with ingestion of unknown pain medicine, sedation on exam and questionable recall of events earlier in the day with nausea, vomiting and CXR with right sided infiltrates consistent with pneumonia. On [place]in 5L and in no respiratory distress. Hypotension is resolved. --s/p  ceftriaxone and azithro in ED --start unasyn  --oxygen to maintain sat >89% --BCx pending  AKI likely 2/2 volume depletion: Patient with Cr 2.41 on admission with previous b/l of 1 in setting of vomiting; initially he was hypotensive on arrival to ED which improved with fluids.  --IVF --f/u bmet in AM  N/V: Patient with 1d h/o nausea and vomiting with reported "dark brown" emesis concerning for GI bleed in setting h/o severe gastritis (EGD 2005), h/o Barrett's esophagus and reported h/o GI bleed, use of ASA and plavix for CAD and regular use of ibuprofen for pain. Denies melena or hematochezia. Nausea and vomiting have resolved with fluid administration by time of evaluation. Hgb was stable  at 10.0. --IV PPI BID --clear liq diet --zofran PRN --hold asa, plavix, ibuprofen  CAD s/p CABG and RCA DES 2013 and 07/2017 PAD s/p femoral artery stent at unknown time: No CP. In setting of possible coffee ground emesis will hold asa and plavix. --hold asa and plavix --continue atorva 80mg  daily, ezetimibe 10mg  daily  HFrEF (EF 35-40%), G2DD: Hypotensive on arrival which improved with IVF. Mucous membranes appeared dry however he did have mild pitting edema in bil LEs. He received 3+L IVF in ED. --hold home imdur 30mg  daily, metoprolol 25mg  BID, losartan 50mg  daily --daily weight, strict ins/outs  Bipolar disorder: Continue home divalproex 1000mg  qhs  Diet: clears CODE: DNR - discussed with patient and his wishes are consistent with DNR status  Dispo: Admit patient to Observation with expected length of stay less than 2 midnights.  Signed: Nyra MarketSvalina, Garhett Bernhard, MD 03/18/2018, 10:29 PM  Pager: [phone]

## 2018-03-18 NOTE — ED Provider Notes (Addendum)
[facility] Provider Note   CSN: [id number] Arrival date & time: 03/18/18  1240     History   Chief Complaint Chief Complaint  Patient presents with  . Weakness    HPI [name] is a 62 y.o. male.  HPI Patient presents to the emergency room for evaluation of weakness and confusion.  Patient states he has been having trouble with nausea, vomiting and weakness.  The vomiting has been intermittent and occasionally has been dark in color. Patient states his father whom he lives with called EMS.  Patient has been coughing and has been congested.  He denies any trouble with chest pain.  He denies any trouble with abdominal pain.  He denies any trouble with headache.  Initially EMS indicated the patient had been drinking alcohol this morning.  He denies that.  He does admit to taking some pain medications that he has for his shoulder.  He also injured his left ear when he used a Bobby pin to scratch the inside of his ear. Past Medical History:  Diagnosis Date  . Acute myocardial infarction of other inferior wall, initial episode of care   . Alcohol abuse   . Anxiety   . Arthritis   . Barrett's esophagus   . Bipolar 2 disorder (HCC)   . CAD (coronary artery disease)   . CHF (congestive heart failure) (HCC)   . Coronary artery disease   . Depression   . Diverticulosis   . GERD (gastroesophageal reflux disease)   . Gout   . Hyperlipemia   . Hypertension   . MI (myocardial infarction) (HCC)   . PAD (peripheral artery disease) (HCC)    Moderate LE atherosclerosis.  . Pneumonia   . S/P right coronary artery (RCA) stent placement 03/04/2012    Patient Active Problem List   Diagnosis Date Noted  . Barretts esophagus 10/02/2017  . S/P drug eluting coronary stent placement 10/02/2017  . History of colonic polyps 10/02/2017  . Acute renal failure (HCC)   . Chronic systolic CHF (congestive heart failure) (HCC)   . AKI (acute kidney injury)  (HCC) 03/27/2017  . Chest pain   . Orthostatic hypotension 03/26/2017  . S/P angioplasty with stent SVG-RCA 3/13 (left AMA without Plavix), and again SVG-RCA BMS 10/09/13 10/10/2013  . Hypertriglyceridemia 10/09/2013  . Acute myocardial infarction of other inferior wall, initial episode of care   . STEMI (ST elevation myocardial infarction) (HCC) 03/04/2012  . CAD, CABG X 27 March 2008 03/04/2012  . Bipolar 1 disorder (HCC) 03/04/2012  . Homeless 03/04/2012  . Noncompliance with diet and medication regimen 03/04/2012  . Smoker 03/04/2012  . [facility] 03/04/2012  . Ischemic cardiomyopathy, EF 40% 2009 03/04/2012  . CAD - Inf STEMI in 2013 & now in 09/2013 03/04/2012  . HEPATITIS C 02/20/2007  . HYPERLIPIDEMIA 02/20/2007  . GOUT, UNSPECIFIED 02/20/2007  . TOBACCO DEPENDENCE 02/20/2007  . HERNIA, HIATAL, NONCONGENITAL 02/20/2007  . GASTROINTESTINAL HEMORRHAGE 02/20/2007  . ARTHRITIS 02/20/2007    Past Surgical History:  Procedure Laterality Date  . CHOLECYSTECTOMY    . CORONARY ANGIOPLASTY    . CORONARY ARTERY BYPASS GRAFT    . CORONARY STENT PLACEMENT    . FEMORAL-POPLITEAL BYPASS GRAFT    . LEFT HEART CATHETERIZATION WITH CORONARY ANGIOGRAM N/A 03/04/2012   Procedure: LEFT HEART CATHETERIZATION WITH CORONARY ANGIOGRAM;  Surgeon: Runell GessJonathan J Berry, MD;  [facility];  Service: Cardiovascular;  Laterality:  N/A;  . LEFT HEART CATHETERIZATION WITH CORONARY ANGIOGRAM N/A 10/07/2013   Procedure: LEFT HEART CATHETERIZATION WITH CORONARY ANGIOGRAM;  Surgeon: Corky Crafts, MD;  Location: [facility] CATH LAB;  Service: Cardiovascular;  Laterality: N/A;  . TOE SURGERY          Home Medications    Prior to Admission medications   Medication Sig Start Date End Date Taking? Authorizing Provider  acetaminophen (TYLENOL) 500 MG tablet Take 500 mg by mouth every 6 (six) hours as needed.   Yes [provider]  albuterol (PROVENTIL HFA;VENTOLIN HFA)  108 (90 BASE) MCG/ACT inhaler Inhale 2 puffs into the lungs every 6 (six) hours as needed. WHEEZING   Yes [provider]  allopurinol (ZYLOPRIM) 100 MG tablet Take 450 mg by mouth daily.    Yes [provider]  aspirin EC 81 MG tablet Take 81 mg by mouth daily.   Yes [provider]  atorvastatin (LIPITOR) 80 MG tablet Take 80 mg by mouth at bedtime.    Yes [provider]  clopidogrel (PLAVIX) 75 MG tablet Take 75 mg by mouth daily.   Yes [provider]  divalproex (DEPAKOTE) 500 MG DR tablet Take 1,000 mg by mouth at bedtime.    Yes [provider]  ezetimibe (ZETIA) 10 MG tablet Take 10 mg by mouth daily.   Yes [provider]  fenofibrate (TRICOR) 145 MG tablet Take 145 mg by mouth daily.    Yes [provider]  isosorbide mononitrate (IMDUR) 30 MG 24 hr tablet Take 30 mg by mouth daily.   Yes [provider]  losartan (COZAAR) 50 MG tablet Take 50 mg by mouth daily.   Yes [provider]  metoprolol (LOPRESSOR) 25 MG tablet Take 1 tablet (25 mg total) by mouth 2 (two) times daily. Patient taking differently: Take 300 mg by mouth daily.  03/28/17  Yes Vassie Loll, MD  nitroGLYCERIN (NITROSTAT) 0.4 MG SL tablet Place 0.4 mg under the tongue every 5 (five) minutes as needed for chest pain. Repeat every 5 minutes up to 3 doses.  If no relief seek medical help (Do not take Cialis, Viagra or Levitra while you are on this medication)   Yes [provider]  Omega-3 Fatty Acids (FISH OIL) 1000 MG CAPS Take 2,000 mg by mouth 3 (three) times daily.    Yes [provider]  oxyCODONE (OXY IR/ROXICODONE) 5 MG immediate release tablet Take 5 mg by mouth every 6 (six) hours as needed for severe pain.   Yes [provider]  pantoprazole (PROTONIX) 40 MG tablet Take 40 mg by mouth 2 (two) times daily.   Yes [provider]    Family History Family History  Problem Relation Age of  Onset  . Heart disease Mother   . Heart disease Father   . Colon cancer Paternal Grandfather     Social History Social History   Tobacco Use  . Smoking status: Current Every Day Smoker    Packs/day: 0.50    Types: Cigarettes  . Smokeless tobacco: Never Used  . Tobacco comment: Uses Nicotene patch  Substance Use Topics  . Alcohol use: No    Comment: former drinker stopped 6 years ago   . Drug use: No    Comment: Former Marijuana user of 4 years     Allergies   Prednisone and Lisinopril   Review of Systems Review of Systems  All other systems reviewed and are negative.    Physical  Exam Updated Vital Signs BP 114/61   Pulse 86   Temp 99.8 F (37.7 C) (Oral)   Resp 11   Ht 1.765 m (5' 9.5")   Wt 80.7 kg (178 lb)   SpO2 97%   BMI 25.91 kg/m   Physical Exam  HENT:  Head: Normocephalic and atraumatic.  Right Ear: External ear normal.  Left Ear: External ear normal.  Blood noted in left EAC, unable to assess tm  Eyes: Conjunctivae are normal. Right eye exhibits no discharge. Left eye exhibits no discharge. No scleral icterus.  Neck: Neck supple. No tracheal deviation present.  Cardiovascular: Normal rate, regular rhythm and intact distal pulses.  Pulmonary/Chest: Effort normal and breath sounds normal. No stridor. No respiratory distress. He has no wheezes. He has no rales.  Abdominal: Soft. Bowel sounds are normal. He exhibits no distension. There is no tenderness. There is no rebound and no guarding.  Musculoskeletal: He exhibits no edema or tenderness.  Neurological: He is alert. He has normal strength. No cranial nerve deficit (no facial droop, extraocular movements intact, no slurred speech) or sensory deficit. He exhibits normal muscle tone. He displays no seizure activity. Coordination normal.  Skin: Skin is warm and dry. No rash noted. He is not diaphoretic.  Psychiatric: He has a normal mood and affect.  Nursing note and vitals reviewed.    ED  Treatments / Results  Labs (all labs ordered are listed, but only abnormal results are displayed) Labs Reviewed  CBC WITH DIFFERENTIAL/PLATELET - Abnormal; Notable for the following components:      Result Value   WBC 3.0 (*)    RBC 3.74 (*)    Hemoglobin 10.0 (*)    HCT 31.7 (*)    RDW 16.3 (*)    All other components within normal limits  COMPREHENSIVE METABOLIC PANEL - Abnormal; Notable for the following components:   CO2 20 (*)    BUN 21 (*)    Creatinine, Ser 2.41 (*)    Total Protein 6.4 (*)    ALT 11 (*)    GFR calc non Af Amer 27 (*)    GFR calc Af Amer 31 (*)    All other components within normal limits  I-STAT CG4 LACTIC ACID, ED - Abnormal; Notable for the following components:   Lactic Acid, Venous 2.18 (*)    All other components within normal limits  CULTURE, BLOOD (ROUTINE X 2)  CULTURE, BLOOD (ROUTINE X 2)  URINE CULTURE  LIPASE, BLOOD  PROTIME-INR  URINALYSIS, ROUTINE W REFLEX MICROSCOPIC  ETHANOL  I-STAT CG4 LACTIC ACID, ED  SAMPLE TO BLOOD BANK    EKG EKG Interpretation  Date/Time:  Tuesday March 18 2018 12:52:02 EDT Ventricular Rate:  83 PR Interval:    QRS Duration: 119 QT Interval:  371 QTC Calculation: 436 R Axis:   73 Text Interpretation:  Sinus rhythm Atrial premature complex Nonspecific intraventricular conduction delay Nonspecific repol abnormality, diffuse leads No significant change since last tracing Confirmed by Linwood Dibbles [phone]) on 03/18/2018 1:08:30 PM   Radiology Dg Chest 2 View  Result Date: 03/18/2018 CLINICAL DATA:  Weakness, cough EXAM: CHEST - 2 VIEW COMPARISON:  10/07/2013 FINDINGS: Right upper lobe and lower lobe interstitial and alveolar airspace opacities. No pleural effusion or pneumothorax. Stable cardiomediastinal silhouette. Prior CABG. No acute osseous abnormality. Mild osteoarthritis of the right glenohumeral joint. IMPRESSION: Right upper lobe and lower lobe interstitial and alveolar airspace opacities which may  reflect multi lobar pneumonia versus asymmetric pulmonary edema. Electronically  Signed   By: Elige Ko   On: 03/18/2018 14:14    Procedures .Critical Care Performed by: Linwood Dibbles, MD Authorized by: Linwood Dibbles, MD   Critical care provider statement:    Critical care time (minutes):  35   Critical care was time spent personally by me on the following activities:  Discussions with consultants, evaluation of patient's response to treatment, examination of patient, ordering and performing treatments and interventions, ordering and review of laboratory studies, ordering and review of radiographic studies, pulse oximetry, re-evaluation of patient's condition, obtaining history from patient or surrogate and review of old charts   (including critical care time)  Medications Ordered in ED Medications  sodium chloride 0.9 % bolus 1,000 mL (0 mLs Intravenous Stopped 03/18/18 1404)    Followed by  0.9 %  sodium chloride infusion (1,000 mLs Intravenous New Bag/Given 03/18/18 1400)  sodium chloride 0.9 % bolus 1,500 mL (1,500 mLs Intravenous New Bag/Given 03/18/18 1436)  azithromycin (ZITHROMAX) 500 mg in sodium chloride 0.9 % 250 mL IVPB (500 mg Intravenous New Bag/Given 03/18/18 1442)  azithromycin (ZITHROMAX) 500 mg in sodium chloride 0.9 % 250 mL IVPB (has no administration in time range)  cefTRIAXone (ROCEPHIN) 1 g in sodium chloride 0.9 % 100 mL IVPB (has no administration in time range)  cefTRIAXone (ROCEPHIN) 1 g in sodium chloride 0.9 % 100 mL IVPB (1 g Intravenous New Bag/Given 03/18/18 1442)     Initial Impression / Assessment and Plan / ED Course  I have reviewed the triage vital signs and the nursing notes.  Pertinent labs & imaging results that were available during my care of the patient were reviewed by me and considered in my medical decision making (see chart for details).  Clinical Course as of Mar 18 1528  Tue Mar 18, 2018  1526 Anemia appears stable  CBC with Differential(!)  [JK]  1527 BUN and creatinine are elevated consistent with acute kidney injury  Comprehensive metabolic panel(!) [JK]  1527 Elevated lactic acid level concerning for sepsis.  I-Stat CG4 Lactic Acid, ED(!!) [JK]  1528 Blood pressure is improving.  Patient appears to be responding to treatment.  Most recent blood pressure is 114/61 the patient is not tachycardic.  Oxygen saturation is normal   [JK]    Clinical Course User Index [JK] Linwood Dibbles, MD    Patient presented to the emergency room for evaluation of weakness, and vomiting.  Patient also has been coughing.  His chest x-ray suggest the possibility of pneumonia.  He does have an elevated lactic acid level.  Patient presented hypotensive initially.  This may be a combination of sepsis as well as dehydration from his vomiting.  Patient has responded to IV fluid hydration as well as antibiotics.  Most recent blood pressure is 114/61.  I will consult the medical service to arrange for admission and further treatment.  Patient does not appear to require ICU admission at this time.  Final Clinical Impressions(s) / ED Diagnoses   Final diagnoses:  Community acquired pneumonia, unspecified laterality  Sepsis, due to unspecified organism [facility])  AKI (acute kidney injury) (HCC)      Linwood Dibbles, MD 03/18/18 1529 Chart addendum   Linwood Dibbles, MD 03/31/18 [phone]

## 2018-03-18 NOTE — Telephone Encounter (Signed)
Patient is currently admitted - will hold and follow.

## 2018-03-18 NOTE — ED Triage Notes (Signed)
Pt arrives EMS from home with c/o vomiting"black stuff". Started vomiting this morning. Denies nausea or abdominal pain or diarrhea. States he vomitted 9-10 times . Pt states he took "strong pain medications " that he obtained from street because he ran out of his oxycodone too early. Pt appears to fall asleep between questions.Pt taking plavix from stent placed SEptember. Hx gi bleed.

## 2018-03-19 DIAGNOSIS — Z955 Presence of coronary angioplasty implant and graft: Secondary | ICD-10-CM | POA: Diagnosis not present

## 2018-03-19 DIAGNOSIS — I110 Hypertensive heart disease with heart failure: Secondary | ICD-10-CM | POA: Diagnosis present

## 2018-03-19 DIAGNOSIS — I481 Persistent atrial fibrillation: Secondary | ICD-10-CM | POA: Diagnosis not present

## 2018-03-19 DIAGNOSIS — Z8249 Family history of ischemic heart disease and other diseases of the circulatory system: Secondary | ICD-10-CM | POA: Diagnosis not present

## 2018-03-19 DIAGNOSIS — Z66 Do not resuscitate: Secondary | ICD-10-CM

## 2018-03-19 DIAGNOSIS — I25810 Atherosclerosis of coronary artery bypass graft(s) without angina pectoris: Secondary | ICD-10-CM | POA: Diagnosis present

## 2018-03-19 DIAGNOSIS — I5022 Chronic systolic (congestive) heart failure: Secondary | ICD-10-CM | POA: Diagnosis not present

## 2018-03-19 DIAGNOSIS — I480 Paroxysmal atrial fibrillation: Secondary | ICD-10-CM | POA: Diagnosis present

## 2018-03-19 DIAGNOSIS — F17210 Nicotine dependence, cigarettes, uncomplicated: Secondary | ICD-10-CM | POA: Diagnosis present

## 2018-03-19 DIAGNOSIS — I5043 Acute on chronic combined systolic (congestive) and diastolic (congestive) heart failure: Secondary | ICD-10-CM | POA: Diagnosis present

## 2018-03-19 DIAGNOSIS — I70209 Unspecified atherosclerosis of native arteries of extremities, unspecified extremity: Secondary | ICD-10-CM | POA: Diagnosis present

## 2018-03-19 DIAGNOSIS — Z8719 Personal history of other diseases of the digestive system: Secondary | ICD-10-CM

## 2018-03-19 DIAGNOSIS — Q231 Congenital insufficiency of aortic valve: Secondary | ICD-10-CM | POA: Diagnosis not present

## 2018-03-19 DIAGNOSIS — Z7902 Long term (current) use of antithrombotics/antiplatelets: Secondary | ICD-10-CM

## 2018-03-19 DIAGNOSIS — K920 Hematemesis: Secondary | ICD-10-CM | POA: Diagnosis present

## 2018-03-19 DIAGNOSIS — F3181 Bipolar II disorder: Secondary | ICD-10-CM | POA: Diagnosis present

## 2018-03-19 DIAGNOSIS — A419 Sepsis, unspecified organism: Secondary | ICD-10-CM | POA: Diagnosis present

## 2018-03-19 DIAGNOSIS — J690 Pneumonitis due to inhalation of food and vomit: Secondary | ICD-10-CM | POA: Diagnosis present

## 2018-03-19 DIAGNOSIS — M25511 Pain in right shoulder: Secondary | ICD-10-CM

## 2018-03-19 DIAGNOSIS — I252 Old myocardial infarction: Secondary | ICD-10-CM | POA: Diagnosis not present

## 2018-03-19 DIAGNOSIS — Z79891 Long term (current) use of opiate analgesic: Secondary | ICD-10-CM

## 2018-03-19 DIAGNOSIS — Z95820 Peripheral vascular angioplasty status with implants and grafts: Secondary | ICD-10-CM | POA: Diagnosis not present

## 2018-03-19 DIAGNOSIS — Z7982 Long term (current) use of aspirin: Secondary | ICD-10-CM

## 2018-03-19 DIAGNOSIS — E785 Hyperlipidemia, unspecified: Secondary | ICD-10-CM | POA: Diagnosis present

## 2018-03-19 DIAGNOSIS — Z791 Long term (current) use of non-steroidal anti-inflammatories (NSAID): Secondary | ICD-10-CM

## 2018-03-19 DIAGNOSIS — N179 Acute kidney failure, unspecified: Secondary | ICD-10-CM | POA: Diagnosis present

## 2018-03-19 DIAGNOSIS — J189 Pneumonia, unspecified organism: Secondary | ICD-10-CM

## 2018-03-19 DIAGNOSIS — G8929 Other chronic pain: Secondary | ICD-10-CM | POA: Diagnosis present

## 2018-03-19 DIAGNOSIS — I739 Peripheral vascular disease, unspecified: Secondary | ICD-10-CM

## 2018-03-19 DIAGNOSIS — K2270 Barrett's esophagus without dysplasia: Secondary | ICD-10-CM | POA: Diagnosis present

## 2018-03-19 DIAGNOSIS — K449 Diaphragmatic hernia without obstruction or gangrene: Secondary | ICD-10-CM | POA: Diagnosis present

## 2018-03-19 DIAGNOSIS — Z951 Presence of aortocoronary bypass graft: Secondary | ICD-10-CM | POA: Diagnosis not present

## 2018-03-19 DIAGNOSIS — D649 Anemia, unspecified: Secondary | ICD-10-CM | POA: Diagnosis present

## 2018-03-19 DIAGNOSIS — Z79899 Other long term (current) drug therapy: Secondary | ICD-10-CM

## 2018-03-19 DIAGNOSIS — F319 Bipolar disorder, unspecified: Secondary | ICD-10-CM

## 2018-03-19 DIAGNOSIS — I2510 Atherosclerotic heart disease of native coronary artery without angina pectoris: Secondary | ICD-10-CM | POA: Diagnosis not present

## 2018-03-19 DIAGNOSIS — I214 Non-ST elevation (NSTEMI) myocardial infarction: Secondary | ICD-10-CM | POA: Diagnosis not present

## 2018-03-19 LAB — BASIC METABOLIC PANEL
Anion gap: 13 (ref 5–15)
BUN: 26 mg/dL — AB (ref 6–20)
CO2: 16 mmol/L — ABNORMAL LOW (ref 22–32)
CREATININE: 1.32 mg/dL — AB (ref 0.61–1.24)
Calcium: 8 mg/dL — ABNORMAL LOW (ref 8.9–10.3)
Chloride: 105 mmol/L (ref 101–111)
GFR, EST NON AFRICAN AMERICAN: 56 mL/min — AB (ref >60)
Glucose, Bld: 122 mg/dL — ABNORMAL HIGH (ref 65–99)
Potassium: 4.3 mmol/L (ref 3.5–5.1)
SODIUM: 134 mmol/L — AB (ref 135–145)

## 2018-03-19 LAB — CBC
HCT: 28.9 % — ABNORMAL LOW (ref 39.0–52.0)
Hemoglobin: 9.3 g/dL — ABNORMAL LOW (ref 13.0–17.0)
MCH: 27.3 pg (ref 26.0–34.0)
MCHC: 32.2 g/dL (ref 30.0–36.0)
MCV: 84.8 fL (ref 78.0–100.0)
PLATELETS: 122 10*3/uL — AB (ref 150–400)
RBC: 3.41 MIL/uL — ABNORMAL LOW (ref 4.22–5.81)
RDW: 16.6 % — ABNORMAL HIGH (ref 11.5–15.5)
WBC: 9.4 10*3/uL (ref 4.0–10.5)

## 2018-03-19 MED ORDER — DICLOFENAC SODIUM 1 % TD GEL
2.0000 g | Freq: Four times a day (QID) | TRANSDERMAL | Status: DC | PRN
  Administered 2018-03-19 – 2018-03-22 (×5): 2 g via TOPICAL
  Filled 2018-03-19: qty 100

## 2018-03-19 MED ORDER — IPRATROPIUM-ALBUTEROL 0.5-2.5 (3) MG/3ML IN SOLN
3.0000 mL | Freq: Three times a day (TID) | RESPIRATORY_TRACT | Status: DC | PRN
  Administered 2018-03-19 (×2): 3 mL via RESPIRATORY_TRACT
  Filled 2018-03-19 (×2): qty 3

## 2018-03-19 MED ORDER — PANTOPRAZOLE SODIUM 40 MG PO TBEC
40.0000 mg | DELAYED_RELEASE_TABLET | Freq: Two times a day (BID) | ORAL | Status: DC
  Administered 2018-03-19 – 2018-03-25 (×11): 40 mg via ORAL
  Filled 2018-03-19 (×13): qty 1

## 2018-03-19 MED ORDER — METOPROLOL TARTRATE 25 MG PO TABS: 25.0000 mg | ORAL_TABLET | Freq: Two times a day (BID) | ORAL | Status: DC

## 2018-03-19 MED ORDER — IPRATROPIUM-ALBUTEROL 0.5-2.5 (3) MG/3ML IN SOLN
3.0000 mL | Freq: Two times a day (BID) | RESPIRATORY_TRACT | Status: DC
  Administered 2018-03-19 – 2018-03-21 (×4): 3 mL via RESPIRATORY_TRACT
  Filled 2018-03-19 (×4): qty 3

## 2018-03-19 MED ORDER — OXYCODONE HCL 5 MG PO TABS
5.0000 mg | ORAL_TABLET | Freq: Two times a day (BID) | ORAL | Status: DC | PRN
  Administered 2018-03-19 – 2018-03-25 (×13): 5 mg via ORAL
  Filled 2018-03-19 (×13): qty 1

## 2018-03-19 MED ORDER — FUROSEMIDE 10 MG/ML IJ SOLN
40.0000 mg | Freq: Once | INTRAMUSCULAR | Status: AC
  Administered 2018-03-19: 40 mg via INTRAVENOUS
  Filled 2018-03-19: qty 4

## 2018-03-19 MED ORDER — METOPROLOL TARTRATE 5 MG/5ML IV SOLN: 5.0000 mg | Freq: Once | INTRAVENOUS | Status: DC

## 2018-03-19 MED ORDER — ORAL CARE MOUTH RINSE
15.0000 mL | Freq: Two times a day (BID) | OROMUCOSAL | Status: DC
  Administered 2018-03-19 – 2018-03-25 (×11): 15 mL via OROMUCOSAL

## 2018-03-19 MED ORDER — METOPROLOL TARTRATE 5 MG/5ML IV SOLN
5.0000 mg | Freq: Once | INTRAVENOUS | Status: AC
  Administered 2018-03-19: 5 mg via INTRAVENOUS
  Filled 2018-03-19: qty 5

## 2018-03-19 MED ORDER — POTASSIUM CHLORIDE CRYS ER 20 MEQ PO TBCR
40.0000 meq | EXTENDED_RELEASE_TABLET | Freq: Once | ORAL | Status: AC
Start: 2018-03-19 — End: 2018-03-19
  Administered 2018-03-19: 40 meq via ORAL
  Filled 2018-03-19: qty 2

## 2018-03-19 MED ORDER — METOPROLOL TARTRATE 5 MG/5ML IV SOLN
5.0000 mg | Freq: Four times a day (QID) | INTRAVENOUS | Status: AC | PRN
  Administered 2018-03-20: 5 mg via INTRAVENOUS
  Filled 2018-03-19: qty 5

## 2018-03-19 MED ORDER — METOPROLOL TARTRATE 25 MG PO TABS
25.0000 mg | ORAL_TABLET | Freq: Two times a day (BID) | ORAL | Status: DC
  Administered 2018-03-19: 25 mg via ORAL
  Filled 2018-03-19: qty 1

## 2018-03-19 MED ORDER — METOPROLOL TARTRATE 5 MG/5ML IV SOLN
2.5000 mg | Freq: Once | INTRAVENOUS | Status: AC
  Administered 2018-03-19: 2.5 mg via INTRAVENOUS
  Filled 2018-03-19: qty 5

## 2018-03-19 MED ORDER — IPRATROPIUM-ALBUTEROL 0.5-2.5 (3) MG/3ML IN SOLN: 3.0000 mL | Freq: Four times a day (QID) | RESPIRATORY_TRACT | Status: DC | PRN

## 2018-03-19 MED ORDER — AMOXICILLIN-POT CLAVULANATE 875-125 MG PO TABS
1.0000 | ORAL_TABLET | Freq: Two times a day (BID) | ORAL | Status: AC
  Administered 2018-03-19 – 2018-03-21 (×6): 1 via ORAL
  Filled 2018-03-19 (×6): qty 1

## 2018-03-19 NOTE — Progress Notes (Addendum)
Received call from CCMD that pt heart rate was elevated. Upon assessment, pt in afib with HR sustaining in 140's to 170's. Pt short of breath and uncomfortable, stating he was having trouble breathing. Pt O2 at 89, RR at 26. Placed pt on 2L of O2 and saturation increased to 95. Paged MD, received orders for metoprolol. Pt HR responded well to several doses of metoprolol. HR now NSR sustaining in 90's. Pt received breathing treatment from respiratory and states he is comfortable now. Will continue to monitor.

## 2018-03-19 NOTE — Progress Notes (Signed)
Subjective:  [name] was seen lying in bed this morning. He had an episode of dyspnea this morning, stated that the breathing treatment did provide some relief but was still feeling short of breath. Given a dose of IV lasix with some improvement in his dyspnea this morning. He denies current abdominal pain, states he has not had any recurrence of emesis. Tolerating PO intake. He states that he had 3 episodes of dark brown emesis prior to admission. He denies recalling any episode where he had passed out prior to hospitalization. Denies dizziness or lightheadedness. He also continues to endorse chronic right shoulder pain.  Objective:  Vital signs in last 24 hours: Vitals:   03/19/18 0218 03/19/18 0601 03/19/18 0620 03/19/18 0644  BP: 123/68  (!) 146/90   Pulse: 83  93   Resp:      Temp:      TempSrc:      SpO2:  92%  94%  Weight:      Height:       GEN: Lying in bed in NAD CV: NR & RR, no m/r/g RESP: Bibasilar rales bilaterally, no wheezing. Mild accessory muscle use on [name] ABD: Soft, nontender, nondistended, +BS EXT: Trace BLE edema PSYCH: Calm and pleasant  Assessment/Plan:  Principal Problem:   Pneumonia Active Problems:   CAD, CABG X 27 March 2008   AKI (acute kidney injury) (HCC)   Chronic systolic CHF (congestive heart failure) (HCC)   Nausea and vomiting  [name] is a 62 yo male with PMH of HFrEF (35-40%), G2DD, CAD (s/p CABG x5 in 2009, BMS SVG-RCA x2 in 2013 and 2014, and DES 2018) PAD s/p fem-pop bypass, bipolar disorder, and chronic shoulder pain who presents with ?hematemesis. Found to have right-sided infiltrates on CXR concerning for pneumonia. With history of N/V and ingestion of unknown pain medication/sedation, there is concern for aspiration pneumonia.  Pneumonia, presumed aspiration Patient with ingestion of unknown pain medicine, sedation on exam and questionable recall of events earlier in the day with nausea, vomiting and CXR with right sided  infiltrates consistent with pneumonia. Satting well on 5L [place]. Received a dose of ceftriaxone and azithromycin in the ED, which was switched to azithro and Unasyn yesterday due to concern for aspiration. Patient tolerating PO intake now and will switch to Augmentin. - D/c azithro and Unasyn - Start PO Augmentin 875-125mg  BID x7 days total (3/26 -> 4/2) - Wean to RA, as able (maintain sats >89%) - F/u BCx 3/26 -> NG x24h  AKI, likely 2/2 volume depletion Cr 2.41 on admission, improved to 1.32 with IVF (baseline ~1). In setting of N/V, etiology most likely volume depletion. IVF discontinued due to episode of dyspnea this morning. - F/u BMET - BMP in AM  N/V, resolved Hx of severe gastritis (EGD 2005), Barrett's esophagus, previous GI bleed Concerning for GI bleed with history of multiple GI issues and use of ASA and plavix for CAD and ibuprofen for pain. Denies melena or hematochezia. Nausea and vomiting had resolved with fluid administration by time of evaluation. Hgb stable at 9.3 this AM (baseline ~10-11.9) - IV protonix 40mg  BID - IV Zofran PRN - Advance to [facility] diet - Holding home ASA, plavix, and ibuprofen - CBC in AM  CAD s/p CABG and RCA BMS 2013, 2014 and DES 07/2017 PAD s/p fem-pop bypass No CP. - Holding home ASA and plavix in setting of possible hematemesis - Continue home atorvastatin 80mg  daily, ezetimibe 10mg  daily  Dyspnea HFrEF (03/2017  TTE: EF 35-40%, G2DD) He received 3+L IVF in ED and was on maintenance fluids initially due to concern for volume depletion in setting of N/V. He had an episode of increased dyspnea this morning, unrelieved by breathing treatment. +Bibasilar crackles on exam, likely now volume overloaded 2/2 IVF administration. S/p dose of IV lasix 40mg  this morning. - D/c IVF - Holding home imdur 30mg  daily, metoprolol 25mg  BID, losartan 50mg  daily - Daily weights - Strict I/Os  Bipolar disorder - Continue home divalproex 1000mg  QHS  Chronic  Shoulder Pain [facility]. Gets extra narcotics off the street when he runs out of his prescription during the month. Discussed importance of abstaining from extra narcotics due to multiple risks, including sedation and subsequent aspiration. - Tylenol PRN - Voltaren gel 2g QID  Code Status: DNR/DNI Dispo: Anticipated discharge in approximately 1-2 day(s).   Scherrie GerlachHuang, Amayiah Gosnell, MD 03/19/2018, 6:45 AM Pager: Demetrius CharityP [phone]

## 2018-03-19 NOTE — Progress Notes (Signed)
Patient complaining of difficulty breathing upon entering room, pt using accessory muscles to breathe and wheezing is audible and fine crackles in both lung bases. Paged Winfrey,MD about pt and he placed order for breathing treatment. This RN called respiratory therapist and he came to bedside and assessed patient and we gave breathing treatment. Respiratory therapist said pt would benefit from bipap but patient is nauseated so that was not an option. Rapid Response RN came to bedside after breathing treatment completed. Patient not working as hard to breath but would benefit from lasix per respiratory therapist. Winfrey,MD notified and he discontinued fluids and said he would place order for lasix after getting BP. BP is currently 146/90 with HR 93. Patient sitting on side of bed. MD notified of current BP. Awaiting orders at this time.

## 2018-03-19 NOTE — Care Management Note (Signed)
Case Management Note  Patient Details  Name: Stanley FlattenRoger T Becker MRN: [id number] Date of Birth: 27-May-1956  Subjective/Objective:   Admitted PNA. Lives with dad. Independent with ADL's. Ambulates with cane.         [facility], Dr. Sharion BalloonJohn Whited , [phone],   Action/Plan: Transition to home when medically stable. CM following for disposition needs.  Expected Discharge Date:                  Expected Discharge Plan:    In-House Referral:     Discharge planning Services  CM Consult  Post Acute Care Choice:    Choice offered to:     DME Arranged:    DME Agency:     HH Arranged:    HH Agency:     Status of Service:  In process, will continue to follow  If discussed at Long Length of Stay Meetings, dates discussed:    Additional Comments:  Epifanio LeschesCole, Myrical Andujo Hudson, RN 03/19/2018, 12:55 PM

## 2018-03-19 NOTE — Progress Notes (Signed)
Called for respiratory distress.  Pt sitting upright in bed dangling legs off side.  Per bedside RN, pt received 1 x duoneb prior to my arrival.  Pt is AAO x 4, breathing approximately 24 x min, bibasilar crackles, shallow breathing pattern.  JVD present.  BP 146/90, HR 104 ST, SpO2 94% on 6 LNC.  Pt reports improvement with the breathing treatment.  RN states primary MD placed order for 40 mg lasix.  Continuous pulse oximetry in place, pt on tele monitor.  Pt was placed on our radar and we will continue to follow.

## 2018-03-19 NOTE — Discharge Summary (Signed)
Name: [name] MRN: [id number] DOB: [birth year] 62 y.o. PCP: Patient, No Pcp Per  Date of Admission: 03/18/2018 12:40 PM Date of Discharge: 03/25/2018 Attending Physician: Dr. Earl Lagos  Discharge Diagnosis: 1. Pneumonia, presumed aspiration 2. AKI 3. Chronic combined systolic and diastolic CHF 4. Nausea and vomiting 5. Afib with RVR 6. NSTEMI   Discharge Medications: Allergies as of 03/25/2018      Reactions   Prednisone Other (See Comments)   Causes steroid rage (takes regularly at home though)   Lisinopril Cough      Medication List    STOP taking these medications   aspirin EC 81 MG tablet   fenofibrate 145 MG tablet Commonly known as:  TRICOR   isosorbide mononitrate 30 MG 24 hr tablet Commonly known as:  IMDUR   losartan 50 MG tablet Commonly known as:  COZAAR   metoprolol tartrate 25 MG tablet Commonly known as:  LOPRESSOR     TAKE these medications   acetaminophen 500 MG tablet Commonly known as:  TYLENOL Take 500 mg by mouth every 6 (six) hours as needed.   albuterol 108 (90 Base) MCG/ACT inhaler Commonly known as:  PROVENTIL HFA;VENTOLIN HFA Inhale 2 puffs into the lungs every 6 (six) hours as needed. WHEEZING   allopurinol 100 MG tablet Commonly known as:  ZYLOPRIM Take 450 mg by mouth daily.   diclofenac sodium 1 % Gel Commonly known as:  VOLTAREN Apply 2 g topically 4 (four) times daily.   divalproex 500 MG DR tablet Commonly known as:  DEPAKOTE Take 1,000 mg by mouth at bedtime.   ezetimibe 10 MG tablet Commonly known as:  ZETIA Take 10 mg by mouth daily.   Fish Oil 1000 MG Caps Take 2,000 mg by mouth 3 (three) times daily.   nitroGLYCERIN 0.4 MG SL tablet Commonly known as:  NITROSTAT Place 0.4 mg under the tongue every 5 (five) minutes as needed for chest pain. Repeat every 5 minutes up to 3 doses.  If no relief seek medical help (Do not take Cialis, Viagra or Levitra while you are on this medication)   oxyCODONE 5  MG immediate release tablet Commonly known as:  Oxy IR/ROXICODONE Take 5 mg by mouth every 6 (six) hours as needed for severe pain.   pantoprazole 40 MG tablet Commonly known as:  PROTONIX Take 40 mg by mouth 2 (two) times daily.       Disposition and follow-up:   [name] was discharged from [facility] in Stable condition.  At the hospital follow up visit please address:  1.  N/V - Patient admitted with history of N/V that was dark brown in color that had resolved after IVF administration. He did not have recurrence of his emesis and Hb was stable. Ensure sx have remained resolved  2.  Presumed aspiration pneumonia - Patient discharged with PO augmentin (3/26 -> 4/3). Please ensure compliance with this medication and improvement in his symptoms. Recommend CXR in 4-6 weeks to ensure resolution.  3.  Afib with RVR - New diagnosis, rate controlled on Metoprolol. CHADS2-VASc score at least 3 (CHF, HTN, vascular disease), with 3.2% annual risk of stroke. HAS-BLED score of 2 (meds, prior bleeding), with 4.1% risk of major bleed. Started on Eliquis--following discharge informed of pt difficulty obtaining this medication -> recommended f/u with PCP within [place] system to prescribe and have it covered. Consider cardioversion after 3-4 weeks of anti-coagulation.   NSTEMI, H/o CAD- Ensure resolution of chest  pain and adherence to Plavix. Cath performed as inpatient which re-demonstrated known CAD and blockages, recommended Plavix, no aspirin given bleeding risk and also on Eliquis.   Combined HF-  EF 20% by cath, 30% by echo. Assess volume status and encourage adherence to regimen. Repeat echo in 3 months and consider ICD if EF remains less than 35%      Labs / imaging needed at time of follow-up: CXR in 4-6 weeks    Pending labs/ test needing follow-up: None   Follow-up Appointments: Follow-up Information    [facility]. Schedule an appointment as soon as  possible for a visit in 1 week(s).   Specialty:  General Practice Contact information: [address] [place] [address] [phone]           Hospital Course by problem list:   1. Pneumonia, presumed aspiration Patient was admitted on 3/26 after ingestion of unknown pain medicine, sedation on exam, and questionable recall of events earlier in the day with nausea, vomiting, and CXR with right sided infiltrates consistent with PNA. He denies LOC episode, however clinical picture concerning for aspiration PNA. He was treated with a dose of ceftriaxone and azithromycin in the ED, which was changed to IV unasyn the following day. His respiratory status was clinically improved the day after, so Unasyn was changed to PO Augmentin to complete 7 days total of antibiotics (3/26 -> 4/3).  2. AKI, likely secondary to volume depletion Cr 2.41 on admission (baseline ~1.06), likely secondary to volume depletion in setting of N/V. His Cr improved to baseline after administration of IVF. Remained stable following cath.   3. Chronic combined systolic and diastolic CHF Home imdur, metoprolol, and losartan were held on admission in setting of volume depletion and N/V. Most recent TTE 03/2017 showed LVEF 35-40%, diffuse hypokinesis, and grade 2 DD. He received >5L IVF on admission due to volume depletion in setting of N/V. The following morning, he developed respiratory distress. On exam, he appeared clinically volume overloaded. He received IV diuresis with improvement in his symptoms. Repeat echo this admission estimated EF around 20%. His cardiac regimen was altered to include Losartan 25 mg, Metoprolol XL 200 mg daily, Atorvastatin 80 mg.   4. Hematemesis Patient admitted with several episodes of reported nausea and "dark brown" emesis concerning for GI bleed. He has a history of severe gastritis, Barrett's esophagus, and previous GI bleed. He did report recent use of ibuprofen for chronic shoulder pain, as  well as ASA and Plavix for CAD. He denied melena or hematochezia. N/V resolved with fluid administration and he did not have an episode of recurrence. Hb remained stable at around his baseline and was managed conservatively. He was eventually evaluated by GI with EGD showing no signs of active bleeding. He was prescribed PPI BID.   5. Afib with RVR New diagnosis. Possibly secondary to acute illness? HR increased to 130s-140s, which improved after initiation of home PO metoprolol and several doses of IV metoprolol. TSH normal, Mg 1.6 (repleted). Patient denied known diagnosis of irregular rhythm. His metoprolol was up-titrated to achieve rate control. CHADS2-VASc score at least 3 (CHF, HTN, vascular disease), with 3.2% annual risk of stroke. HAS-BLED score of 2 (meds, prior bleeding), with 4.1% risk of major bleed. He was ultimately started on anti-coagulation with Eliquis. [facility] discharge [facility], [facility] informed [facility] that [facility] was [facility]. He was instructed to follow up  with PCP and/or Cardiologist at [facility] as soon as available, who may prescribe this medication.  6. NSTEMI, H/o CAD s/p CABG, RCA BMS (2013, 2014), DES (Aug 2018) Patient experienced an episode of chest pressure during admission with accompanying troponin elevation which peaked at 1.14, EKG without ischemic changes.  He was started on IV heparin drip and it was felt his symptoms and troponin were due to increased demand in the setting of A. fib and acute illness.  Cardiology performed catheterization which showed multiple vessel occlusion in the setting of known diffuse disease.  Medical management was recommended and the patient was restarted on Plavix 75 mg, aspirin was held given history of GI bleed and above concern for hematemesis, as well as decision for Eliquis above.   Discharge Vitals:   BP 108/76 (BP Location: Left Arm)   Pulse (!) 49   Temp 98 F  (36.7 C) (Oral)   Resp 18   Ht 5\' 9"  (1.753 m)   Wt 164 lb 3.9 oz (74.5 kg)   SpO2 97%   BMI 24.25 kg/m   Pertinent Labs, Studies, and Procedures:  CBC Latest Ref Rng & Units 03/25/2018 03/24/2018 03/23/2018  WBC 4.0 - 10.5 K/uL 6.1 8.7 6.9  Hemoglobin 13.0 - 17.0 g/dL 1.1(B) 1.4(N) 8.2(N)  Hematocrit 39.0 - 52.0 % 28.5(L) 29.5(L) 29.7(L)  Platelets 150 - 400 K/uL 164 162 164   CMP Latest Ref Rng & Units 03/25/2018 03/24/2018 03/23/2018  Glucose 65 - 99 mg/dL 85 562(Z) 308(M)  BUN 6 - 20 mg/dL 20 19 19   Creatinine 0.61 - 1.24 mg/dL 5.78 4.69 6.29  Sodium 135 - 145 mmol/L 134(L) 134(L) 133(L)  Potassium 3.5 - 5.1 mmol/L 4.1 3.7 4.0  Chloride 101 - 111 mmol/L 104 102 102  CO2 22 - 32 mmol/L 20(L) 23 21(L)  Calcium 8.9 - 10.3 mg/dL 5.2(W) 4.1(L) 2.4(M)  Total Protein 6.5 - 8.1 g/dL - - -  Total Bilirubin 0.3 - 1.2 mg/dL - - -  Alkaline Phos 38 - 126 U/L - - -  AST 15 - 41 U/L - - -  ALT 17 - 63 U/L - - -   Lactic acid 2.18 -> 1.52 INR 1.13 Lipase 28 BCx 3/26 No growth x 5 days (final) HIV nonreactive TSH 2.005 Mg 1.6 UA with small Hgb  CXR 03/18/2018 Right upper lobe and lower lobe interstitial and alveolar airspace opacities which may reflect multi lobar pneumonia versus asymmetric pulmonary edema.  Echo: Left ventricle: The cavity size was moderately dilated. The estimated ejection fraction was 20%. Diffuse hypokinesis. Aortic valve: Valve mobility was restricted. There was mild stenosis. Valve area (VTI): 2.07 cm^2. Valve area (Vmax): 1.95 cm^2. Valve area (Vmean): 1.89 cm^2. Mitral valve: There was mild regurgitation. Left atrium: The atrium was moderately dilated. Right ventricle: Systolic function was mildly reduced. Right atrium: The atrium was mildly dilated.  Cath:  Prox RCA to Mid RCA lesion is 100% stenosed.Previously placed Ost Cx to Prox Cx stent (unknown type) is widely patent. Prox Cx to Mid Cx lesion is 100% stenosed. Prox LAD to Mid LAD lesion is 80% stenosed.  Origin to Prox Graft lesion is 100% stenosed. Origin to Prox Graft lesion is 100% stenosed. Prox LAD-1 lesion is 60% stenosed. Prox LAD-2 lesion is 60% stenosed.   Discharge Instructions: Discharge Instructions    Diet - low sodium heart healthy   Complete by:  As directed    Increase activity slowly   Complete by:  As directed  Signed: Ginger Carne, MD 03/26/2018, 2:27 PM   Pager: Demetrius Charity [phone]

## 2018-03-19 NOTE — Progress Notes (Signed)
Pt with some dyspnea but not in respiratory distress, says the breathing treatment helped some but he is still short of breath.  Bibasilar crackles appreciated, JVD, consistent with volume overload.  Pt steadily drinking all night per him and nurse and getting fluids, BP back to normal this am.  Pt states he takes lasix daily but I do not see this on chart review.  Will give 40mg  Lasix IV to help improve respiratory status.

## 2018-03-20 DIAGNOSIS — I110 Hypertensive heart disease with heart failure: Secondary | ICD-10-CM

## 2018-03-20 DIAGNOSIS — I4891 Unspecified atrial fibrillation: Secondary | ICD-10-CM

## 2018-03-20 LAB — URINALYSIS, ROUTINE W REFLEX MICROSCOPIC
BACTERIA UA: NONE SEEN
Bilirubin Urine: NEGATIVE
Glucose, UA: NEGATIVE mg/dL
Ketones, ur: NEGATIVE mg/dL
Leukocytes, UA: NEGATIVE
NITRITE: NEGATIVE
Protein, ur: NEGATIVE mg/dL
SPECIFIC GRAVITY, URINE: 1.008 (ref 1.005–1.030)
SQUAMOUS EPITHELIAL / LPF: NONE SEEN
pH: 5 (ref 5.0–8.0)

## 2018-03-20 LAB — BASIC METABOLIC PANEL
Anion gap: 9 (ref 5–15)
BUN: 23 mg/dL — ABNORMAL HIGH (ref 6–20)
CALCIUM: 8.3 mg/dL — AB (ref 8.9–10.3)
CO2: 21 mmol/L — ABNORMAL LOW (ref 22–32)
CREATININE: 0.99 mg/dL (ref 0.61–1.24)
Chloride: 100 mmol/L — ABNORMAL LOW (ref 101–111)
Glucose, Bld: 102 mg/dL — ABNORMAL HIGH (ref 65–99)
Potassium: 4.1 mmol/L (ref 3.5–5.1)
SODIUM: 130 mmol/L — AB (ref 135–145)

## 2018-03-20 LAB — CBC
HCT: 26.4 % — ABNORMAL LOW (ref 39.0–52.0)
Hemoglobin: 8.6 g/dL — ABNORMAL LOW (ref 13.0–17.0)
MCH: 26.7 pg (ref 26.0–34.0)
MCHC: 32.6 g/dL (ref 30.0–36.0)
MCV: 82 fL (ref 78.0–100.0)
PLATELETS: 123 10*3/uL — AB (ref 150–400)
RBC: 3.22 MIL/uL — AB (ref 4.22–5.81)
RDW: 16.6 % — ABNORMAL HIGH (ref 11.5–15.5)
WBC: 9 10*3/uL (ref 4.0–10.5)

## 2018-03-20 LAB — MAGNESIUM: MAGNESIUM: 1.6 mg/dL — AB (ref 1.7–2.4)

## 2018-03-20 LAB — HIV ANTIBODY (ROUTINE TESTING W REFLEX): HIV Screen 4th Generation wRfx: NONREACTIVE

## 2018-03-20 LAB — TSH: TSH: 2.005 u[IU]/mL (ref 0.350–4.500)

## 2018-03-20 MED ORDER — FUROSEMIDE 10 MG/ML IJ SOLN
40.0000 mg | Freq: Once | INTRAMUSCULAR | Status: AC
  Administered 2018-03-20: 40 mg via INTRAVENOUS
  Filled 2018-03-20: qty 4

## 2018-03-20 MED ORDER — POTASSIUM CHLORIDE CRYS ER 20 MEQ PO TBCR
20.0000 meq | EXTENDED_RELEASE_TABLET | Freq: Once | ORAL | Status: AC
  Administered 2018-03-20: 20 meq via ORAL
  Filled 2018-03-20: qty 1

## 2018-03-20 MED ORDER — MAGNESIUM SULFATE 4 GM/100ML IV SOLN
4.0000 g | Freq: Once | INTRAVENOUS | Status: AC
  Administered 2018-03-20: 4 g via INTRAVENOUS
  Filled 2018-03-20: qty 100

## 2018-03-20 MED ORDER — METOPROLOL TARTRATE 50 MG PO TABS
50.0000 mg | ORAL_TABLET | Freq: Two times a day (BID) | ORAL | Status: DC
  Administered 2018-03-20 – 2018-03-21 (×3): 50 mg via ORAL
  Filled 2018-03-20 (×3): qty 1

## 2018-03-20 MED ORDER — FUROSEMIDE 40 MG PO TABS
40.0000 mg | ORAL_TABLET | Freq: Every day | ORAL | Status: DC
  Administered 2018-03-20 – 2018-03-25 (×4): 40 mg via ORAL
  Filled 2018-03-20 (×5): qty 1

## 2018-03-20 MED ORDER — METOPROLOL TARTRATE 5 MG/5ML IV SOLN
5.0000 mg | Freq: Once | INTRAVENOUS | Status: AC
  Administered 2018-03-20: 5 mg via INTRAVENOUS
  Filled 2018-03-20: qty 5

## 2018-03-20 NOTE — Progress Notes (Signed)
Subjective:  Mr. Malizia was seen lying in bed this morning. He reports his breathing is improved. Denies chest pain or palpitations. He denies abdominal pain, N/V and is tolerating PO intake. Denies melena or hematemesis. He denies dizziness or lightheadedness.  Went into afib with RVR this AM, treated with home metoprolol as well as extra doses of IV metoprolol. Rates now improved. Patient denies a history of irregular heart rhythms in the past.   He denies lower extremity swelling. Patient is not sure how much lasix he is on at home.  Objective:  Vital signs in last 24 hours: Vitals:   03/20/18 0330 03/20/18 0352 03/20/18 0500 03/20/18 0511  BP: 126/82 119/69  128/79  Pulse: (!) 120 (!) 115    Resp:      Temp:    98.6 F (37 C)  TempSrc:    Oral  SpO2: 94%   93%  Weight:   184 lb 4.9 oz (83.6 kg)   Height:   5' 9.5" (1.765 m)    GEN: Lying in bed in NAD CV: NR & RR, no m/r/g RESP: Bibasilar rales bilaterally, R>L, no wheezing. No increased work of breathing on [place] ABD: Soft, nontender, nondistended, +BS EXT: 1+ pedal edema PSYCH: Calm and pleasant  Assessment/Plan:  Principal Problem:   Pneumonia Active Problems:   CAD, CABG X 27 March 2008   AKI (acute kidney injury) (HCC)   Chronic systolic CHF (congestive heart failure) (HCC)   Nausea and vomiting  Mr. Erekson is a 62 yo male with PMH of HFrEF (35-40%), G2DD, CAD (s/p CABG x5 in 2009, BMS SVG-RCA x2 in 2013 and 2014, and DES 2018) PAD s/p fem-pop bypass, bipolar disorder, and chronic shoulder pain who presents with ?hematemesis. No recurrence of hematemesis while here, however he is being treated for aspiration pneumonia given right-sided infiltrates on CXR and history of N/V and ingestion of unknown pain medication/sedation. Went into afib with RVR overnight - no previous diagnosis of afib. HR now improved after PO and IV metoprolol.  Afib with RVR New diagnosis. HR in 130s-140s overnight, which improved to 80s-90s with  PO and IV metoprolol. Currently in NSR. TTE in 03/2017 showed moderately dilated LA, LVEF 35-40% and diffuse hypokinesis. TSH normal, K normal, Mg 1.6. New onset afib likely secondary to ongoing infection. CHADS2-VASc score at least 3 (CHF, HTN, vascular disease), with 3.2% annual risk of stroke. HAS-BLED score of 2 (meds, prior bleeding), with 4.1% risk of major bleed. Patient also on ASA and Plavix for CAD/PAD - has a history of chronic opiates use with ?sedation, as well as prior GI bleed. Risks of starting anticoagulation in the setting of possible hematemesis this admission likely outweigh the benefits, thus we will hold anticoagulation for now and have patient follow-up with PCP for further discussion - Continue telemetry - Increase PO metoprolol to 50mg  BID - IV Mg 4g x1 - Will restart ASA and Plavix tomorrow, provided Hb remains stable - No anticoagulation for now - F/u with outpatient PCP  Pneumonia, presumed aspiration Patient with ingestion of unknown pain medicine, sedation on exam and questionable recall of events earlier in the day with nausea, vomiting and CXR with right sided infiltrates consistent with pneumonia. Satting well on RA. Currently on augmentin. Afebrile. - Continue PO Augmentin 875-125mg  BID x7 days total (3/26 -> 4/2) - F/u BCx 3/26 -> NG x24h  AKI, likely 2/2 volume depletion, resolved Cr 2.41 on admission, now back to baseline of 0.99 after IVF (baseline ~  1).  N/V, resolved Hx of severe gastritis (EGD 2005), Barrett's esophagus, previous GI bleed Concerning for GI bleed with history of multiple GI issues and use of ASA and plavix for CAD and ibuprofen for pain. Denies melena or hematochezia. No abdominal pain or recurrence of N/V. Hgb slightly lower from 9.3 -> 8.6 this AM (baseline ~10-11.9). - IV protonix 40mg  BID - IV Zofran PRN - Holding home ASA, plavix, and ibuprofen - CBC in AM - Can restart home ASA and plavix tomorrow  CAD s/p CABG and RCA BMS 2013,  2014 and DES 07/2017 PAD s/p fem-pop bypass No CP. - Holding home ASA and plavix in setting of possible hematemesis - Continue home atorvastatin 80mg  daily, ezetimibe 10mg  daily  Dyspnea, improved after diuresis HFrEF (03/2017 TTE: EF 35-40%, G2DD) HTN Home regimen includes imdur 30mg  daily, losartan 50mg  daily, and metoprolol 25mg  BID. BP 128/79. Continues to have bibasilar crackles and LE pedal edema on exam, likely 2/2 iatrogenic fluid administration on admission. S/p 2 doses of IV lasix yesterday with improvement in his symptoms. Likely still slightly volume overloaded. Patient unsure what his home lasix regimen is. Net negative 3500cc yesterday. - Holding home imdur 30mg  daily, losartan 50mg  daily given normotension - Increase metoprolol to 50mg  BID - Daily weights - Strict I/Os - PO lasix 40mg  x1  Bipolar disorder - Continue home divalproex 1000mg  QHS  Chronic Shoulder Pain [facility]. Gets extra narcotics off the street when he runs out of his prescription during the month. Will continue to emphasize importance of abstaining from extra narcotics due to multiple risks, including sedation and subsequent aspiration. - Tylenol PRN - Voltaren gel 2g QID - Oxy IR 5mg  q12h PRN  Code Status: DNR/DNI Dispo: Anticipated discharge in approximately 1-2 day(s).  Scherrie GerlachHuang, Lititia Sen, MD 03/20/2018, 8:49 AM Pager: Demetrius CharityP [phone]

## 2018-03-20 NOTE — Progress Notes (Signed)
Assessed pt at bedside for SOB and chest tightness.  Chest tightness is central and does not radiate, began when pt became SOB, worse with deep inspiration, pt is sweating and breathing is labored, he is saturating well on 2L [place].  He reports drinking a good amount of fluids but has had decreased appetite today due to feeling bloated and distended.  On exam he is tachycardic with an irregularly irregular rhythm, he has 2+ LE edema bilaterally, JVD to ear, bilateral crackles in lower to middle lung fields, will give lasix IV 40mg  as pt had good output with this dose on prior evaluation.  EKG shows subtle deepining of T waves in inferior leads, will check troponin.  Just received his metoprolol oral dose about 15 minutes prior to my arrival HR 115 on ecg. Will continue to monitor

## 2018-03-20 NOTE — Progress Notes (Signed)
Pt converted back into Afib with pulse sustaining in mid 140's. Administered PRN dose of metoprolol with decrease in HR to 120's sustained. Notified MD on call, will continue to monitor and await new orders.

## 2018-03-21 DIAGNOSIS — I5023 Acute on chronic systolic (congestive) heart failure: Secondary | ICD-10-CM

## 2018-03-21 DIAGNOSIS — I481 Persistent atrial fibrillation: Secondary | ICD-10-CM

## 2018-03-21 DIAGNOSIS — I2510 Atherosclerotic heart disease of native coronary artery without angina pectoris: Secondary | ICD-10-CM

## 2018-03-21 DIAGNOSIS — I5043 Acute on chronic combined systolic (congestive) and diastolic (congestive) heart failure: Secondary | ICD-10-CM

## 2018-03-21 DIAGNOSIS — I214 Non-ST elevation (NSTEMI) myocardial infarction: Secondary | ICD-10-CM

## 2018-03-21 DIAGNOSIS — J690 Pneumonitis due to inhalation of food and vomit: Secondary | ICD-10-CM

## 2018-03-21 LAB — BASIC METABOLIC PANEL
ANION GAP: 13 (ref 5–15)
BUN: 19 mg/dL (ref 6–20)
CHLORIDE: 94 mmol/L — AB (ref 101–111)
CO2: 19 mmol/L — AB (ref 22–32)
Calcium: 8.8 mg/dL — ABNORMAL LOW (ref 8.9–10.3)
Creatinine, Ser: 0.96 mg/dL (ref 0.61–1.24)
GFR calc non Af Amer: >60 mL/min (ref >60)
Glucose, Bld: 94 mg/dL (ref 65–99)
Potassium: 3.6 mmol/L (ref 3.5–5.1)
Sodium: 126 mmol/L — ABNORMAL LOW (ref 135–145)

## 2018-03-21 LAB — HEPARIN LEVEL (UNFRACTIONATED): Heparin Unfractionated: <0.1 IU/mL — ABNORMAL LOW (ref 0.30–0.70)

## 2018-03-21 LAB — CBC
HCT: 27.6 % — ABNORMAL LOW (ref 39.0–52.0)
Hemoglobin: 8.7 g/dL — ABNORMAL LOW (ref 13.0–17.0)
MCH: 25.3 pg — ABNORMAL LOW (ref 26.0–34.0)
MCHC: 31.5 g/dL (ref 30.0–36.0)
MCV: 80.2 fL (ref 78.0–100.0)
PLATELETS: 148 10*3/uL — AB (ref 150–400)
RBC: 3.44 MIL/uL — ABNORMAL LOW (ref 4.22–5.81)
RDW: 16.4 % — AB (ref 11.5–15.5)
WBC: 9.8 10*3/uL (ref 4.0–10.5)

## 2018-03-21 LAB — TROPONIN I
TROPONIN I: 1.14 ng/mL — AB (ref <0.03)
TROPONIN I: 0.9 ng/mL — AB (ref <0.03)
Troponin I: 1.07 ng/mL (ref <0.03)
Troponin I: 0.7 ng/mL (ref <0.03)

## 2018-03-21 MED ORDER — METOPROLOL TARTRATE 50 MG PO TABS
75.0000 mg | ORAL_TABLET | Freq: Two times a day (BID) | ORAL | Status: DC
  Administered 2018-03-21: 23:00:00 75 mg via ORAL
  Filled 2018-03-21 (×2): qty 1

## 2018-03-21 MED ORDER — METOPROLOL TARTRATE 5 MG/5ML IV SOLN
2.5000 mg | Freq: Once | INTRAVENOUS | Status: AC
  Administered 2018-03-21: 2.5 mg via INTRAVENOUS
  Filled 2018-03-21: qty 5

## 2018-03-21 MED ORDER — HEPARIN (PORCINE) IN NACL 100-0.45 UNIT/ML-% IJ SOLN
1700.0000 [IU]/h | INTRAMUSCULAR | Status: DC
  Administered 2018-03-21: 1500 [IU]/h via INTRAVENOUS
  Administered 2018-03-21: 1000 [IU]/h via INTRAVENOUS
  Filled 2018-03-21 (×3): qty 250

## 2018-03-21 MED ORDER — HEPARIN BOLUS VIA INFUSION
2500.0000 [IU] | Freq: Once | INTRAVENOUS | Status: DC
  Filled 2018-03-21: qty 2500

## 2018-03-21 MED ORDER — HEPARIN BOLUS VIA INFUSION
2000.0000 [IU] | Freq: Once | INTRAVENOUS | Status: AC
  Administered 2018-03-21: 2000 [IU] via INTRAVENOUS
  Filled 2018-03-21: qty 2000

## 2018-03-21 MED ORDER — IPRATROPIUM-ALBUTEROL 0.5-2.5 (3) MG/3ML IN SOLN: 3.0000 mL | Freq: Four times a day (QID) | RESPIRATORY_TRACT | Status: DC | PRN

## 2018-03-21 MED ORDER — FUROSEMIDE 10 MG/ML IJ SOLN
40.0000 mg | Freq: Once | INTRAMUSCULAR | Status: AC
  Administered 2018-03-21: 40 mg via INTRAVENOUS
  Filled 2018-03-21: qty 4

## 2018-03-21 MED ORDER — NITROGLYCERIN 0.4 MG SL SUBL: 0.4000 mg | SUBLINGUAL_TABLET | SUBLINGUAL | Status: DC | PRN

## 2018-03-21 MED ORDER — HEPARIN BOLUS VIA INFUSION
1000.0000 [IU] | Freq: Once | INTRAVENOUS | Status: AC
  Administered 2018-03-21: 1000 [IU] via INTRAVENOUS
  Filled 2018-03-21: qty 1000

## 2018-03-21 NOTE — Progress Notes (Signed)
Discussed pt with on call cards fellow, starting heparin and sublingual nitro, trending troponins, ecgs.  Pts bp stable at this time, cp improving, will monitor closely.

## 2018-03-21 NOTE — Progress Notes (Signed)
CRITICAL VALUE STICKER  CRITICAL VALUE: Troponin 1.07  RECEIVER (on-site recipient of call): Marisa SprinklesSarah Dannon Nguyenthi RN  DATE & TIME NOTIFIED: 3/39/19   12:35am  MESSENGER (representative from lab):   MD NOTIFIED: Dr. Frances FurbishWinfrey  TIME OF NOTIFICATION: Text paged at 12:37 am  RESPONSE:

## 2018-03-21 NOTE — Progress Notes (Signed)
ANTICOAGULATION CONSULT NOTE -Follow up  Pharmacy Consult for heparin Indication: chest pain/ACS  Allergies  Allergen Reactions  . Prednisone Other (See Comments)    Causes steroid rage (takes regularly at home though)  . Lisinopril Cough    Patient Measurements: Height: 5' 9.5" (176.5 cm) Weight: 184 lb 4.9 oz (83.6 kg) IBW/kg (Calculated) : 71.85 Heparin Dosing Weight: 83.6 kg  Vital Signs: Temp: 98.9 F (37.2 C) (03/29 0438) Temp Source: Oral (03/29 0438) BP: 116/78 (03/29 0539) Pulse Rate: 106 (03/29 0438)  Labs: Recent Labs    03/18/18 1314 03/19/18 0957 03/20/18 0442 03/20/18 2255 03/21/18 0216 03/21/18 0753  HGB 10.0* 9.3* 8.6*  --   --  8.7*  HCT 31.7* 28.9* 26.4*  --   --  27.6*  PLT 178 122* 123*  --   --  148*  LABPROT 14.4  --   --   --   --   --   INR 1.13  --   --   --   --   --   HEPARINUNFRC  --   --   --   --   --  <0.10*  CREATININE 2.41* 1.32* 0.99  --  0.96  --   TROPONINI  --   --   --  1.07* 1.14* 0.90*    Estimated Creatinine Clearance: 80.1 mL/min (by C-G formula based on SCr of 0.96 mg/dL).   Medical History: Past Medical History:  Diagnosis Date  . Acute myocardial infarction of other inferior wall, initial episode of care   . Alcohol abuse   . Anxiety   . Arthritis   . Barrett's esophagus   . Bipolar 2 disorder (HCC)   . CAD (coronary artery disease)   . CHF (congestive heart failure) (HCC)   . Coronary artery disease   . Depression   . Diverticulosis   . GERD (gastroesophageal reflux disease)   . Gout   . Hyperlipemia   . Hypertension   . MI (myocardial infarction) (HCC)   . PAD (peripheral artery disease) (HCC)    Moderate LE atherosclerosis.  . Pneumonia   . S/P right coronary artery (RCA) stent placement 03/04/2012    Medications:  Medications Prior to Admission  Medication Sig Dispense Refill Last Dose  . acetaminophen (TYLENOL) 500 MG tablet Take 500 mg by mouth every 6 (six) hours as needed.   unknown at prn   . albuterol (PROVENTIL HFA;VENTOLIN HFA) 108 (90 BASE) MCG/ACT inhaler Inhale 2 puffs into the lungs every 6 (six) hours as needed. WHEEZING   03/15/2018 at prn  . allopurinol (ZYLOPRIM) 100 MG tablet Take 450 mg by mouth daily.    03/17/2018 at Unknown time  . aspirin EC 81 MG tablet Take 81 mg by mouth daily.   03/17/2018 at Unknown time  . atorvastatin (LIPITOR) 80 MG tablet Take 80 mg by mouth at bedtime.    03/17/2018 at Unknown time  . clopidogrel (PLAVIX) 75 MG tablet Take 75 mg by mouth daily.   03/17/2018 at Unknown time  . divalproex (DEPAKOTE) 500 MG DR tablet Take 1,000 mg by mouth at bedtime.    03/17/2018 at Unknown time  . ezetimibe (ZETIA) 10 MG tablet Take 10 mg by mouth daily.   03/17/2018 at Unknown time  . fenofibrate (TRICOR) 145 MG tablet Take 145 mg by mouth daily.    03/17/2018 at Unknown time  . isosorbide mononitrate (IMDUR) 30 MG 24 hr tablet Take 30 mg by mouth daily.   03/17/2018  at Unknown time  . losartan (COZAAR) 50 MG tablet Take 50 mg by mouth daily.   03/17/2018 at Unknown time  . metoprolol (LOPRESSOR) 25 MG tablet Take 1 tablet (25 mg total) by mouth 2 (two) times daily. (Patient taking differently: Take 300 mg by mouth daily. ) 60 tablet 1 03/17/2018 at 2230  . nitroGLYCERIN (NITROSTAT) 0.4 MG SL tablet Place 0.4 mg under the tongue every 5 (five) minutes as needed for chest pain. Repeat every 5 minutes up to 3 doses.  If no relief seek medical help (Do not take Cialis, Viagra or Levitra while you are on this medication)   03/15/2018 at prn  . Omega-3 Fatty Acids (FISH OIL) 1000 MG CAPS Take 2,000 mg by mouth 3 (three) times daily.    03/17/2018 at Unknown time  . oxyCODONE (OXY IR/ROXICODONE) 5 MG immediate release tablet Take 5 mg by mouth every 6 (six) hours as needed for severe pain.   03/16/2018 at prn  . pantoprazole (PROTONIX) 40 MG tablet Take 40 mg by mouth 2 (two) times daily.   03/17/2018 at Unknown time    Assessment: 62 yo man to start heparin for CP and  elevated troponin.  He has a h/o GI bleed and Hg and PTLC are low. Will not give full bolus of heparin and lower heparin level goal. Heparin level at 0730 undetectable, will give higher bolus and increase drip.  Goal of Therapy:  Heparin level 0.3-0.5 units/hr Monitor platelets by anticoagulation protocol: Yes   Plan:  Heparin bolus 2000 units and increase drip to 1300 units/hr  Check heparin level in 6 hours after increase and daily while on heparin Monitor for bleeding complications  Elishia Kaczorowski A Saryn Cherry 03/21/2018,10:21 AM

## 2018-03-21 NOTE — Plan of Care (Signed)

## 2018-03-21 NOTE — Progress Notes (Signed)
ANTICOAGULATION CONSULT NOTE - Initial Consult  Pharmacy Consult for heparin Indication: chest pain/ACS  Allergies  Allergen Reactions  . Prednisone Other (See Comments)    Causes steroid rage (takes regularly at home though)  . Lisinopril Cough    Patient Measurements: Height: 5' 9.5" (176.5 cm) Weight: 184 lb 4.9 oz (83.6 kg) IBW/kg (Calculated) : 71.85 Heparin Dosing Weight: 83.6 kg  Vital Signs: Temp: 98.6 F (37 C) (03/29 0059) Temp Source: Oral (03/29 0059) BP: 130/85 (03/29 0059) Pulse Rate: 127 (03/29 0059)  Labs: Recent Labs    03/18/18 1314 03/19/18 0957 03/20/18 0442 03/20/18 2255  HGB 10.0* 9.3* 8.6*  --   HCT 31.7* 28.9* 26.4*  --   PLT 178 122* 123*  --   LABPROT 14.4  --   --   --   INR 1.13  --   --   --   CREATININE 2.41* 1.32* 0.99  --   TROPONINI  --   --   --  1.07*    Estimated Creatinine Clearance: 77.7 mL/min (by C-G formula based on SCr of 0.99 mg/dL).   Medical History: Past Medical History:  Diagnosis Date  . Acute myocardial infarction of other inferior wall, initial episode of care   . Alcohol abuse   . Anxiety   . Arthritis   . Barrett's esophagus   . Bipolar 2 disorder (HCC)   . CAD (coronary artery disease)   . CHF (congestive heart failure) (HCC)   . Coronary artery disease   . Depression   . Diverticulosis   . GERD (gastroesophageal reflux disease)   . Gout   . Hyperlipemia   . Hypertension   . MI (myocardial infarction) (HCC)   . PAD (peripheral artery disease) (HCC)    Moderate LE atherosclerosis.  . Pneumonia   . S/P right coronary artery (RCA) stent placement 03/04/2012    Medications:  Medications Prior to Admission  Medication Sig Dispense Refill Last Dose  . acetaminophen (TYLENOL) 500 MG tablet Take 500 mg by mouth every 6 (six) hours as needed.   unknown at prn  . albuterol (PROVENTIL HFA;VENTOLIN HFA) 108 (90 BASE) MCG/ACT inhaler Inhale 2 puffs into the lungs every 6 (six) hours as needed. WHEEZING    03/15/2018 at prn  . allopurinol (ZYLOPRIM) 100 MG tablet Take 450 mg by mouth daily.    03/17/2018 at Unknown time  . aspirin EC 81 MG tablet Take 81 mg by mouth daily.   03/17/2018 at Unknown time  . atorvastatin (LIPITOR) 80 MG tablet Take 80 mg by mouth at bedtime.    03/17/2018 at Unknown time  . clopidogrel (PLAVIX) 75 MG tablet Take 75 mg by mouth daily.   03/17/2018 at Unknown time  . divalproex (DEPAKOTE) 500 MG DR tablet Take 1,000 mg by mouth at bedtime.    03/17/2018 at Unknown time  . ezetimibe (ZETIA) 10 MG tablet Take 10 mg by mouth daily.   03/17/2018 at Unknown time  . fenofibrate (TRICOR) 145 MG tablet Take 145 mg by mouth daily.    03/17/2018 at Unknown time  . isosorbide mononitrate (IMDUR) 30 MG 24 hr tablet Take 30 mg by mouth daily.   03/17/2018 at Unknown time  . losartan (COZAAR) 50 MG tablet Take 50 mg by mouth daily.   03/17/2018 at Unknown time  . metoprolol (LOPRESSOR) 25 MG tablet Take 1 tablet (25 mg total) by mouth 2 (two) times daily. (Patient taking differently: Take 300 mg by mouth daily. )  60 tablet 1 03/17/2018 at 2230  . nitroGLYCERIN (NITROSTAT) 0.4 MG SL tablet Place 0.4 mg under the tongue every 5 (five) minutes as needed for chest pain. Repeat every 5 minutes up to 3 doses.  If no relief seek medical help (Do not take Cialis, Viagra or Levitra while you are on this medication)   03/15/2018 at prn  . Omega-3 Fatty Acids (FISH OIL) 1000 MG CAPS Take 2,000 mg by mouth 3 (three) times daily.    03/17/2018 at Unknown time  . oxyCODONE (OXY IR/ROXICODONE) 5 MG immediate release tablet Take 5 mg by mouth every 6 (six) hours as needed for severe pain.   03/16/2018 at prn  . pantoprazole (PROTONIX) 40 MG tablet Take 40 mg by mouth 2 (two) times daily.   03/17/2018 at Unknown time    Assessment: 62 yo man to start heparin for CP and elevated troponin.  He has a h/o GI bleed and Hg and PTLC are low. Will not give full bolus of heparin and lower heparin level goal. Goal of Therapy:   Heparin level 0.3-0.5 units/hr Monitor platelets by anticoagulation protocol: Yes   Plan:  Heparin bolus 1000 units and start drip at 1000 units/hr (12 units/kg/hr) Check heparin level and CBC 6 hours after start and daily while on heparin Monitor for bleeding complications  Graison Leinberger Poteet 03/21/2018,1:12 AM

## 2018-03-21 NOTE — Progress Notes (Signed)
Subjective:  Mr. Aikman was seen lying in bed this morning. He reports his breathing is okay. Denies chest pain or palpitations currently, but did have an episode of chest pressure this morning. Denies abdominal pain, N/V, and is tolerating PO intake. Has not had a BM since he's been here. Denies dizziness or lightheadedness. Denies lower extremity swelling.  Went into afib with RVR this AM despite increase in PO metoprolol yesterday. Required several doses of IV metoprolol. Rates now improved.  Troponin also elevated to 1.07 -> 1.14, started on a heparin drip per on-call cardiology fellow.  Objective:  Vital signs in last 24 hours: Vitals:   03/21/18 0356 03/21/18 0438 03/21/18 0531 03/21/18 0539  BP: 113/65 (!) 144/79 120/87 116/78  Pulse:  (!) 106    Resp:  18    Temp:  98.9 F (37.2 C)    TempSrc:  Oral    SpO2:  94%    Weight:      Height:       GEN: Lying in bed in NAD CV: irregularly irregular, tachycardic, no m/r/g RESP: Bibasilar rales bilaterally, R>L, no wheezing. No increased work of breathing on [place] ABD: Soft, nontender, nondistended, +BS EXT: 1+ pedal edema PSYCH: Calm and pleasant  Assessment/Plan:  Principal Problem:   Pneumonia Active Problems:   CAD, CABG X 27 March 2008   AKI (acute kidney injury) (HCC)   Chronic systolic CHF (congestive heart failure) (HCC)   Nausea and vomiting  Mr. Lamantia is a 63 yo male with PMH of HFrEF (35-40%), G2DD, CAD (s/p CABG x5 in 2009, BMS SVG-RCA x2 in 2013 and 2014, and DES 2018) PAD s/p fem-pop bypass, bipolar disorder, and chronic shoulder pain who presents with ?hematemesis. No recurrence of hematemesis while here, however he is being treated for aspiration pneumonia given right-sided infiltrates on CXR and history of N/V and ingestion of unknown pain medication/sedation. Newly noted afib with RVR while inpatient - requiring extra doses of IV metoprolol on top of increased PO metoprolol. Also had an episode of chest  pressure last night. Troponin elevated and EKG without acute ischemic changes, concerning for NSTEMI.  NSTEMI CAD s/p CABG and RCA BMS 2013, 2014 and DES 07/2017 PAD s/p fem-pop bypass Had an episode of chest pressure last night. Troponin elevated at 1.07 -> 1.14 -> 0.90 and EKG without acute ischemic changes. Started on IV heparin drip overnight. NSTEMI could be 2/2 demand in the setting of afib with RVR and acute illness. He does have an extensive cardiac history and we have been holding ASA and Plavix while he has been here due to concern for GI bleed. At this point, he likely can restart ASA and/or Plavix, as he has not had recurrence of hematemesis while here, Hb stable, and had recent DES placed. Will await further Cardiology recs. - Cardiology consulted; appreciate their assistance - Continue IV heparin drip - Holding home ASA and Plavix, may need to restart one or both, will defer to Cards - Continue home atorvastatin 80mg  daily, ezetimibe 10mg  daily  Afib with RVR New diagnosis. HR in 130s-140s overnight, remaining in afib with rates in 110s with increased PO metoprolol and extra doses of IV metoprolol last night. and IV metoprolol. New onset afib possibly secondary to ongoing infection. Currently on IV heparin drip for NSTEMI. CHADS2-VASc score of at least 3 and with hx of GI bleed, patient may not be great candidate for long-term anticoagulation. Cards consulted for assistance and will need follow-up with PCP  for further management. - Cardiology consulted; appreciate their recommendations - Continue telemetry - Continue PO metoprolol 50mg  BID - Currently on IV heparin drip - F/u with outpatient PCP  Pneumonia, presumed aspiration Patient with ingestion of unknown pain medicine, sedation on exam and questionable recall of events earlier in the day with nausea, vomiting and CXR with right sided infiltrates consistent with pneumonia. Satting well on RA. Currently on augmentin. Afebrile. -  Continue PO Augmentin 875-125mg  BID x7 days total (3/26 -> 4/2) - F/u BCx 3/26 -> NG x3d  AKI, likely 2/2 volume depletion, resolved  N/V, resolved Hx of severe gastritis (EGD 2005), Barrett's esophagus, previous GI bleed Concerning for GI bleed with history of multiple GI issues and use of ASA and plavix for CAD and ibuprofen for pain. Denies melena or hematochezia. No abdominal pain or recurrence of N/V. Hgb stable at 8.6 -> 8.7 this AM (baseline ~10-11.9). - IV protonix 40mg  BID - IV Zofran PRN - Holding home ASA, plavix, and ibuprofen - CBC in AM  Dyspnea, improved after diuresis HFrEF (03/2017 TTE: EF 35-40%, G2DD) HTN Home regimen includes imdur 30mg  daily, losartan 50mg  daily, and metoprolol 25mg  BID. BP 116/78. Continues to have bibasilar crackles and LE pedal edema on exam, likely 2/2 iatrogenic fluid administration on admission. S/p several doses of IV lasix with improvement in symptoms. Likely still slightly volume overloaded. Patient unsure what his home lasix regimen is. Net negative 1200cc yesterday. - Holding home imdur 30mg  daily, losartan 50mg  daily given normotension - Continue metoprolol 50mg  BID - Daily weights - Strict I/Os - PO lasix 40mg  daily - Can consider IV lasix 40mg  later this afternoon if still volume up  Bipolar disorder - Continue home divalproex 1000mg  QHS  Chronic Shoulder Pain On narcotics [facility] by [facility]. Gets extra narcotics off the street when he runs out of his prescription during the month. Will continue to emphasize importance of abstaining from extra narcotics due to multiple risks, including sedation and subsequent aspiration. - Tylenol PRN - Voltaren gel 2g QID - Oxy IR 5mg  q12h PRN  Code Status: DNR/DNI Dispo: Anticipated discharge in approximately 1-2 day(s).  Scherrie GerlachHuang, Karema Tocci, MD 03/21/2018, 6:38 AM Pager: Demetrius CharityP [phone]

## 2018-03-21 NOTE — Progress Notes (Signed)
IV heparin started per orders. Pt denies chest pain or pressure at this time.  C/O chronic right shoulder pain and some lower abdominal cramping that Pt states "it feels like gas pains".  Vitals stable and EKG obtained per orders. Pt denies shortness of breath at this time. Denies other needs. Will cont to monitor.

## 2018-03-21 NOTE — Progress Notes (Signed)
Pt C/O feeling "funny" HR noted to be in the 130s to 140s A.fib on telemetry. Pt has slightly labored breathing and diaphoresis. Pt notified nurse of feeling some pressure in left chest as well. Describes as 5/10 on pain scale.  Notified MD on call who came to bedside to assess. EKG obtained. O2 at 2 L via [place] given with improvement in chest pressure to 2/10.  New orders obtained for IV lasix and troponin. Will continue to monitor.

## 2018-03-21 NOTE — Progress Notes (Signed)
ANTICOAGULATION CONSULT NOTE -Follow up  Pharmacy Consult for heparin Indication: chest pain/ACS  Allergies  Allergen Reactions  . Prednisone Other (See Comments)    Causes steroid rage (takes regularly at home though)  . Lisinopril Cough    Patient Measurements: Height: 5' 9.5" (176.5 cm) Weight: 164 lb 10.9 oz (74.7 kg) IBW/kg (Calculated) : 71.85 Heparin Dosing Weight: 83.6 kg  Vital Signs: Temp: 97.8 F (36.6 C) (03/29 1318) Temp Source: Oral (03/29 1318) BP: 121/67 (03/29 1318) Pulse Rate: 59 (03/29 1318)  Labs: Recent Labs    03/19/18 0957 03/20/18 0442  03/21/18 0216 03/21/18 0753 03/21/18 1443 03/21/18 1613  HGB 9.3* 8.6*  --   --  8.7*  --   --   HCT 28.9* 26.4*  --   --  27.6*  --   --   PLT 122* 123*  --   --  148*  --   --   HEPARINUNFRC  --   --   --   --  <0.10*  --  <0.10*  CREATININE 1.32* 0.99  --  0.96  --   --   --   TROPONINI  --   --    < > 1.14* 0.90* 0.70*  --    < > = values in this interval not displayed.    Estimated Creatinine Clearance: 80.1 mL/min (by C-G formula based on SCr of 0.96 mg/dL).   Medical History: Past Medical History:  Diagnosis Date  . Acute myocardial infarction of other inferior wall, initial episode of care   . Alcohol abuse   . Anxiety   . Arthritis   . Barrett's esophagus   . Bipolar 2 disorder (HCC)   . CAD (coronary artery disease)   . CHF (congestive heart failure) (HCC)   . Coronary artery disease   . Depression   . Diverticulosis   . GERD (gastroesophageal reflux disease)   . Gout   . Hyperlipemia   . Hypertension   . MI (myocardial infarction) (HCC)   . PAD (peripheral artery disease) (HCC)    Moderate LE atherosclerosis.  . Pneumonia   . S/P right coronary artery (RCA) stent placement 03/04/2012    Medications:  Medications Prior to Admission  Medication Sig Dispense Refill Last Dose  . acetaminophen (TYLENOL) 500 MG tablet Take 500 mg by mouth every 6 (six) hours as needed.   unknown at  prn  . albuterol (PROVENTIL HFA;VENTOLIN HFA) 108 (90 BASE) MCG/ACT inhaler Inhale 2 puffs into the lungs every 6 (six) hours as needed. WHEEZING   03/15/2018 at prn  . allopurinol (ZYLOPRIM) 100 MG tablet Take 450 mg by mouth daily.    03/17/2018 at Unknown time  . aspirin EC 81 MG tablet Take 81 mg by mouth daily.   03/17/2018 at Unknown time  . atorvastatin (LIPITOR) 80 MG tablet Take 80 mg by mouth at bedtime.    03/17/2018 at Unknown time  . clopidogrel (PLAVIX) 75 MG tablet Take 75 mg by mouth daily.   03/17/2018 at Unknown time  . divalproex (DEPAKOTE) 500 MG DR tablet Take 1,000 mg by mouth at bedtime.    03/17/2018 at Unknown time  . ezetimibe (ZETIA) 10 MG tablet Take 10 mg by mouth daily.   03/17/2018 at Unknown time  . fenofibrate (TRICOR) 145 MG tablet Take 145 mg by mouth daily.    03/17/2018 at Unknown time  . isosorbide mononitrate (IMDUR) 30 MG 24 hr tablet Take 30 mg by mouth daily.  03/17/2018 at Unknown time  . losartan (COZAAR) 50 MG tablet Take 50 mg by mouth daily.   03/17/2018 at Unknown time  . metoprolol (LOPRESSOR) 25 MG tablet Take 1 tablet (25 mg total) by mouth 2 (two) times daily. (Patient taking differently: Take 300 mg by mouth daily. ) 60 tablet 1 03/17/2018 at 2230  . nitroGLYCERIN (NITROSTAT) 0.4 MG SL tablet Place 0.4 mg under the tongue every 5 (five) minutes as needed for chest pain. Repeat every 5 minutes up to 3 doses.  If no relief seek medical help (Do not take Cialis, Viagra or Levitra while you are on this medication)   03/15/2018 at prn  . Omega-3 Fatty Acids (FISH OIL) 1000 MG CAPS Take 2,000 mg by mouth 3 (three) times daily.    03/17/2018 at Unknown time  . oxyCODONE (OXY IR/ROXICODONE) 5 MG immediate release tablet Take 5 mg by mouth every 6 (six) hours as needed for severe pain.   03/16/2018 at prn  . pantoprazole (PROTONIX) 40 MG tablet Take 40 mg by mouth 2 (two) times daily.   03/17/2018 at Unknown time    Assessment: 62 yo man to start heparin for CP and  elevated troponin.  He has a h/o GI bleed and Hg and PTLC are low. Heparin level still undetectable this evening on 1300 units/hr. No bleeding issues noted per nursing this evening.   Goal of Therapy:  Heparin level 0.3-0.5 units/hr Monitor platelets by anticoagulation protocol: Yes   Plan:  Increase IV heparin to 1500 units/hr (continue to aim for low goal) Check heparin level in 6 hours after increase and daily while on heparin Monitor for bleeding complications  Sheppard Coil PharmD., BCPS Clinical Pharmacist 03/21/2018 5:53 PM

## 2018-03-21 NOTE — Progress Notes (Signed)
Improvement in T wave inversions in II, III and aVF

## 2018-03-21 NOTE — Consult Note (Addendum)
Cardiology Consultation:   Patient ID: [name]; [id number]; [birth year]   Admit date: 03/18/2018 Date of Consult: 03/21/2018  Primary Care Provider: Patient, No Pcp Per Primary Cardiologist: [facility]  , [place] 2014    Patient Profile:   [name] is a 62 y.o. male with a hx of CAD s/p CABG and multiple PCI, chronic combined CHF, HLD, HTN,  PVD s/p Lt FPBPG at [facility] Oct 2012, hepatitis C, bipolar disorder, gastritis with possible Barrett's and noncompliance  who is being seen today for the evaluation of NSTEMI and afib RVR  at the request of Dr. Mikey Bussing.   Extensive hx of CAD s/p CABG,  Inf STEMI in 2013 s/p angioplasty with stent SVG-RCA 3/13 (left AMA without Plavix), inferior STEMI in 09/2017 s/p PCI of the distal portion of the SVG to PDA graft. Aspiration thrombectomy and successful BMS to distal SVG to PDA graft. 3.5 x 12, posdilated to 4.1 mm. We hasn't seen him since then.   Last echo 03/2017 showed LVEF of 35-40% (this was reduced from 50-55% on 09/2014) with diffuse hypokinesis. Aortic valve is uni or bicuspid with mild aortic stenosis. RVEF is mildly decreased.  Today the patient states that he again he cardiac cath 6-7 months ago and had stent placement. He is compliant with DAPT with ASA and plavix.   History of Present Illness:   [name] presented 03/19/18 with dark ground emesis without melena or hematochezia. He is admitted for aspiration pneumonia and treated with broad spectrum antibiotics. Concern about potential alcohol and opiate use.  Treated with IV fluids for sepsis. Patient had brief episode of afib RVR overnight 3/27 that treated with metoprolol. He converted but in afib since 3/28 @10 :30 am. Rate controlled by IV metoprolol. He was also given IV lasix. Last nigh he had chest pressure with SOB. Worse with deep breath. EKG noted with change in T waves. Troponin elevated 1.07>>1.14>>0.9. Cardiology is asked for further evaluation.   He admits  intermittent chest pressure since here as well as prior to presentation. He takes lasix at home but still have orthopnea. Denies alcohol or illicit drug use. Smokes less than pack a day.   Past Medical History:  Diagnosis Date  . Acute myocardial infarction of other inferior wall, initial episode of care   . Alcohol abuse   . Anxiety   . Arthritis   . Barrett's esophagus   . Bipolar 2 disorder (HCC)   . CAD (coronary artery disease)   . CHF (congestive heart failure) (HCC)   . Coronary artery disease   . Depression   . Diverticulosis   . GERD (gastroesophageal reflux disease)   . Gout   . Hyperlipemia   . Hypertension   . MI (myocardial infarction) (HCC)   . PAD (peripheral artery disease) (HCC)    Moderate LE atherosclerosis.  . Pneumonia   . S/P right coronary artery (RCA) stent placement 03/04/2012    Past Surgical History:  Procedure Laterality Date  . CHOLECYSTECTOMY    . CORONARY ANGIOPLASTY    . CORONARY ARTERY BYPASS GRAFT    . CORONARY STENT PLACEMENT    . FEMORAL-POPLITEAL BYPASS GRAFT    . LEFT HEART CATHETERIZATION WITH CORONARY ANGIOGRAM N/A 03/04/2012   Procedure: LEFT HEART CATHETERIZATION WITH CORONARY ANGIOGRAM;  Surgeon: Runell Gess, MD;  Location: [facility] C[facility] LAB;  Service: Cardiovascular;  Laterality: N/A;  . LEFT HEART CATHETERIZATION WITH CORONARY ANGIOGRAM N/A 10/07/2013   Procedure: LEFT HEART CATHETERIZATION WITH CORONARY  ANGIOGRAM;  Surgeon: Corky Crafts, MD;  Location: [facility] CATH LAB;  Service: Cardiovascular;  Laterality: N/A;  . TOE SURGERY       Inpatient Medications: Scheduled Meds: . allopurinol  450 mg Oral Daily  . amoxicillin-clavulanate  1 tablet Oral Q12H  . atorvastatin  80 mg Oral QHS  . divalproex  1,000 mg Oral QHS  . ezetimibe  10 mg Oral Daily  . fenofibrate  160 mg Oral Daily  . furosemide  40 mg Oral Daily  . mouth rinse  15 mL Mouth Rinse BID  . metoprolol tartrate  50 mg Oral BID  . pantoprazole  40 mg Oral BID    Continuous Infusions: . heparin 1,300 Units/hr (03/21/18 1028)   PRN Meds: acetaminophen **OR** acetaminophen, diclofenac sodium, ipratropium-albuterol, nitroGLYCERIN, ondansetron (ZOFRAN) IV, oxyCODONE  Allergies:    Allergies  Allergen Reactions  . Prednisone Other (See Comments)    Causes steroid rage (takes regularly at home though)  . Lisinopril Cough    Social History:   Social History   Socioeconomic History  . Marital status: Divorced    Spouse name: Not on file  . Number of children: Not on file  . Years of education: Not on file  . Highest education level: Not on file  Occupational History  . Occupation: disabled  Social Needs  . Financial resource strain: Not on file  . Food insecurity:    Worry: Not on file    Inability: Not on file  . Transportation needs:    Medical: Not on file    Non-medical: Not on file  Tobacco Use  . Smoking status: Current Every Day Smoker    Packs/day: 0.50    Types: Cigarettes  . Smokeless tobacco: Never Used  . Tobacco comment: Uses Nicotene patch  Substance and Sexual Activity  . Alcohol use: No    Comment: former drinker stopped 6 years ago   . Drug use: No    Comment: Former Marijuana user of 4 years  . Sexual activity: Not Currently  Lifestyle  . Physical activity:    Days per week: Not on file    Minutes per session: Not on file  . Stress: Not on file  Relationships  . Social connections:    Talks on phone: Not on file    Gets together: Not on file    Attends religious service: Not on file    Active member of club or organization: Not on file    Attends meetings of clubs or organizations: Not on file    Relationship status: Not on file  . Intimate partner violence:    Fear of current or ex partner: Not on file    Emotionally abused: Not on file    Physically abused: Not on file    Forced sexual activity: Not on file  Other Topics Concern  . Not on file  Social History Narrative   Homeless Veteran.  Divorced.   + Smoker - 1ppd    Family History:    Family History  Problem Relation Age of Onset  . Heart disease Mother   . Heart disease Father   . Colon cancer Paternal Grandfather      ROS:  Please see the history of present illness.  All other ROS reviewed and negative.     Physical Exam/Data:   Vitals:   03/21/18 0438 03/21/18 0531 03/21/18 0539 03/21/18 1318  BP: (!) 144/79 120/87 116/78 121/67  Pulse: (!) 106   (!) 59  Resp: 18   19  Temp: 98.9 F (37.2 C)   97.8 F (36.6 C)  TempSrc: Oral   Oral  SpO2: 94%   98%  Weight:    164 lb 10.9 oz (74.7 kg)  Height:        Intake/Output Summary (Last 24 hours) at 03/21/2018 1415 Last data filed at 03/21/2018 1014 Gross per 24 hour  Intake 377.83 ml  Output 1675 ml  Net -1297.17 ml   Filed Weights   03/18/18 1306 03/20/18 0500 03/21/18 1318  Weight: 178 lb (80.7 kg) 184 lb 4.9 oz (83.6 kg) 164 lb 10.9 oz (74.7 kg)   Body mass index is 23.97 kg/m.  General:  Well nourished, well developed, in no acute distress HEENT: normal Lymph: no adenopathy Neck: + JVD Endocrine:  No thryomegaly Vascular: No carotid bruits; FA pulses 2+ bilaterally without bruits  Cardiac:  normal S1, S2; IR IR ; systolic murmur Lungs:  Bibasilar rales, especially in R base  Abd: soft, nontender, no hepatomegaly  Ext: no edema Musculoskeletal:  No deformities, BUE and BLE strength normal and equal Skin: warm and dry  Neuro:  CNs 2-12 intact, no focal abnormalities noted Psych:  Normal affect   EKG:  The EKG was personally reviewed and demonstrates:  Atrial fibrillation with TWI in lateral leads Telemetry:  Telemetry was personally reviewed and demonstrates:  afib at rate of 90s Relevant CV Studies: Echo 03/2017 Study Conclusions  - Left ventricle: The cavity size was mildly dilated. There was   moderate concentric hypertrophy. Systolic function was moderately   reduced. The estimated ejection fraction was in the range of 35%   to  40%. Diffuse hypokinesis. Features are consistent with a   pseudonormal left ventricular filling pattern, with concomitant   abnormal relaxation and increased filling pressure (grade 2   diastolic dysfunction). Doppler parameters are consistent with   elevated ventricular end-diastolic filling pressure. - Aortic valve: Bicuspid; severely thickened, severely calcified   leaflets. There was mild stenosis. - Ascending aorta: The ascending aorta was normal in size. - Left atrium: The atrium was moderately dilated. - Right ventricle: The cavity size was mildly dilated. Wall   thickness was normal. Systolic function was moderately reduced. - Tricuspid valve: There was trivial regurgitation. - Pulmonary arteries: Systolic pressure was within the normal   range. - Inferior vena cava: The vessel was normal in size. - Pericardium, extracardiac: There was no pericardial effusion.  Impressions:  - Since the last study on 10/08/2014 LVEF has decreased from 50-55%   to 35-40% with diffuse hypokinesis.   Aortic valve is uni or bicuspid with mild aortic stenosis.   RVEF is mildly decreased.  Cath 09/2013 ANGIOGRAPHIC DATA:   The left main coronary artery is patent.  The left anterior descending artery is a medium size vessel. In the mid vessel, there is a moderate stenosis. The distal vessel has competitive flow. The first diagonal is patent. The second diagonal has moderate to severe proximal disease.  The left circumflex artery is a large vessel. Proximally, there is a moderate stenosis. There is a large OM1 which appears to have some competitive flow in the distal portion, from a bypass graft. There is a medium-sized OM 2 which is patent..  The right coronary artery is a medium size dominant vessel. The mid vessel is occluded. The distal RCA system appears to fill by collaterals from the left system.  The LIMA to LAD appears patent.  The radial graft to OM appears  patent. There is some  mild proximal disease in the graft.  The SVG to RCA is occluded, TIMI 0 flow. This is the culprit for today's presentation. After intervention, it was noted that the proximal stent is patent but is quite undersized compared to the rest of the graft. It is difficult to tell whether this is restenosis. The midportion of the graft is very large. The PDA has some competitive flow distally. There may be some distal embolization to the small branches off of the distal PDA.  The posterolateral branch appears large and patent.  LEFT VENTRICULOGRAM:  Left ventricular angiogram was not done.  LVEDP was 15 mmHg.  PCI NARRATIVE: A JR 4 guiding catheters using age the SVG to RCA. A pro-water wire was placed across the area disease. With the wire crossing, there appeared to be some debris in the PDA which later cleared and likely went more distal. The lesion in the SVG to RCA was too distal and did not allow for a landing zone for any type of distal protection device. TIMI-3 flow was restored just with the wire. Aspiration thrombectomy was performed with aspiration catheter. A 2.5 x 12 balloon was used to predilate the 99% stenosis in the distal portion of the graft.  Intracoronary adenosine was administered. A 3.5 x 12 bare-metal, vision stent was deployed. A 4.0 x 8 noncompliant balloon was used to post dilate the stent. TIMI-3 flow was restored.  The patient's pain resolved.  IMPRESSIONS:  1. Successful BMS to distal SVG to PDA graft. 3.5 x 12, posdilated to 4.1 mm, to treat occluded vein graft causing inferior ST elevation MI. The proximal stent appears small for the size of the remainder of the vein graft. The patient did not report any exertional angina prior to this. The catheter did damp when deeply engaged in the previously stented portion proximally.    2.   LVEDP 15 mmHg.    3.    Patent LIMA to LAD. Patent radial to OM.  Occluded native mid RCA.  Moderate, diffuse circumflex/OM 2 disease. Moderate  MID native LAD disease.  RECOMMENDATION:  The patient will need dual antiplatelet therapy for at least a month. Given that he has had to ST elevation MI as in this RCA graft, would consider dual antiplatelet therapy for a longer period of time if he can tolerate this from a bleeding standpoint.  He needs to stop smoking. He needs aggressive secondary prevention. He'll be watched in the CCU. Dr. Allyson Sabal to follow in the morning and to provide followup care.   Laboratory Data:  Chemistry Recent Labs  Lab 03/19/18 0957 03/20/18 0442 03/21/18 0216  NA 134* 130* 126*  K 4.3 4.1 3.6  CL 105 100* 94*  CO2 16* 21* 19*  GLUCOSE 122* 102* 94  BUN 26* 23* 19  CREATININE 1.32* 0.99 0.96  CALCIUM 8.0* 8.3* 8.8*  GFRNONAA 56* >60 >60  GFRAA >60 >60 >60  ANIONGAP 13 9 13     Recent Labs  Lab 03/18/18 1314  PROT 6.4*  ALBUMIN 4.1  AST 19  ALT 11*  ALKPHOS 48  BILITOT 0.9   Hematology Recent Labs  Lab 03/19/18 0957 03/20/18 0442 03/21/18 0753  WBC 9.4 9.0 9.8  RBC 3.41* 3.22* 3.44*  HGB 9.3* 8.6* 8.7*  HCT 28.9* 26.4* 27.6*  MCV 84.8 82.0 80.2  MCH 27.3 26.7 25.3*  MCHC 32.2 32.6 31.5  RDW 16.6* 16.6* 16.4*  PLT 122* 123* 148*   Cardiac Enzymes Recent Labs  Lab 03/20/18 2255 03/21/18 0216 03/21/18 0753  TROPONINI 1.07* 1.14* 0.90*   No results for input(s): TROPIPOC in the last 168 hours.   Radiology/Studies:  Dg Chest 2 View  Result Date: 03/18/2018 CLINICAL DATA:  Weakness, cough EXAM: CHEST - 2 VIEW COMPARISON:  10/07/2013 FINDINGS: Right upper lobe and lower lobe interstitial and alveolar airspace opacities. No pleural effusion or pneumothorax. Stable cardiomediastinal silhouette. Prior CABG. No acute osseous abnormality. Mild osteoarthritis of the right glenohumeral joint. IMPRESSION: Right upper lobe and lower lobe interstitial and alveolar airspace opacities which may reflect multi lobar pneumonia versus asymmetric pulmonary edema. Electronically Signed   By: Elige KoHetal   Patel   On: 03/18/2018 14:14    Assessment and Plan:   1. NSTEMI with hx of CAD s/p CABG and multiple PCI - The patient states that he had last cath few months @ [facility] with stenting. He is compliant with DAPT.  - Troponin elevated 1.07>>1.14>>0.9. - IV heparin started this morning without bolus.   2. New onset atrial fibrillation with rapid ventricular rate - Rate relatively stable but intermittently elevates. Continue BB. CHADSVASC score of 3 for HTN, CHF and vascular dz. ? Long term anticoagulation warrants give hx of gastritis and prior multiple EGDs.   3. Acute on chronic combined CHF - Last echo 03/2017 showed LVEF of 35-40% (this was reduced from 50-55% on 09/2014) with diffuse hypokinesis. Aortic valve is uni or bicuspid with mild aortic stenosis. RVEF is mildly decreased. - [facility]  - He is volume overloaded on exam. He has received IV fluids for sepsis during admission.   4. AKI - Scr was 2.41 on admission. Normalized after hydration.   5. HTN - BP stable on current medications  Will review with MD. He has already eaten. Will request records from [facility].   His hemoglobin is down to 8.7 from 10 at admission. He will need GI evaluation prior to any cardiac cath. Apart from this he will need anticoagulation in addition to his antiplatelet therapy. Check stool guiac. Recommended GI consult. Consider increasing metoprolol. BP stable. Continue lasix, consider up titration. Get echo. Strict I & O and daily weight.     For questions or updates, please contact [facility] Please consult [email or web address] for contact info under Cardiology/STEMI.   Vonzella NippleSigned, Bhavinkumar PlandomeBhagat, [place]  03/21/2018 2:15 PM   Attending Note:   The patient was seen and examined.  Agree with assessment and plan as noted above.  Changes made to the above note as needed.  Patient seen and independently examined with  Chelsea AusVin Bhagat, PA .   We discussed all aspects of the encounter. I  agree with the assessment and plan as stated above.  Very complex patient with multiple issues.  1.  Atrial fibrillation: The patient now presents with paroxysmal atrial fibrillation.  His rate is a little rapid.  This rapid rate may be because of his anemia, ongoing diffuse pain, pneumonia.  We will increase his metoprolol.  He will ultimately need to be started on Eliquis but he was admitted with coffee-ground emesis and has a long hematocrit.  I do not think we can safely put him on Eliquis at this point.  He is on heparin for the time being. I think that he needs to be seen by gastroenterology for possible EGD so that we can safely put him on the anticoagulation and antiplatelet medicines that he needs.  2.  Non-ST segment elevation myocardial infarction.: The patient  has a known history of coronary artery disease and apparently had a stent placed 6 months ago.  He was on aspirin and Plavix but these have been held for the past several days since he presented with coffee-ground emesis.  We need a GI evaluation fairly quickly so that we can know whether or not to restart his Plavix and aspirin.  In addition, he had some episodes of chest pain and now has a troponin level of 1.14.  He will need a repeat heart catheterization during this hospitalization.  3.  Chronic combined systolic and diastolic congestive heart failure: Most recent echocardiogram from April, 2018 reveals LVEF of 35-40%.  This is decreased from his previous ejection fraction of 50-55%.  He is received a lot of IV fluids and this reduction of LV function may be due to his excess fluids.  We will repeat his echocardiogram.  He should have Lasix on a regular basis.  4.  Anemia: His hemoglobin has fallen from 10 down to 8.7.  He was admitted with coffee-ground emesis.  Recommend GI consultation.  5.  Aspiration pneumonia: He has rales in his right base.  The thought is that he has aspiration pneumonia-presumably because of his  excessive nausea and vomiting.  Further plans per internal medicine.   I have spent a total of 40 minutes with patient reviewing hospital  notes , telemetry, EKGs, labs and examining patient as well as establishing an assessment and plan that was discussed with the patient. > 50% of time was spent in direct patient care.    Vesta Mixer, Montez Hageman., MD, [facility] 03/21/2018, 4:12 PM [address]  [address] Office [phone] Pager [phone]

## 2018-03-22 ENCOUNTER — Inpatient Hospital Stay (HOSPITAL_COMMUNITY): Payer: Non-veteran care

## 2018-03-22 ENCOUNTER — Encounter (HOSPITAL_COMMUNITY): Payer: Self-pay | Admitting: Certified Registered"

## 2018-03-22 ENCOUNTER — Encounter (HOSPITAL_COMMUNITY)
Admission: EM | Disposition: A | Discharge status: Home / Self Care | Payer: Self-pay | Source: Home / Self Care | Attending: Internal Medicine

## 2018-03-22 ENCOUNTER — Encounter (HOSPITAL_COMMUNITY): Payer: Self-pay

## 2018-03-22 DIAGNOSIS — K920 Hematemesis: Secondary | ICD-10-CM

## 2018-03-22 DIAGNOSIS — D649 Anemia, unspecified: Secondary | ICD-10-CM

## 2018-03-22 DIAGNOSIS — M25519 Pain in unspecified shoulder: Secondary | ICD-10-CM

## 2018-03-22 DIAGNOSIS — K449 Diaphragmatic hernia without obstruction or gangrene: Secondary | ICD-10-CM

## 2018-03-22 DIAGNOSIS — I5022 Chronic systolic (congestive) heart failure: Secondary | ICD-10-CM

## 2018-03-22 HISTORY — PX: ESOPHAGOGASTRODUODENOSCOPY (EGD) WITH PROPOFOL: SHX5813

## 2018-03-22 LAB — BASIC METABOLIC PANEL
Anion gap: 12 (ref 5–15)
BUN: 21 mg/dL — AB (ref 6–20)
CO2: 22 mmol/L (ref 22–32)
CREATININE: 0.82 mg/dL (ref 0.61–1.24)
Calcium: 8.6 mg/dL — ABNORMAL LOW (ref 8.9–10.3)
Chloride: 98 mmol/L — ABNORMAL LOW (ref 101–111)
GFR calc non Af Amer: >60 mL/min (ref >60)
Glucose, Bld: 87 mg/dL (ref 65–99)
Potassium: 3.2 mmol/L — ABNORMAL LOW (ref 3.5–5.1)
Sodium: 132 mmol/L — ABNORMAL LOW (ref 135–145)

## 2018-03-22 LAB — HEPARIN LEVEL (UNFRACTIONATED): Heparin Unfractionated: 0.16 IU/mL — ABNORMAL LOW (ref 0.30–0.70)

## 2018-03-22 LAB — CBC
HCT: 28.2 % — ABNORMAL LOW (ref 39.0–52.0)
HCT: 29.5 % — ABNORMAL LOW (ref 39.0–52.0)
Hemoglobin: 9.2 g/dL — ABNORMAL LOW (ref 13.0–17.0)
Hemoglobin: 9.6 g/dL — ABNORMAL LOW (ref 13.0–17.0)
MCH: 25.8 pg — ABNORMAL LOW (ref 26.0–34.0)
MCH: 26.2 pg (ref 26.0–34.0)
MCHC: 32.5 g/dL (ref 30.0–36.0)
MCHC: 32.6 g/dL (ref 30.0–36.0)
MCV: 79.2 fL (ref 78.0–100.0)
MCV: 80.6 fL (ref 78.0–100.0)
PLATELETS: 167 10*3/uL (ref 150–400)
PLATELETS: 143 10*3/uL — AB (ref 150–400)
RBC: 3.56 MIL/uL — ABNORMAL LOW (ref 4.22–5.81)
RBC: 3.66 MIL/uL — AB (ref 4.22–5.81)
RDW: 16.4 % — AB (ref 11.5–15.5)
RDW: 16.3 % — AB (ref 11.5–15.5)
WBC: 5.7 10*3/uL (ref 4.0–10.5)
WBC: 9.1 10*3/uL (ref 4.0–10.5)

## 2018-03-22 LAB — MAGNESIUM: Magnesium: 2 mg/dL (ref 1.7–2.4)

## 2018-03-22 SURGERY — ESOPHAGOGASTRODUODENOSCOPY (EGD) WITH PROPOFOL
Anesthesia: Monitor Anesthesia Care

## 2018-03-22 MED ORDER — MIDAZOLAM HCL 5 MG/ML IJ SOLN
INTRAMUSCULAR | Status: AC
  Filled 2018-03-22: qty 3

## 2018-03-22 MED ORDER — METOPROLOL TARTRATE 5 MG/5ML IV SOLN
5.0000 mg | Freq: Once | INTRAVENOUS | Status: AC
  Administered 2018-03-22: 5 mg via INTRAVENOUS
  Filled 2018-03-22: qty 5

## 2018-03-22 MED ORDER — METOPROLOL TARTRATE 100 MG PO TABS
100.0000 mg | ORAL_TABLET | Freq: Two times a day (BID) | ORAL | Status: DC
  Administered 2018-03-22 – 2018-03-25 (×6): 100 mg via ORAL
  Filled 2018-03-22 (×6): qty 1

## 2018-03-22 MED ORDER — MIDAZOLAM HCL 5 MG/5ML IJ SOLN
INTRAMUSCULAR | Status: DC | PRN
  Administered 2018-03-22 (×3): 2 mg via INTRAVENOUS

## 2018-03-22 MED ORDER — DIPHENHYDRAMINE HCL 50 MG/ML IJ SOLN
INTRAMUSCULAR | Status: AC
  Filled 2018-03-22: qty 1

## 2018-03-22 MED ORDER — BUTAMBEN-TETRACAINE-BENZOCAINE 2-2-14 % EX AERO
INHALATION_SPRAY | CUTANEOUS | Status: DC | PRN
  Administered 2018-03-22: 2 via TOPICAL

## 2018-03-22 MED ORDER — SODIUM CHLORIDE 0.9 % IV SOLN: INTRAVENOUS | Status: DC

## 2018-03-22 MED ORDER — FENTANYL CITRATE (PF) 100 MCG/2ML IJ SOLN
INTRAMUSCULAR | Status: DC | PRN
  Administered 2018-03-22 (×3): 25 ug via INTRAVENOUS

## 2018-03-22 MED ORDER — HEPARIN (PORCINE) IN NACL 100-0.45 UNIT/ML-% IJ SOLN
2000.0000 [IU]/h | INTRAMUSCULAR | Status: DC
  Administered 2018-03-23 – 2018-03-24 (×3): 2000 [IU]/h via INTRAVENOUS
  Filled 2018-03-22 (×3): qty 250

## 2018-03-22 MED ORDER — POTASSIUM CHLORIDE CRYS ER 20 MEQ PO TBCR
60.0000 meq | EXTENDED_RELEASE_TABLET | Freq: Two times a day (BID) | ORAL | Status: AC
  Administered 2018-03-22 (×2): 60 meq via ORAL
  Filled 2018-03-22 (×2): qty 3

## 2018-03-22 MED ORDER — FENTANYL CITRATE (PF) 100 MCG/2ML IJ SOLN
INTRAMUSCULAR | Status: AC
  Filled 2018-03-22: qty 4

## 2018-03-22 SURGICAL SUPPLY — 15 items

## 2018-03-22 NOTE — Progress Notes (Signed)
ANTICOAGULATION CONSULT NOTE - Follow up  Pharmacy Consult for Heparin Indication: chest pain/ACS  Allergies  Allergen Reactions  . Prednisone Other (See Comments)    Causes steroid rage (takes regularly at home though)  . Lisinopril Cough    Patient Measurements: Height: 5' 9.5" (176.5 cm) Weight: 164 lb 10.9 oz (74.7 kg) IBW/kg (Calculated) : 71.85 Heparin Dosing Weight: 83.6 kg  Vital Signs: Temp: 99.4 F (37.4 C) (03/29 2126) Temp Source: Oral (03/29 2126) BP: 106/94 (03/29 2126) Pulse Rate: 101 (03/29 2126)  Labs: Recent Labs    03/19/18 0957 03/20/18 0442  03/21/18 0216 03/21/18 0753 03/21/18 1443 03/21/18 1613 03/21/18 2338  HGB 9.3* 8.6*  --   --  8.7*  --   --  9.6*  HCT 28.9* 26.4*  --   --  27.6*  --   --  29.5*  PLT 122* 123*  --   --  148*  --   --  143*  HEPARINUNFRC  --   --   --   --  <0.10*  --  <0.10* <0.10*  CREATININE 1.32* 0.99  --  0.96  --   --   --   --   TROPONINI  --   --    < > 1.14* 0.90* 0.70*  --   --    < > = values in this interval not displayed.    Estimated Creatinine Clearance: 80.1 mL/min (by C-G formula based on SCr of 0.96 mg/dL).   Medical History: Past Medical History:  Diagnosis Date  . Acute myocardial infarction of other inferior wall, initial episode of care   . Alcohol abuse   . Anxiety   . Arthritis   . Barrett's esophagus   . Bipolar 2 disorder (HCC)   . CAD (coronary artery disease)   . CHF (congestive heart failure) (HCC)   . Coronary artery disease   . Depression   . Diverticulosis   . GERD (gastroesophageal reflux disease)   . Gout   . Hyperlipemia   . Hypertension   . MI (myocardial infarction) (HCC)   . PAD (peripheral artery disease) (HCC)    Moderate LE atherosclerosis.  . Pneumonia   . S/P right coronary artery (RCA) stent placement 03/04/2012    Medications:  Medications Prior to Admission  Medication Sig Dispense Refill Last Dose  . acetaminophen (TYLENOL) 500 MG tablet Take 500 mg by  mouth every 6 (six) hours as needed.   unknown at prn  . albuterol (PROVENTIL HFA;VENTOLIN HFA) 108 (90 BASE) MCG/ACT inhaler Inhale 2 puffs into the lungs every 6 (six) hours as needed. WHEEZING   03/15/2018 at prn  . allopurinol (ZYLOPRIM) 100 MG tablet Take 450 mg by mouth daily.    03/17/2018 at Unknown time  . aspirin EC 81 MG tablet Take 81 mg by mouth daily.   03/17/2018 at Unknown time  . atorvastatin (LIPITOR) 80 MG tablet Take 80 mg by mouth at bedtime.    03/17/2018 at Unknown time  . clopidogrel (PLAVIX) 75 MG tablet Take 75 mg by mouth daily.   03/17/2018 at Unknown time  . divalproex (DEPAKOTE) 500 MG DR tablet Take 1,000 mg by mouth at bedtime.    03/17/2018 at Unknown time  . ezetimibe (ZETIA) 10 MG tablet Take 10 mg by mouth daily.   03/17/2018 at Unknown time  . fenofibrate (TRICOR) 145 MG tablet Take 145 mg by mouth daily.    03/17/2018 at Unknown time  . isosorbide mononitrate (IMDUR)  30 MG 24 hr tablet Take 30 mg by mouth daily.   03/17/2018 at Unknown time  . losartan (COZAAR) 50 MG tablet Take 50 mg by mouth daily.   03/17/2018 at Unknown time  . metoprolol (LOPRESSOR) 25 MG tablet Take 1 tablet (25 mg total) by mouth 2 (two) times daily. (Patient taking differently: Take 300 mg by mouth daily. ) 60 tablet 1 03/17/2018 at 2230  . nitroGLYCERIN (NITROSTAT) 0.4 MG SL tablet Place 0.4 mg under the tongue every 5 (five) minutes as needed for chest pain. Repeat every 5 minutes up to 3 doses.  If no relief seek medical help (Do not take Cialis, Viagra or Levitra while you are on this medication)   03/15/2018 at prn  . Omega-3 Fatty Acids (FISH OIL) 1000 MG CAPS Take 2,000 mg by mouth 3 (three) times daily.    03/17/2018 at Unknown time  . oxyCODONE (OXY IR/ROXICODONE) 5 MG immediate release tablet Take 5 mg by mouth every 6 (six) hours as needed for severe pain.   03/16/2018 at prn  . pantoprazole (PROTONIX) 40 MG tablet Take 40 mg by mouth 2 (two) times daily.   03/17/2018 at Unknown time     Assessment: 62 yo man to start heparin for CP and elevated troponin.  He has a h/o GI bleed and Hgb and PTLC are low. Heparin level still undetectable this evening on 1500 units/hr. No overt bleeding noted.   Goal of Therapy:  Heparin level 0.3-0.5 units/mL Monitor platelets by anticoagulation protocol: Yes   Plan:  Increase IV heparin to 1700 units/hr (continue to aim for low goal) 0900 HL  Abran Duke, PharmD, BCPS Clinical Pharmacist Phone: [phone]

## 2018-03-22 NOTE — Progress Notes (Signed)
Progress Note  Patient Name: [name] Date of Encounter: 03/22/2018  Primary Cardiologist:  [facility]  , [place] [facility]     Subjective   No CP, no SOB. Looks fairly comfortable in bed.   Inpatient Medications    Scheduled Meds: . allopurinol  450 mg Oral Daily  . atorvastatin  80 mg Oral QHS  . divalproex  1,000 mg Oral QHS  . ezetimibe  10 mg Oral Daily  . fenofibrate  160 mg Oral Daily  . furosemide  40 mg Oral Daily  . mouth rinse  15 mL Mouth Rinse BID  . metoprolol tartrate  75 mg Oral BID  . pantoprazole  40 mg Oral BID   Continuous Infusions: . heparin 1,700 Units/hr (03/22/18 0103)   PRN Meds: acetaminophen **OR** acetaminophen, diclofenac sodium, ipratropium-albuterol, nitroGLYCERIN, ondansetron (ZOFRAN) IV, oxyCODONE   Vital Signs    Vitals:   03/21/18 1318 03/21/18 2126 03/22/18 0348 03/22/18 0601  BP: 121/67 (!) 106/94 109/75 104/68  Pulse: (!) 59 (!) 101  (!) 57  Resp: 19 18  18   Temp: 97.8 F (36.6 C) 99.4 F (37.4 C)  98.2 F (36.8 C)  TempSrc: Oral Oral  Oral  SpO2: 98% 97%  98%  Weight: 164 lb 10.9 oz (74.7 kg)   167 lb (75.8 kg)  Height:        Intake/Output Summary (Last 24 hours) at 03/22/2018 1115 Last data filed at 03/22/2018 1055 Gross per 24 hour  Intake 437.18 ml  Output 2425 ml  Net -1987.82 ml   Filed Weights   03/20/18 0500 03/21/18 1318 03/22/18 0601  Weight: 184 lb 4.9 oz (83.6 kg) 164 lb 10.9 oz (74.7 kg) 167 lb (75.8 kg)    Telemetry    AFIb 120's - Personally Reviewed  ECG     AFIB 102- Personally Reviewed  Physical Exam   GEN: No acute distress.   Neck: No JVD Cardiac: irreg irreg, 1/6 SM, no rubs, or gallops.  Respiratory: R base crackles. GI: Soft, nontender, non-distended  MS: No edema; No deformity. Neuro:  Nonfocal  Psych: Normal affect   Labs    Chemistry Recent Labs  Lab 03/18/18 1314 03/19/18 0957 03/20/18 0442 03/21/18 0216  NA 136 134* 130* 126*  K 4.4 4.3 4.1 3.6  CL 104 105  100* 94*  CO2 20* 16* 21* 19*  GLUCOSE 78 122* 102* 94  BUN 21* 26* 23* 19  CREATININE 2.41* 1.32* 0.99 0.96  CALCIUM 9.0 8.0* 8.3* 8.8*  PROT 6.4*  --   --   --   ALBUMIN 4.1  --   --   --   AST 19  --   --   --   ALT 11*  --   --   --   ALKPHOS 48  --   --   --   BILITOT 0.9  --   --   --   GFRNONAA 27* 56* >60 >60  GFRAA 31* >60 >60 >60  ANIONGAP 12 13 9 13      Hematology Recent Labs  Lab 03/21/18 0753 03/21/18 2338 03/22/18 1036  WBC 9.8 9.1 5.7  RBC 3.44* 3.66* 3.56*  HGB 8.7* 9.6* 9.2*  HCT 27.6* 29.5* 28.2*  MCV 80.2 80.6 79.2  MCH 25.3* 26.2 25.8*  MCHC 31.5 32.5 32.6  RDW 16.4* 16.3* 16.4*  PLT 148* 143* 167    Cardiac Enzymes Recent Labs  Lab 03/20/18 2255 03/21/18 0216 03/21/18 0753 03/21/18 1443  TROPONINI 1.07* 1.14* 0.90* 0.70*   No results for input(s): TROPIPOC in the last 168 hours.   BNPNo results for input(s): BNP, PROBNP in the last 168 hours.   DDimer No results for input(s): DDIMER in the last 168 hours.   Radiology    No results found.  Cardiac Studies   03/27/17: ECHO  - Since the last study on 10/08/2014 LVEF has decreased from 50-55%   to 35-40% with diffuse hypokinesis.   Aortic valve is bicuspid with mild aortic stenosis.   RVEF is mildly decreased.   Patient Profile     62 y.o. male admitted with coffee-ground emesis with recent PCI, CAD post CABG  Assessment & Plan    Paroxysmal atrial fibrillation -Metoprolol increased yesterday.  Not placed on anticoagulation because of GI bleed.  Non-ST elevation myocardial infarction, CAD post CABG -Troponin is trending downward was 1.1.  Had a stent placed approximately 6 months ago.  Aspirin and Plavix have been held the past several days following his coffee-ground emesis. -Cardiac catheterization, diagnostic during this hospitalization may be helpful.  Systolic chronic heart failure -April 2018 EF 35-40%.  Repeat echocardiogram pending.  Continue with Lasix.  Anemia  from blood loss? - GI consultation, coffee-ground emesis, EGD today. I stopped heparin.  ? When safe to anticoagulate.   Aspiration pneumonia - Rales at right base, vomiting  AK I -Improved with resuscitation.  PAD post fem pop. Stable.   Bicuspid AV  - mild aortic stenosis, murmur  Complex medical issues.   For questions or updates, please contact [facility] Please consult [email or web address] for contact info under Cardiology/STEMI.      Signed, Donato Schultz, MD  03/22/2018, 11:15 AM

## 2018-03-22 NOTE — H&P (View-Only) (Signed)
Consultation  Referring Provider: Dr. Elease Hashimoto     Primary Care Physician:  Patient, No Pcp Per Primary Gastroenterologist: Dr. Russella Dar       Reason for Consultation:   Anemia in setting of coffee-ground emesis         HPI:   [name] is a 62 y.o. male with a past medical history of CAD status post CABG and multiple PCI, chronic combined CHF, hyperlipidemia, hypertension, PVD, hepatitis C, bipolar disorder and gastritis with possible Barrett's and noncompliance who presented to the ER 03/18/18 with a history of nausea and vomiting.  We are consulted today by cardiology for anemia.    See cardiology's extensive note regarding cardiac history.  Last echo 03/2017 and LVEF of 35-40%.  Last cardiac cath 6-7 months ago with stent placement.  Compliant with aspirin and Plavix.  Diagnosed with a end STEMI with history of CAD status post CABG and multiple PCI, started on IV heparin 03/21/18.  New onset A. fib with rapid ventricular rate.  Acute on chronic combined CHF.  AKI with a creatinine 2.41 on admission, normalized after hydration.  His hemoglobin dropped to 8.7 from 10 and admission and GI eval is recommended prior to cardiac cath.    Today, explains that on 03/18/18 he awoke with nausea and vomiting of a "coffee ground black material". Describes vomiting uncontrollably for 20 min. Does admit to Advil use 4-6 tabs qd for the week before for a shoulder pain. Since admission only small amount of nausea and no further vomiting. Is on a BID PPI at home for history of reflux. Denies abdominal pain. No melena.   Briefly discusses h/o Barrett's esophagus, but no reported EGD since 2014 due to multiple stent placements and procedure being delayed due to recent addition of blood thinners.    Denies fever, chills, weight loss, anorexia, dysphagia, abdominal pain or symptoms that awaken him at night.   GI history: 10/02/17-office visit, Amy Esterwood: Seen in regards to surveillance endoscopy with history of  Barrett's esophagus, previous serial endoscopies and colonoscopies under [facility] with a history of colon polyps; that time procedures held off as patient had just underwent coronary stent placement 08/23/17 and we were requesting records from during [place] -colonoscopy May 2001 Dr. Loreta Ave showed left-sided diverticular disease, few right-sided diverticuli no polyps -upper endoscopy November 2001 per Dr. Loreta Ave small hiatal hernia and mild antral gastritis, EGD August 2005 Dr. Loreta Ave, grade 1 distal esophagitis, 3-4 cm hiatal hernia and antral erosions diffuse gastritis - Colonoscopy October 2005 Dr. Loreta Ave small internal hemorrhoids scattered pandiverticulosis no masses or polyps and large amount of residual stool in the colon.  Past Medical History:  Diagnosis Date  . Acute myocardial infarction of other inferior wall, initial episode of care   . Alcohol abuse   . Anxiety   . Arthritis   . Barrett's esophagus   . Bipolar 2 disorder (HCC)   . CAD (coronary artery disease)   . CHF (congestive heart failure) (HCC)   . Coronary artery disease   . Depression   . Diverticulosis   . GERD (gastroesophageal reflux disease)   . Gout   . Hyperlipemia   . Hypertension   . MI (myocardial infarction) (HCC)   . PAD (peripheral artery disease) (HCC)    Moderate LE atherosclerosis.  . Pneumonia   . S/P right coronary artery (RCA) stent placement 03/04/2012    Past Surgical History:  Procedure Laterality Date  . CHOLECYSTECTOMY    . CORONARY  ANGIOPLASTY    . CORONARY ARTERY BYPASS GRAFT    . CORONARY STENT PLACEMENT    . FEMORAL-POPLITEAL BYPASS GRAFT    . LEFT HEART CATHETERIZATION WITH CORONARY ANGIOGRAM N/A 03/04/2012   Procedure: LEFT HEART CATHETERIZATION WITH CORONARY ANGIOGRAM;  Surgeon: Runell Gess, MD;  Location: [facility] CATH LAB;  Service: Cardiovascular;  Laterality: N/A;  . LEFT HEART CATHETERIZATION WITH CORONARY ANGIOGRAM N/A 10/07/2013   Procedure: LEFT HEART CATHETERIZATION WITH CORONARY  ANGIOGRAM;  Surgeon: Corky Crafts, MD;  Location: [facility] CATH LAB;  Service: Cardiovascular;  Laterality: N/A;  . TOE SURGERY      Family History  Problem Relation Age of Onset  . Heart disease Mother   . Heart disease Father   . Colon cancer Paternal Grandfather     Social History   Tobacco Use  . Smoking status: Current Every Day Smoker    Packs/day: 0.50    Types: Cigarettes  . Smokeless tobacco: Never Used  . Tobacco comment: Uses Nicotene patch  Substance Use Topics  . Alcohol use: No    Comment: former drinker stopped 6 years ago   . Drug use: No    Comment: Former Marijuana user of 4 years    Prior to Admission medications   Medication Sig Start Date End Date Taking? Authorizing Provider  acetaminophen (TYLENOL) 500 MG tablet Take 500 mg by mouth every 6 (six) hours as needed.   Yes [provider]  albuterol (PROVENTIL HFA;VENTOLIN HFA) 108 (90 BASE) MCG/ACT inhaler Inhale 2 puffs into the lungs every 6 (six) hours as needed. WHEEZING   Yes [provider]  allopurinol (ZYLOPRIM) 100 MG tablet Take 450 mg by mouth daily.    Yes [provider]  aspirin EC 81 MG tablet Take 81 mg by mouth daily.   Yes [provider]  atorvastatin (LIPITOR) 80 MG tablet Take 80 mg by mouth at bedtime.    Yes [provider]  clopidogrel (PLAVIX) 75 MG tablet Take 75 mg by mouth daily.   Yes [provider]  divalproex (DEPAKOTE) 500 MG DR tablet Take 1,000 mg by mouth at bedtime.    Yes [provider]  ezetimibe (ZETIA) 10 MG tablet Take 10 mg by mouth daily.   Yes [provider]  fenofibrate (TRICOR) 145 MG tablet Take 145 mg by mouth daily.    Yes [provider]  isosorbide mononitrate (IMDUR) 30 MG 24 hr tablet Take 30 mg by mouth daily.   Yes [provider]  losartan (COZAAR) 50 MG tablet Take 50 mg by mouth daily.   Yes [provider]  metoprolol (LOPRESSOR) 25 MG tablet Take 1  tablet (25 mg total) by mouth 2 (two) times daily. Patient taking differently: Take 300 mg by mouth daily.  03/28/17  Yes Vassie Loll, MD  nitroGLYCERIN (NITROSTAT) 0.4 MG SL tablet Place 0.4 mg under the tongue every 5 (five) minutes as needed for chest pain. Repeat every 5 minutes up to 3 doses.  If no relief seek medical help (Do not take Cialis, Viagra or Levitra while you are on this medication)   Yes [provider]  Omega-3 Fatty Acids (FISH OIL) 1000 MG CAPS Take 2,000 mg by mouth 3 (three) times daily.    Yes [provider]  oxyCODONE (OXY IR/ROXICODONE) 5 MG immediate release tablet Take 5 mg by mouth every 6 (six) hours as needed for severe pain.   Yes [provider]  pantoprazole (PROTONIX) 40  MG tablet Take 40 mg by mouth 2 (two) times daily.   Yes [provider]    Current Facility-Administered Medications  Medication Dose Route Frequency Provider Last Rate Last Dose  . acetaminophen (TYLENOL) tablet 650 mg  650 mg Oral Q6H PRN Nyra Market, MD   650 mg at 03/19/18 4098   Or  . acetaminophen (TYLENOL) suppository 650 mg  650 mg Rectal Q6H PRN Nyra Market, MD      . allopurinol (ZYLOPRIM) tablet 450 mg  450 mg Oral Daily Nyra Market, MD   450 mg at 03/21/18 0953  . atorvastatin (LIPITOR) tablet 80 mg  80 mg Oral QHS Nyra Market, MD   80 mg at 03/21/18 2234  . diclofenac sodium (VOLTAREN) 1 % transdermal gel 2 g  2 g Topical QID PRN Angelita Ingles, MD   2 g at 03/22/18 0345  . divalproex (DEPAKOTE) DR tablet 1,000 mg  1,000 mg Oral QHS Nyra Market, MD   1,000 mg at 03/21/18 2234  . ezetimibe (ZETIA) tablet 10 mg  10 mg Oral Daily Nyra Market, MD   10 mg at 03/21/18 0954  . fenofibrate tablet 160 mg  160 mg Oral Daily Nyra Market, MD   160 mg at 03/21/18 0954  . furosemide (LASIX) tablet 40 mg  40 mg Oral Daily Nyra Market, MD   40 mg at 03/21/18 0953  . heparin ADULT infusion 100 units/mL (25000 units/213mL  sodium chloride 0.45%)  1,700 Units/hr Intravenous Continuous Stevphen Rochester, RPH 17 mL/hr at 03/22/18 0103 1,700 Units/hr at 03/22/18 0103  . ipratropium-albuterol (DUONEB) 0.5-2.5 (3) MG/3ML nebulizer solution 3 mL  3 mL Nebulization Q6H PRN Carlynn Purl C, DO      . MEDLINE mouth rinse  15 mL Mouth Rinse BID Carlynn Purl C, DO   15 mL at 03/21/18 2234  . metoprolol tartrate (LOPRESSOR) tablet 75 mg  75 mg Oral BID Bhagat, Bhavinkumar, PA   75 mg at 03/21/18 2233  . nitroGLYCERIN (NITROSTAT) SL tablet 0.4 mg  0.4 mg Sublingual Q5 min PRN Angelita Ingles, MD      . ondansetron [facility]) injection 4 mg  4 mg Intravenous Q8H PRN Nyra Market, MD      . oxyCODONE (Oxy IR/ROXICODONE) immediate release tablet 5 mg  5 mg Oral Q12H PRN Scherrie Gerlach, MD   5 mg at 03/22/18 0419  . pantoprazole (PROTONIX) EC tablet 40 mg  40 mg Oral BID Hammons, Kimberly B, RPH   40 mg at 03/21/18 2234    Allergies as of 03/18/2018 - Review Complete 03/18/2018  Allergen Reaction Noted  . Prednisone Other (See Comments) 12/20/2011  . Lisinopril Cough 03/26/2017     Review of Systems:    Constitutional: No weight loss, fever or chills Skin: No rash  Cardiovascular: + chest pressure  Respiratory: No SOB Gastrointestinal: See HPI and otherwise negative Genitourinary: No dysuria Neurological: No headache Musculoskeletal: No new muscle or joint pain Hematologic: No bruising Psychiatric: No history of depression or anxiety   Physical Exam:  Vital signs in last 24 hours: Temp:  [97.8 F (36.6 C)-99.4 F (37.4 C)] 98.2 F (36.8 C) (03/30 0601) Pulse Rate:  [57-101] 57 (03/30 0601) Resp:  [18-19] 18 (03/30 0601) BP: (104-121)/(67-94) 104/68 (03/30 0601) SpO2:  [97 %-98 %] 98 % (03/30 0601) Weight:  [164 lb 10.9 oz (74.7 kg)-167 lb (75.8 kg)] 167 lb (75.8 kg) (03/30 0601) Last BM Date: 03/21/18 General:   Pleasant Caucasian male appears  to be in NAD, Well developed, Well nourished, alert and  cooperative Head:  Normocephalic and atraumatic. Eyes:   PEERL, EOMI. No icterus. Conjunctiva pink. Ears:  Normal auditory acuity. Neck:  Supple +JVD Throat: Oral cavity and pharynx without inflammation, swelling or lesion.  Lungs: Respirations even and unlabored. Bibasilar rales Heart: Normal S1, S2. +systolic murmur, irregularly irregular rhythm, No peripheral edema, cyanosis or pallor.  Abdomen:  Soft, nondistended, nontender. No rebound or guarding. Normal bowel sounds. No appreciable masses or hepatomegaly. Rectal:  Not performed.  Msk:  Symmetrical without gross deformities. Peripheral pulses intact.  Extremities:  Without edema, no deformity or joint abnormality. Neurologic:  Alert and  oriented x4;  grossly normal neurologically.  Skin:   Dry and intact without significant lesions or rashes. Psychiatric: Demonstrates good judgement and reason without abnormal affect or behaviors.   LAB RESULTS: Recent Labs    03/20/18 0442 03/21/18 0753 03/21/18 2338  WBC 9.0 9.8 9.1  HGB 8.6* 8.7* 9.6*  HCT 26.4* 27.6* 29.5*  PLT 123* 148* 143*   BMET Recent Labs    03/20/18 0442 03/21/18 0216  NA 130* 126*  K 4.1 3.6  CL 100* 94*  CO2 21* 19*  GLUCOSE 102* 94  BUN 23* 19  CREATININE 0.99 0.96  CALCIUM 8.3* 8.8*     Impression / Plan:   Impression: 1.  A. fib: New paroxysmal A. fib, RVR, following with cardiology 2.  Anemia in the setting of coffee-ground emesis: Hemoglobin 10-->8.7 with history of coffee-ground emesis prior to admission, with NSAID use; Concern for peptic disease 3.  Aspiration pneumonia: Rales in his right lung base, thought aspiration pneumonia, from excessive nausea and vomiting 4.  Chronic combined systolic and diastolic CHF: Most recent echo with LVEF 35-40% April 2018 5.  Non-ST segment elevation myocardial infarction: Known CAD, stent placed 6 months ago in [place], on aspirin and Plavix, held for the past few days with coffee-ground emesis, patient  is requiring repeat heart cath  Plan: 1.  EGD today with Dr. Christella HartiganJacobs.  Did discuss risk, benefits, limitations alternatives and the patient agrees to proceed. 2.  Continue other supportive measures with monitoring of hemoglobin and transfusion as needed 3. Patient strict NPO until after time of procedure 4. Agree with BID PPI 5.  Please await further recommendations from Dr. Christella HartiganJacobs after time procedure.  Thank you for your kind consultation, we will continue to follow.  Violet BaldyJennifer Lynne Lemmon  03/22/2018, 10:14 AM Pager #: [phone]   ________________________________________________________________________  [facility] MD note:  I personally examined the patient, reviewed the data and agree with the assessment and plan described above.  EGD this afternoon for coffee ground hematemesis, upcoming cardiac cath.  He's on heparin, ASA and plavix and so I do not plan on biopsying any routine, non-nodular Barrett's appearing mucosa in this acute setting.  Given daily ASA use and recent overuse of NSAIDs (6 advil a day for 1-2 weeks) he may have gastritis, PUD.     Rob Buntinganiel Jacobs, MD [facility] Pager [phone]

## 2018-03-22 NOTE — Consult Note (Addendum)
Consultation  Referring Provider: Dr. Elease Hashimoto     Primary Care Physician:  Patient, No Pcp Per Primary Gastroenterologist: Dr. Russella Dar       Reason for Consultation:   Anemia in setting of coffee-ground emesis         HPI:   [name] is a 62 y.o. male with a past medical history of CAD status post CABG and multiple PCI, chronic combined CHF, hyperlipidemia, hypertension, PVD, hepatitis C, bipolar disorder and gastritis with possible Barrett's and noncompliance who presented to the ER 03/18/18 with a history of nausea and vomiting.  We are consulted today by cardiology for anemia.    See cardiology's extensive note regarding cardiac history.  Last echo 03/2017 and LVEF of 35-40%.  Last cardiac cath 6-7 months ago with stent placement.  Compliant with aspirin and Plavix.  Diagnosed with a end STEMI with history of CAD status post CABG and multiple PCI, started on IV heparin 03/21/18.  New onset A. fib with rapid ventricular rate.  Acute on chronic combined CHF.  AKI with a creatinine 2.41 on admission, normalized after hydration.  His hemoglobin dropped to 8.7 from 10 and admission and GI eval is recommended prior to cardiac cath.    Today, explains that on 03/18/18 he awoke with nausea and vomiting of a "coffee ground black material". Describes vomiting uncontrollably for 20 min. Does admit to Advil use 4-6 tabs qd for the week before for a shoulder pain. Since admission only small amount of nausea and no further vomiting. Is on a BID PPI at home for history of reflux. Denies abdominal pain. No melena.   Briefly discusses h/o Barrett's esophagus, but no reported EGD since 2014 due to multiple stent placements and procedure being delayed due to recent addition of blood thinners.    Denies fever, chills, weight loss, anorexia, dysphagia, abdominal pain or symptoms that awaken him at night.   GI history: 10/02/17-office visit, Amy Esterwood: Seen in regards to surveillance endoscopy with history of  Barrett's esophagus, previous serial endoscopies and colonoscopies under [facility] with a history of colon polyps; that time procedures held off as patient had just underwent coronary stent placement 08/23/17 and we were requesting records from during [place] -colonoscopy May 2001 Dr. Loreta Ave showed left-sided diverticular disease, few right-sided diverticuli no polyps -upper endoscopy November 2001 per Dr. Loreta Ave small hiatal hernia and mild antral gastritis, EGD August 2005 Dr. Loreta Ave, grade 1 distal esophagitis, 3-4 cm hiatal hernia and antral erosions diffuse gastritis - Colonoscopy October 2005 Dr. Loreta Ave small internal hemorrhoids scattered pandiverticulosis no masses or polyps and large amount of residual stool in the colon.  Past Medical History:  Diagnosis Date  . Acute myocardial infarction of other inferior wall, initial episode of care   . Alcohol abuse   . Anxiety   . Arthritis   . Barrett's esophagus   . Bipolar 2 disorder (HCC)   . CAD (coronary artery disease)   . CHF (congestive heart failure) (HCC)   . Coronary artery disease   . Depression   . Diverticulosis   . GERD (gastroesophageal reflux disease)   . Gout   . Hyperlipemia   . Hypertension   . MI (myocardial infarction) (HCC)   . PAD (peripheral artery disease) (HCC)    Moderate LE atherosclerosis.  . Pneumonia   . S/P right coronary artery (RCA) stent placement 03/04/2012    Past Surgical History:  Procedure Laterality Date  . CHOLECYSTECTOMY    . CORONARY  ANGIOPLASTY    . CORONARY ARTERY BYPASS GRAFT    . CORONARY STENT PLACEMENT    . FEMORAL-POPLITEAL BYPASS GRAFT    . LEFT HEART CATHETERIZATION WITH CORONARY ANGIOGRAM N/A 03/04/2012   Procedure: LEFT HEART CATHETERIZATION WITH CORONARY ANGIOGRAM;  Surgeon: Runell Gess, MD;  Location: [facility] CATH LAB;  Service: Cardiovascular;  Laterality: N/A;  . LEFT HEART CATHETERIZATION WITH CORONARY ANGIOGRAM N/A 10/07/2013   Procedure: LEFT HEART CATHETERIZATION WITH CORONARY  ANGIOGRAM;  Surgeon: Corky Crafts, MD;  Location: [facility] CATH LAB;  Service: Cardiovascular;  Laterality: N/A;  . TOE SURGERY      Family History  Problem Relation Age of Onset  . Heart disease Mother   . Heart disease Father   . Colon cancer Paternal Grandfather     Social History   Tobacco Use  . Smoking status: Current Every Day Smoker    Packs/day: 0.50    Types: Cigarettes  . Smokeless tobacco: Never Used  . Tobacco comment: Uses Nicotene patch  Substance Use Topics  . Alcohol use: No    Comment: former drinker stopped 6 years ago   . Drug use: No    Comment: Former Marijuana user of 4 years    Prior to Admission medications   Medication Sig Start Date End Date Taking? Authorizing Provider  acetaminophen (TYLENOL) 500 MG tablet Take 500 mg by mouth every 6 (six) hours as needed.   Yes [provider]  albuterol (PROVENTIL HFA;VENTOLIN HFA) 108 (90 BASE) MCG/ACT inhaler Inhale 2 puffs into the lungs every 6 (six) hours as needed. WHEEZING   Yes [provider]  allopurinol (ZYLOPRIM) 100 MG tablet Take 450 mg by mouth daily.    Yes [provider]  aspirin EC 81 MG tablet Take 81 mg by mouth daily.   Yes [provider]  atorvastatin (LIPITOR) 80 MG tablet Take 80 mg by mouth at bedtime.    Yes [provider]  clopidogrel (PLAVIX) 75 MG tablet Take 75 mg by mouth daily.   Yes [provider]  divalproex (DEPAKOTE) 500 MG DR tablet Take 1,000 mg by mouth at bedtime.    Yes [provider]  ezetimibe (ZETIA) 10 MG tablet Take 10 mg by mouth daily.   Yes [provider]  fenofibrate (TRICOR) 145 MG tablet Take 145 mg by mouth daily.    Yes [provider]  isosorbide mononitrate (IMDUR) 30 MG 24 hr tablet Take 30 mg by mouth daily.   Yes [provider]  losartan (COZAAR) 50 MG tablet Take 50 mg by mouth daily.   Yes [provider]  metoprolol (LOPRESSOR) 25 MG tablet Take 1  tablet (25 mg total) by mouth 2 (two) times daily. Patient taking differently: Take 300 mg by mouth daily.  03/28/17  Yes Vassie Loll, MD  nitroGLYCERIN (NITROSTAT) 0.4 MG SL tablet Place 0.4 mg under the tongue every 5 (five) minutes as needed for chest pain. Repeat every 5 minutes up to 3 doses.  If no relief seek medical help (Do not take Cialis, Viagra or Levitra while you are on this medication)   Yes [provider]  Omega-3 Fatty Acids (FISH OIL) 1000 MG CAPS Take 2,000 mg by mouth 3 (three) times daily.    Yes [provider]  oxyCODONE (OXY IR/ROXICODONE) 5 MG immediate release tablet Take 5 mg by mouth every 6 (six) hours as needed for severe pain.   Yes [provider]  pantoprazole (PROTONIX) 40  MG tablet Take 40 mg by mouth 2 (two) times daily.   Yes [provider]    Current Facility-Administered Medications  Medication Dose Route Frequency Provider Last Rate Last Dose  . acetaminophen (TYLENOL) tablet 650 mg  650 mg Oral Q6H PRN Nyra Market, MD   650 mg at 03/19/18 4098   Or  . acetaminophen (TYLENOL) suppository 650 mg  650 mg Rectal Q6H PRN Nyra Market, MD      . allopurinol (ZYLOPRIM) tablet 450 mg  450 mg Oral Daily Nyra Market, MD   450 mg at 03/21/18 0953  . atorvastatin (LIPITOR) tablet 80 mg  80 mg Oral QHS Nyra Market, MD   80 mg at 03/21/18 2234  . diclofenac sodium (VOLTAREN) 1 % transdermal gel 2 g  2 g Topical QID PRN Angelita Ingles, MD   2 g at 03/22/18 0345  . divalproex (DEPAKOTE) DR tablet 1,000 mg  1,000 mg Oral QHS Nyra Market, MD   1,000 mg at 03/21/18 2234  . ezetimibe (ZETIA) tablet 10 mg  10 mg Oral Daily Nyra Market, MD   10 mg at 03/21/18 0954  . fenofibrate tablet 160 mg  160 mg Oral Daily Nyra Market, MD   160 mg at 03/21/18 0954  . furosemide (LASIX) tablet 40 mg  40 mg Oral Daily Nyra Market, MD   40 mg at 03/21/18 0953  . heparin ADULT infusion 100 units/mL (25000 units/213mL  sodium chloride 0.45%)  1,700 Units/hr Intravenous Continuous Stevphen Rochester, RPH 17 mL/hr at 03/22/18 0103 1,700 Units/hr at 03/22/18 0103  . ipratropium-albuterol (DUONEB) 0.5-2.5 (3) MG/3ML nebulizer solution 3 mL  3 mL Nebulization Q6H PRN Carlynn Purl C, DO      . MEDLINE mouth rinse  15 mL Mouth Rinse BID Carlynn Purl C, DO   15 mL at 03/21/18 2234  . metoprolol tartrate (LOPRESSOR) tablet 75 mg  75 mg Oral BID Bhagat, Bhavinkumar, PA   75 mg at 03/21/18 2233  . nitroGLYCERIN (NITROSTAT) SL tablet 0.4 mg  0.4 mg Sublingual Q5 min PRN Angelita Ingles, MD      . ondansetron [facility]) injection 4 mg  4 mg Intravenous Q8H PRN Nyra Market, MD      . oxyCODONE (Oxy IR/ROXICODONE) immediate release tablet 5 mg  5 mg Oral Q12H PRN Scherrie Gerlach, MD   5 mg at 03/22/18 0419  . pantoprazole (PROTONIX) EC tablet 40 mg  40 mg Oral BID Hammons, Kimberly B, RPH   40 mg at 03/21/18 2234    Allergies as of 03/18/2018 - Review Complete 03/18/2018  Allergen Reaction Noted  . Prednisone Other (See Comments) 12/20/2011  . Lisinopril Cough 03/26/2017     Review of Systems:    Constitutional: No weight loss, fever or chills Skin: No rash  Cardiovascular: + chest pressure  Respiratory: No SOB Gastrointestinal: See HPI and otherwise negative Genitourinary: No dysuria Neurological: No headache Musculoskeletal: No new muscle or joint pain Hematologic: No bruising Psychiatric: No history of depression or anxiety   Physical Exam:  Vital signs in last 24 hours: Temp:  [97.8 F (36.6 C)-99.4 F (37.4 C)] 98.2 F (36.8 C) (03/30 0601) Pulse Rate:  [57-101] 57 (03/30 0601) Resp:  [18-19] 18 (03/30 0601) BP: (104-121)/(67-94) 104/68 (03/30 0601) SpO2:  [97 %-98 %] 98 % (03/30 0601) Weight:  [164 lb 10.9 oz (74.7 kg)-167 lb (75.8 kg)] 167 lb (75.8 kg) (03/30 0601) Last BM Date: 03/21/18 General:   Pleasant Caucasian male appears  to be in NAD, Well developed, Well nourished, alert and  cooperative Head:  Normocephalic and atraumatic. Eyes:   PEERL, EOMI. No icterus. Conjunctiva pink. Ears:  Normal auditory acuity. Neck:  Supple +JVD Throat: Oral cavity and pharynx without inflammation, swelling or lesion.  Lungs: Respirations even and unlabored. Bibasilar rales Heart: Normal S1, S2. +systolic murmur, irregularly irregular rhythm, No peripheral edema, cyanosis or pallor.  Abdomen:  Soft, nondistended, nontender. No rebound or guarding. Normal bowel sounds. No appreciable masses or hepatomegaly. Rectal:  Not performed.  Msk:  Symmetrical without gross deformities. Peripheral pulses intact.  Extremities:  Without edema, no deformity or joint abnormality. Neurologic:  Alert and  oriented x4;  grossly normal neurologically.  Skin:   Dry and intact without significant lesions or rashes. Psychiatric: Demonstrates good judgement and reason without abnormal affect or behaviors.   LAB RESULTS: Recent Labs    03/20/18 0442 03/21/18 0753 03/21/18 2338  WBC 9.0 9.8 9.1  HGB 8.6* 8.7* 9.6*  HCT 26.4* 27.6* 29.5*  PLT 123* 148* 143*   BMET Recent Labs    03/20/18 0442 03/21/18 0216  NA 130* 126*  K 4.1 3.6  CL 100* 94*  CO2 21* 19*  GLUCOSE 102* 94  BUN 23* 19  CREATININE 0.99 0.96  CALCIUM 8.3* 8.8*     Impression / Plan:   Impression: 1.  A. fib: New paroxysmal A. fib, RVR, following with cardiology 2.  Anemia in the setting of coffee-ground emesis: Hemoglobin 10-->8.7 with history of coffee-ground emesis prior to admission, with NSAID use; Concern for peptic disease 3.  Aspiration pneumonia: Rales in his right lung base, thought aspiration pneumonia, from excessive nausea and vomiting 4.  Chronic combined systolic and diastolic CHF: Most recent echo with LVEF 35-40% April 2018 5.  Non-ST segment elevation myocardial infarction: Known CAD, stent placed 6 months ago in [place], on aspirin and Plavix, held for the past few days with coffee-ground emesis, patient  is requiring repeat heart cath  Plan: 1.  EGD today with Dr. Christella HartiganJacobs.  Did discuss risk, benefits, limitations alternatives and the patient agrees to proceed. 2.  Continue other supportive measures with monitoring of hemoglobin and transfusion as needed 3. Patient strict NPO until after time of procedure 4. Agree with BID PPI 5.  Please await further recommendations from Dr. Christella HartiganJacobs after time procedure.  Thank you for your kind consultation, we will continue to follow.  Violet BaldyJennifer Lynne Lemmon  03/22/2018, 10:14 AM Pager #: [phone]   ________________________________________________________________________  [facility] MD note:  I personally examined the patient, reviewed the data and agree with the assessment and plan described above.  EGD this afternoon for coffee ground hematemesis, upcoming cardiac cath.  He's on heparin, ASA and plavix and so I do not plan on biopsying any routine, non-nodular Barrett's appearing mucosa in this acute setting.  Given daily ASA use and recent overuse of NSAIDs (6 advil a day for 1-2 weeks) he may have gastritis, PUD.     Rob Buntinganiel Diyana Starrett, MD [facility] Pager [phone]

## 2018-03-22 NOTE — Progress Notes (Signed)
  Echocardiogram 2D Echocardiogram has been attempted 2x. Patient left for Endoscopy procedure at first attempt. Patient leaving for x-ray.   Dorena Dewiffany G Bambi Fehnel 03/22/2018, 3:46 PM

## 2018-03-22 NOTE — Progress Notes (Signed)
Pt's HR noted to be a. Fib rhythm ranging from the low 100's-low 130's.  On assessment, pt is resting comfortably and asymptomatic.  Paged IMTS provider Winfrey, who issued no new orders.  Will continue to monitor.

## 2018-03-22 NOTE — Progress Notes (Signed)
ANTICOAGULATION CONSULT NOTE -Follow up  Pharmacy Consult for heparin resume post EGD Indication: chest pain/ACS  Allergies  Allergen Reactions  . Prednisone Other (See Comments)    Causes steroid rage (takes regularly at home though)  . Lisinopril Cough    Patient Measurements: Height: 5' 9.5" (176.5 cm) Weight: 167 lb (75.8 kg) IBW/kg (Calculated) : 71.85 Heparin Dosing Weight: 75.8 kg  Vital Signs: Temp: 97.9 F (36.6 C) (03/30 1239) Temp Source: Oral (03/30 1239) BP: 111/86 (03/30 1405) Pulse Rate: 63 (03/30 1405)  Labs: Recent Labs    03/20/18 0442  03/21/18 0216  03/21/18 0753 03/21/18 1443 03/21/18 1613 03/21/18 2338 03/22/18 0806 03/22/18 1036  HGB 8.6*  --   --   --  8.7*  --   --  9.6*  --  9.2*  HCT 26.4*  --   --   --  27.6*  --   --  29.5*  --  28.2*  PLT 123*  --   --   --  148*  --   --  143*  --  167  HEPARINUNFRC  --   --   --    < > <0.10*  --  <0.10* <0.10* 0.16*  --   CREATININE 0.99  --  0.96  --   --   --   --   --   --  0.82  TROPONINI  --    < > 1.14*  --  0.90* 0.70*  --   --   --   --    < > = values in this interval not displayed.    Estimated Creatinine Clearance: 93.8 mL/min (by C-G formula based on SCr of 0.82 mg/dL).   Medical History: Past Medical History:  Diagnosis Date  . Acute myocardial infarction of other inferior wall, initial episode of care   . Alcohol abuse   . Anxiety   . Arthritis   . Barrett's esophagus   . Bipolar 2 disorder (HCC)   . CAD (coronary artery disease)   . CHF (congestive heart failure) (HCC)   . Coronary artery disease   . Depression   . Diverticulosis   . GERD (gastroesophageal reflux disease)   . Gout   . Hyperlipemia   . Hypertension   . MI (myocardial infarction) (HCC)   . PAD (peripheral artery disease) (HCC)    Moderate LE atherosclerosis.  . Pneumonia   . S/P right coronary artery (RCA) stent placement 03/04/2012    Medications:  Medications Prior to Admission  Medication Sig  Dispense Refill Last Dose  . acetaminophen (TYLENOL) 500 MG tablet Take 500 mg by mouth every 6 (six) hours as needed.   unknown at prn  . albuterol (PROVENTIL HFA;VENTOLIN HFA) 108 (90 BASE) MCG/ACT inhaler Inhale 2 puffs into the lungs every 6 (six) hours as needed. WHEEZING   03/15/2018 at prn  . allopurinol (ZYLOPRIM) 100 MG tablet Take 450 mg by mouth daily.    03/17/2018 at Unknown time  . aspirin EC 81 MG tablet Take 81 mg by mouth daily.   03/17/2018 at Unknown time  . atorvastatin (LIPITOR) 80 MG tablet Take 80 mg by mouth at bedtime.    03/17/2018 at Unknown time  . clopidogrel (PLAVIX) 75 MG tablet Take 75 mg by mouth daily.   03/17/2018 at Unknown time  . divalproex (DEPAKOTE) 500 MG DR tablet Take 1,000 mg by mouth at bedtime.    03/17/2018 at Unknown time  . ezetimibe (ZETIA) 10 MG  tablet Take 10 mg by mouth daily.   03/17/2018 at Unknown time  . fenofibrate (TRICOR) 145 MG tablet Take 145 mg by mouth daily.    03/17/2018 at Unknown time  . isosorbide mononitrate (IMDUR) 30 MG 24 hr tablet Take 30 mg by mouth daily.   03/17/2018 at Unknown time  . losartan (COZAAR) 50 MG tablet Take 50 mg by mouth daily.   03/17/2018 at Unknown time  . metoprolol (LOPRESSOR) 25 MG tablet Take 1 tablet (25 mg total) by mouth 2 (two) times daily. (Patient taking differently: Take 300 mg by mouth daily. ) 60 tablet 1 03/17/2018 at 2230  . nitroGLYCERIN (NITROSTAT) 0.4 MG SL tablet Place 0.4 mg under the tongue every 5 (five) minutes as needed for chest pain. Repeat every 5 minutes up to 3 doses.  If no relief seek medical help (Do not take Cialis, Viagra or Levitra while you are on this medication)   03/15/2018 at prn  . Omega-3 Fatty Acids (FISH OIL) 1000 MG CAPS Take 2,000 mg by mouth 3 (three) times daily.    03/17/2018 at Unknown time  . oxyCODONE (OXY IR/ROXICODONE) 5 MG immediate release tablet Take 5 mg by mouth every 6 (six) hours as needed for severe pain.   03/16/2018 at prn  . pantoprazole (PROTONIX) 40 MG  tablet Take 40 mg by mouth 2 (two) times daily.   03/17/2018 at Unknown time    Assessment: 62 yo man to start heparin for CP and elevated troponin.  He has a h/o GI bleed and Hg and PTLC are low.  He was admitted with coffee-ground emesis with recent PCI, CAD post CABG.    Heparin level 0.16 this AM after heparin rate increased to 1700 units/hr.  Heparin drip was stopped this AM by cardiology. Noting that cardiac catheterization, diagnostic during this hospitalization may be helpful.   EGD done today. No fresh or old blood in the UGI tract.  GI okay'd to restart heparin and to use whatever blood thinners or atniplatelet agents are needed to treat CAD.  Now s/p EGD pharmacy consulted to resume heparin drip, no bolus.     Goal of Therapy:  Heparin level 0.3-0.5 units/hr Monitor platelets by anticoagulation protocol: Yes   Plan:  Resume IV heparin to 1850 units/hr (continue to aim for low goal) Check heparin level in 6 hours after increase and daily while on heparin Monitor for bleeding complications   [name], RPh Clinical Pharmacist Pager: [phone] 03/22/2018 3:18 PM

## 2018-03-22 NOTE — Interval H&P Note (Signed)
History and Physical Interval Note:  03/22/2018 1:01 PM  Shelly FlattenRoger T Oberhaus  has presented today for surgery, with the diagnosis of Coffee-ground emesis, Anemia  The various methods of treatment have been discussed with the patient and family. After consideration of risks, benefits and other options for treatment, the patient has consented to  Procedure(s): ESOPHAGOGASTRODUODENOSCOPY (EGD) WITH PROPOFOL (N/A) as a surgical intervention .  The patient's history has been reviewed, patient examined, no change in status, stable for surgery.  I have reviewed the patient's chart and labs.  Questions were answered to the patient's satisfaction.     Rachael Feeaniel P Jacobs

## 2018-03-22 NOTE — Op Note (Addendum)
[facility] Patient Name: [name] Procedure Date : 03/22/2018 MRN: [id number] Attending MD: Rachael Fee , MD Date of Birth: Jul 31, 1956 CSN: [id number] Age: 62 Admit Type: Inpatient Procedure:                Upper GI endoscopy Indications:              Coffee-ground emesis Providers:                Rachael Fee, MD, Bonney Leitz, Kandice Robinsons, Technician Referring MD:              Medicines:                Fentanyl 75 micrograms IV, Midazolam 6 mg IV Complications:            No immediate complications. Estimated blood loss:                            None. Estimated Blood Loss:     Estimated blood loss: none. Procedure:                Pre-Anesthesia Assessment:                           - Prior to the procedure, a History and Physical                            was performed, and patient medications and                            allergies were reviewed. The patient's tolerance of                            previous anesthesia was also reviewed. The risks                            and benefits of the procedure and the sedation                            options and risks were discussed with the patient.                            All questions were answered, and informed consent                            was obtained. Prior Anticoagulants: The patient has                            taken Plavix (clopidogrel), last dose was 1 day                            prior to procedure. ASA Grade Assessment: IV - A  patient with severe systemic disease that is a                            constant threat to life. After reviewing the risks                            and benefits, the patient was deemed in                            satisfactory condition to undergo the procedure.                           After obtaining informed consent, the endoscope was                            passed under direct vision.  Throughout the                            procedure, the patient's blood pressure, pulse, and                            oxygen saturations were monitored continuously. The                            [id number] ([id number] scope was introduced through the                            mouth, and advanced to the second part of duodenum.                            The upper GI endoscopy was accomplished without                            difficulty. The patient tolerated the procedure                            well. Scope In: Scope Out: Findings:      Slightly irregular but non-nodular Z-line.      A small hiatal hernia was present.      The exam was otherwise without abnormality. There was no blood in the       UGI tract. Impression:               - Slightly irregular but non-nodular Z-line (does                            not qualify as Barrett's type mucosa under newest                            criteria).                           - Small hiatal hernia.                           -  Otherwise normal examination. No fresh or old                            blood in the UGI tract. Moderate Sedation:      Moderate (conscious) sedation was administered by the endoscopy nurse       and supervised by the endoscopist. The following parameters were       monitored: oxygen saturation, heart rate, blood pressure, and response       to care. Total physician intraservice time was 15 minutes. Recommendation:           - Return to hospital room for ongoing (cardiac)                            care.                           - OK to restart heparin and to use whatever blood                            thinners or antiplatelet agents are needed to                            appropriately treat his CAD.                           - Please call if any significant, overt GI bleeding. Procedure Code(s):        --- Professional ---                           [phone], Esophagogastroduodenoscopy, flexible,                             transoral; diagnostic, including collection of                            specimen(s) by brushing or washing, when performed                            (separate procedure) Diagnosis Code(s):        --- Professional ---                           K44.9, Diaphragmatic hernia without obstruction or                            gangrene                           K92.0, Hematemesis CPT copyright 2016 [employer]. All rights reserved. The codes documented in this report are preliminary and upon coder review may  be revised to meet current compliance requirements. Rachael Fee, MD 03/22/2018 1:34:25 PM This report has been signed electronically. Number of Addenda: 0

## 2018-03-22 NOTE — Plan of Care (Signed)

## 2018-03-22 NOTE — Progress Notes (Signed)
Subjective:  Patient endorses occasional palpitations associated with chest pain, but without dizziness. He endorses improvement in his breathing. He denies abd pain, n/v, hematochezia or melena; last BM this AM.  Objective:  Vital signs in last 24 hours: Vitals:   03/21/18 1318 03/21/18 2126 03/22/18 0348 03/22/18 0601  BP: 121/67 (!) 106/94 109/75 104/68  Pulse: (!) 59 (!) 101  (!) 57  Resp: 19 18  18   Temp: 97.8 F (36.6 C) 99.4 F (37.4 C)  98.2 F (36.8 C)  TempSrc: Oral Oral  Oral  SpO2: 98% 97%  98%  Weight: 164 lb 10.9 oz (74.7 kg)   167 lb (75.8 kg)  Height:       GEN: Lying in bed in NAD CV: irregularly irregular, tachycardic, no m/r/g RESP: R sided rales to mid lung, no wheezing. No increased work of breathing, on RA ABD: Soft, nontender, nondistended, +BS EXT: no LE edema PSYCH: Calm and pleasant  Assessment/Plan:  Principal Problem:   Pneumonia Active Problems:   CAD, CABG X 27 March 2008   AKI (acute kidney injury) (HCC)   Chronic systolic CHF (congestive heart failure) (HCC)   Nausea and vomiting  [name] is a 62 yo male with PMH of HFrEF (35-40%), G2DD, CAD (s/p CABG x5 in 2009, BMS SVG-RCA x2 in 2013 and 2014, and DES 2018) PAD s/p fem-pop bypass, bipolar disorder, and chronic shoulder pain who presents with ?hematemesis. No recurrence of hematemesis while here, however he is being treated for aspiration pneumonia given right-sided infiltrates on CXR and history of N/V and ingestion of unknown pain medication/sedation. Newly noted afib with RVR while inpatient - requiring extra doses of IV metoprolol on top of increased PO metoprolol. Also had an episode of chest pressure last night. Troponin elevated and EKG without acute ischemic changes, concerning for NSTEMI.  NSTEMI CAD s/p CABG and RCA BMS 2013, 2014 and DES 07/2017 PAD s/p fem-pop bypass Had an episode of chest pressure last night. Troponin elevated at 1.07 -> 1.14 -> 0.90 and EKG without acute  ischemic changes. Started on IV heparin drip overnight. NSTEMI could be 2/2 demand in the setting of afib with RVR and acute illness. He does have an extensive cardiac history and we have been holding ASA and Plavix while he has been here due to concern for GI bleed. He has not had recurrence of hematemesis while here, Hb stable, and had recent DES placed. Will await further Cardiology recs. - Cardiology consulted; appreciate their assistance - Continue IV heparin drip - Holding home ASA and Plavix, may need to restart one or both, will defer to Cards - Continue home atorvastatin 80mg  daily, ezetimibe 10mg  daily - cards needs GI clearance that if GI bleeding is present is controlled prior to cath.  Afib with RVR New diagnosis. HR in 130s-140s overnight, remaining in afib with rates in 110s with increased PO metoprolol and extra doses of IV metoprolol last night. and IV metoprolol. New onset afib possibly secondary to ongoing infection. Currently on IV heparin drip for NSTEMI. CHADS2-VASc score of at least 3 and with hx of GI bleed, patient may not be great candidate for long-term anticoagulation. Cards consulted for assistance and will need follow-up with PCP for further management. - Cardiology consulted; appreciate their recommendations - Continue telemetry - Continue PO metoprolol 50mg  BID - Currently on IV heparin drip  Pneumonia, presumed aspiration Patient with ingestion of unknown pain medicine, sedation on exam and questionable recall of events earlier in  the day with nausea, vomiting and CXR with right sided infiltrates consistent with pneumonia. Satting well on RA. Currently on augmentin. Afebrile. - Continue PO Augmentin 875-125mg  BID x7 days total (3/26 -> 4/2) - F/u BCx 3/26 -> NG x3d  N/V, resolved Hx of severe gastritis (EGD 2005), Barrett's esophagus, previous GI bleed Concerning for GI bleed with history of multiple GI issues and use of ASA and plavix for CAD and ibuprofen for  pain. Denies melena or hematochezia. No abdominal pain or recurrence of N/V.  - IV protonix 40mg  BID - IV Zofran PRN - Holding home ASA, plavix, and ibuprofen - f/u CBC this AM - f/u GI recs - EGD today  Dyspnea, improved after diuresis HFrEF (03/2017 TTE: EF 35-40%, G2DD) HTN Home regimen includes imdur 30mg  daily, losartan 50mg  daily, and metoprolol 25mg  BID. Volume status improved after diuresis; appears euvolemic today. Net 2L out. - Holding home imdur 30mg  daily, losartan 50mg  daily given normotension - Continue metoprolol 50mg  BID - Daily weights, Strict I/Os - PO lasix 40mg  daily  Bipolar disorder - Continue home divalproex 1000mg  QHS  Chronic Shoulder Pain [facility]. Gets extra narcotics off the street when he runs out of his prescription during the month. Will continue to emphasize importance of abstaining from extra narcotics due to multiple risks, including sedation and subsequent aspiration. - Tylenol PRN - Voltaren gel 2g QID - Oxy IR 5mg  q12h PRN  Code Status: DNR/DNI Dispo: Anticipated discharge in approximately 1-2 day(s).  [name], Stanley Jose, MD 03/22/2018, 10:45 AM Pager: Demetrius CharityP [phone]

## 2018-03-23 ENCOUNTER — Encounter (HOSPITAL_COMMUNITY): Payer: Self-pay | Admitting: Gastroenterology

## 2018-03-23 ENCOUNTER — Inpatient Hospital Stay (HOSPITAL_COMMUNITY): Payer: Non-veteran care

## 2018-03-23 DIAGNOSIS — N179 Acute kidney failure, unspecified: Secondary | ICD-10-CM

## 2018-03-23 DIAGNOSIS — Z7901 Long term (current) use of anticoagulants: Secondary | ICD-10-CM

## 2018-03-23 DIAGNOSIS — I5020 Unspecified systolic (congestive) heart failure: Secondary | ICD-10-CM

## 2018-03-23 DIAGNOSIS — I340 Nonrheumatic mitral (valve) insufficiency: Secondary | ICD-10-CM

## 2018-03-23 LAB — CULTURE, BLOOD (ROUTINE X 2)
CULTURE: NO GROWTH
Culture: NO GROWTH
SPECIAL REQUESTS: ADEQUATE
SPECIAL REQUESTS: ADEQUATE

## 2018-03-23 LAB — ECHOCARDIOGRAM COMPLETE
HEIGHTINCHES: 69.5 in
WEIGHTICAEL: 2694.9 [oz_av]

## 2018-03-23 LAB — BASIC METABOLIC PANEL
ANION GAP: 10 (ref 5–15)
BUN: 19 mg/dL (ref 6–20)
CALCIUM: 8.8 mg/dL — AB (ref 8.9–10.3)
CO2: 21 mmol/L — ABNORMAL LOW (ref 22–32)
Chloride: 102 mmol/L (ref 101–111)
Creatinine, Ser: 0.8 mg/dL (ref 0.61–1.24)
Glucose, Bld: 115 mg/dL — ABNORMAL HIGH (ref 65–99)
Potassium: 4 mmol/L (ref 3.5–5.1)
Sodium: 133 mmol/L — ABNORMAL LOW (ref 135–145)

## 2018-03-23 LAB — CBC
HCT: 29.7 % — ABNORMAL LOW (ref 39.0–52.0)
Hemoglobin: 9.4 g/dL — ABNORMAL LOW (ref 13.0–17.0)
MCH: 25.6 pg — ABNORMAL LOW (ref 26.0–34.0)
MCHC: 31.6 g/dL (ref 30.0–36.0)
MCV: 80.9 fL (ref 78.0–100.0)
PLATELETS: 164 10*3/uL (ref 150–400)
RBC: 3.67 MIL/uL — AB (ref 4.22–5.81)
RDW: 16.5 % — ABNORMAL HIGH (ref 11.5–15.5)
WBC: 6.9 10*3/uL (ref 4.0–10.5)

## 2018-03-23 LAB — MAGNESIUM: MAGNESIUM: 2.2 mg/dL (ref 1.7–2.4)

## 2018-03-23 LAB — HEPARIN LEVEL (UNFRACTIONATED)
HEPARIN UNFRACTIONATED: 0.34 [IU]/mL (ref 0.30–0.70)
HEPARIN UNFRACTIONATED: 0.31 [IU]/mL (ref 0.30–0.70)

## 2018-03-23 MED ORDER — AMOXICILLIN-POT CLAVULANATE 875-125 MG PO TABS
1.0000 | ORAL_TABLET | Freq: Two times a day (BID) | ORAL | Status: DC
  Administered 2018-03-23 – 2018-03-25 (×6): 1 via ORAL
  Filled 2018-03-23 (×6): qty 1

## 2018-03-23 MED ORDER — METOPROLOL TARTRATE 5 MG/5ML IV SOLN
5.0000 mg | Freq: Once | INTRAVENOUS | Status: AC | PRN
  Administered 2018-03-23: 5 mg via INTRAVENOUS
  Filled 2018-03-23: qty 5

## 2018-03-23 MED ORDER — CLOPIDOGREL BISULFATE 75 MG PO TABS
75.0000 mg | ORAL_TABLET | Freq: Every day | ORAL | Status: DC
  Administered 2018-03-23 – 2018-03-24 (×2): 75 mg via ORAL
  Filled 2018-03-23 (×2): qty 1

## 2018-03-23 NOTE — Progress Notes (Signed)
Notified by central tele at approximately 0105 that pt was in rapid a fib with HR up to 160's.  On assessment, pt denied chest pain or shortness of breath, and stated he had just gotten up to use the bathroom.  BP on assessment was 144/99, HR ranging between 130's-150's.  Paged IMTS provider Harbrecht, who ordered a one time dose of metoprolol 5mg  IV.  Will continue to monitor.

## 2018-03-23 NOTE — H&P (View-Only) (Signed)
Progress Note  Patient Name: Stanley FlattenRoger T Innis Date of Encounter: 03/23/2018  [facility]  Subjective   Overall fairly comfortable, no shortness of breath  Inpatient Medications    Scheduled Meds: . allopurinol  450 mg Oral Daily  . atorvastatin  80 mg Oral QHS  . divalproex  1,000 mg Oral QHS  . ezetimibe  10 mg Oral Daily  . fenofibrate  160 mg Oral Daily  . furosemide  40 mg Oral Daily  . mouth rinse  15 mL Mouth Rinse BID  . metoprolol tartrate  100 mg Oral BID  . pantoprazole  40 mg Oral BID   Continuous Infusions: . heparin 2,000 Units/hr (03/23/18 0721)   PRN Meds: acetaminophen **OR** acetaminophen, diclofenac sodium, ipratropium-albuterol, nitroGLYCERIN, ondansetron (ZOFRAN) IV, oxyCODONE   Vital Signs    Vitals:   03/23/18 0108 03/23/18 0456 03/23/18 0457 03/23/18 1126  BP: (!) 144/99 112/84  111/76  Pulse:  75  92  Resp:  20    Temp:  98.6 F (37 C)    TempSrc:      SpO2:  96%    Weight:   168 lb 6.9 oz (76.4 kg)   Height:        Intake/Output Summary (Last 24 hours) at 03/23/2018 1251 Last data filed at 03/23/2018 0457 Gross per 24 hour  Intake 248.56 ml  Output 620 ml  Net -371.44 ml   Filed Weights   03/22/18 0601 03/22/18 1239 03/23/18 0457  Weight: 167 lb (75.8 kg) 167 lb (75.8 kg) 168 lb 6.9 oz (76.4 kg)    Telemetry    AFIB 100-140 - Personally Reviewed  ECG    AFIB 102 - Personally Reviewed  Physical Exam   GEN: No acute distress.   Neck: No JVD Cardiac: irreg irreg, 1/6 S murmur, no rubs, or gallops.  Respiratory: Mild crackles right base. GI: Soft, nontender, non-distended  MS: No edema; No deformity. Neuro:  Nonfocal  Psych: Normal affect   Labs    Chemistry Recent Labs  Lab 03/18/18 1314  03/21/18 0216 03/22/18 1036 03/23/18 0915  NA 136   < > 126* 132* 133*  K 4.4   < > 3.6 3.2* 4.0  CL 104   < > 94* 98* 102  CO2 20*   < > 19* 22 21*  GLUCOSE 78   < > 94 87 115*  BUN 21*   < > 19 21* 19    CREATININE 2.41*   < > 0.96 0.82 0.80  CALCIUM 9.0   < > 8.8* 8.6* 8.8*  PROT 6.4*  --   --   --   --   ALBUMIN 4.1  --   --   --   --   AST 19  --   --   --   --   ALT 11*  --   --   --   --   ALKPHOS 48  --   --   --   --   BILITOT 0.9  --   --   --   --   GFRNONAA 27*   < > >60 >60 >60  GFRAA 31*   < > >60 >60 >60  ANIONGAP 12   < > 13 12 10    < > = values in this interval not displayed.     Hematology Recent Labs  Lab 03/21/18 2338 03/22/18 1036 03/23/18 0915  WBC 9.1 5.7 6.9  RBC 3.66* 3.56* 3.67*  HGB 9.6* 9.2* 9.4*  HCT 29.5* 28.2* 29.7*  MCV 80.6 79.2 80.9  MCH 26.2 25.8* 25.6*  MCHC 32.5 32.6 31.6  RDW 16.3* 16.4* 16.5*  PLT 143* 167 164    Cardiac Enzymes Recent Labs  Lab 03/20/18 2255 03/21/18 0216 03/21/18 0753 03/21/18 1443  TROPONINI 1.07* 1.14* 0.90* 0.70*   No results for input(s): TROPIPOC in the last 168 hours.   BNPNo results for input(s): BNP, PROBNP in the last 168 hours.   DDimer No results for input(s): DDIMER in the last 168 hours.   Radiology    Dg Chest 2 View  Result Date: 03/22/2018 CLINICAL DATA:  Pneumonia. EXAM: CHEST - 2 VIEW COMPARISON:  Radiographs of March 18, 2018. FINDINGS: Stable cardiomegaly. Status post coronary bypass graft. No pneumothorax or pleural effusion is noted. Left lung is clear. Right upper and lower lobe opacities are noted concerning for pneumonia. Bony thorax is unremarkable. IMPRESSION: Stable right upper and lower lobe opacities are noted concerning for pneumonia. Electronically Signed   By: Lupita Raider, M.D.   On: 03/22/2018 20:40    Cardiac Studies   03/27/17-EF 35-40%, decreased from prior study of 55%.  Aortic valve likely bicuspid with mild aortic stenosis.  Patient Profile     62 y.o. male admitted with anemia, coffee-ground emesis, recent PCI, CAD post CABG with paroxysmal atrial fibrillation  Assessment & Plan    Paroxysmal atrial fibrillation  -Still having some periods of rapid  ventricular response.  - Yesterday increased metoprolol to 100 BID. Let's see how he does today with that.   - With ECG clear, should be ok to add back NOAC post cath. Eliquis and Plavix combo would be reasonable.   Non-ST elevation myocardial infarction, CAD post CABG -Troponin was 1.1.  Trended downward.  A stent was placed approximately 6 months ago and both aspirin and Plavix have been held over the past several days because of his projected coffee-ground emesis.  Thankfully on EGD there was no signs of any active bleeding. -I think would be beneficial for him to undergo cardiac catheterization to evaluate his coronary anatomy, bypass anatomy especially as well as decreased ejection fraction. Will add him on for cath tomorrow. Orders written.   - I will add back Plavix 75mg  PO QD.   Chronic systolic heart failure -April 2018 EF now 35-40% reduced.  - consider Entresto tomorrow post cath. If yes to entresto, reduce Lasix dose from 40 to 20 QD.   Aspiration pneumonia -Rales at right base improved.  Acute kidney injury -Improved with resuscitation efforts  PVD post femoropopliteal bypass -Stable.  Bicuspid aortic valve -Mild aortic stenosis previously described on echocardiogram.   For questions or updates, please contact [facility] Please consult [email or web address] for contact info under Cardiology/STEMI.      Signed, Donato Schultz, MD  03/23/2018, 12:51 PM

## 2018-03-23 NOTE — Progress Notes (Signed)
 Progress Note  Patient Name: [name] Date of Encounter: 03/23/2018  Primary Cardiologist: [facility]  Subjective   Overall fairly comfortable, no shortness of breath  Inpatient Medications    Scheduled Meds: . allopurinol  450 mg Oral Daily  . atorvastatin  80 mg Oral QHS  . divalproex  1,000 mg Oral QHS  . ezetimibe  10 mg Oral Daily  . fenofibrate  160 mg Oral Daily  . furosemide  40 mg Oral Daily  . mouth rinse  15 mL Mouth Rinse BID  . metoprolol tartrate  100 mg Oral BID  . pantoprazole  40 mg Oral BID   Continuous Infusions: . heparin 2,000 Units/hr (03/23/18 0721)   PRN Meds: acetaminophen **OR** acetaminophen, diclofenac sodium, ipratropium-albuterol, nitroGLYCERIN, ondansetron (ZOFRAN) IV, oxyCODONE   Vital Signs    Vitals:   03/23/18 0108 03/23/18 0456 03/23/18 0457 03/23/18 1126  BP: (!) 144/99 112/84  111/76  Pulse:  75  92  Resp:  20    Temp:  98.6 F (37 C)    TempSrc:      SpO2:  96%    Weight:   168 lb 6.9 oz (76.4 kg)   Height:        Intake/Output Summary (Last 24 hours) at 03/23/2018 1251 Last data filed at 03/23/2018 0457 Gross per 24 hour  Intake 248.56 ml  Output 620 ml  Net -371.44 ml   Filed Weights   03/22/18 0601 03/22/18 1239 03/23/18 0457  Weight: 167 lb (75.8 kg) 167 lb (75.8 kg) 168 lb 6.9 oz (76.4 kg)    Telemetry    AFIB 100-140 - Personally Reviewed  ECG    AFIB 102 - Personally Reviewed  Physical Exam   GEN: No acute distress.   Neck: No JVD Cardiac: irreg irreg, 1/6 S murmur, no rubs, or gallops.  Respiratory: Mild crackles right base. GI: Soft, nontender, non-distended  MS: No edema; No deformity. Neuro:  Nonfocal  Psych: Normal affect   Labs    Chemistry Recent Labs  Lab 03/18/18 1314  03/21/18 0216 03/22/18 1036 03/23/18 0915  NA 136   < > 126* 132* 133*  K 4.4   < > 3.6 3.2* 4.0  CL 104   < > 94* 98* 102  CO2 20*   < > 19* 22 21*  GLUCOSE 78   < > 94 87 115*  BUN 21*   < > 19 21* 19    CREATININE 2.41*   < > 0.96 0.82 0.80  CALCIUM 9.0   < > 8.8* 8.6* 8.8*  PROT 6.4*  --   --   --   --   ALBUMIN 4.1  --   --   --   --   AST 19  --   --   --   --   ALT 11*  --   --   --   --   ALKPHOS 48  --   --   --   --   BILITOT 0.9  --   --   --   --   GFRNONAA 27*   < > >60 >60 >60  GFRAA 31*   < > >60 >60 >60  ANIONGAP 12   < > 13 12 10   < > = values in this interval not displayed.     Hematology Recent Labs  Lab 03/21/18 2338 03/22/18 1036 03/23/18 0915  WBC 9.1 5.7 6.9  RBC 3.66* 3.56* 3.67*    HGB 9.6* 9.2* 9.4*  HCT 29.5* 28.2* 29.7*  MCV 80.6 79.2 80.9  MCH 26.2 25.8* 25.6*  MCHC 32.5 32.6 31.6  RDW 16.3* 16.4* 16.5*  PLT 143* 167 164    Cardiac Enzymes Recent Labs  Lab 03/20/18 2255 03/21/18 0216 03/21/18 0753 03/21/18 1443  TROPONINI 1.07* 1.14* 0.90* 0.70*   No results for input(s): TROPIPOC in the last 168 hours.   BNPNo results for input(s): BNP, PROBNP in the last 168 hours.   DDimer No results for input(s): DDIMER in the last 168 hours.   Radiology    Dg Chest 2 View  Result Date: 03/22/2018 CLINICAL DATA:  Pneumonia. EXAM: CHEST - 2 VIEW COMPARISON:  Radiographs of March 18, 2018. FINDINGS: Stable cardiomegaly. Status post coronary bypass graft. No pneumothorax or pleural effusion is noted. Left lung is clear. Right upper and lower lobe opacities are noted concerning for pneumonia. Bony thorax is unremarkable. IMPRESSION: Stable right upper and lower lobe opacities are noted concerning for pneumonia. Electronically Signed   By: James  Green Jr, M.D.   On: 03/22/2018 20:40    Cardiac Studies   03/27/17-EF 35-40%, decreased from prior study of 55%.  Aortic valve likely bicuspid with mild aortic stenosis.  Patient Profile     62 y.o. male admitted with anemia, coffee-ground emesis, recent PCI, CAD post CABG with paroxysmal atrial fibrillation  Assessment & Plan    Paroxysmal atrial fibrillation  -Still having some periods of rapid  ventricular response.  - Yesterday increased metoprolol to 100 BID. Let's see how he does today with that.   - With ECG clear, should be ok to add back NOAC post cath. Eliquis and Plavix combo would be reasonable.   Non-ST elevation myocardial infarction, CAD post CABG -Troponin was 1.1.  Trended downward.  A stent was placed approximately 6 months ago and both aspirin and Plavix have been held over the past several days because of his projected coffee-ground emesis.  Thankfully on EGD there was no signs of any active bleeding. -I think would be beneficial for him to undergo cardiac catheterization to evaluate his coronary anatomy, bypass anatomy especially as well as decreased ejection fraction. Will add him on for cath tomorrow. Orders written.   - I will add back Plavix 75mg PO QD.   Chronic systolic heart failure -April 2018 EF now 35-40% reduced.  - consider Entresto tomorrow post cath. If yes to entresto, reduce Lasix dose from 40 to 20 QD.   Aspiration pneumonia -Rales at right base improved.  Acute kidney injury -Improved with resuscitation efforts  PVD post femoropopliteal bypass -Stable.  Bicuspid aortic valve -Mild aortic stenosis previously described on echocardiogram.   For questions or updates, please contact [facility] Please consult [email or web address] for contact info under Cardiology/STEMI.      Signed, Joanna Hall, MD  03/23/2018, 12:51 PM    

## 2018-03-23 NOTE — Progress Notes (Signed)
ANTICOAGULATION CONSULT NOTE -Follow up  Pharmacy Consult for Heparin  Indication: chest pain/ACS  Allergies  Allergen Reactions  . Prednisone Other (See Comments)    Causes steroid rage (takes regularly at home though)  . Lisinopril Cough    Patient Measurements: Height: 5' 9.5" (176.5 cm) Weight: 167 lb (75.8 kg) IBW/kg (Calculated) : 71.85 Heparin Dosing Weight: 75.8 kg  Vital Signs: Temp: 99.9 F (37.7 C) (03/30 2140) Temp Source: Oral (03/30 2140) BP: 144/99 (03/31 0108) Pulse Rate: 113 (03/30 2140)  Labs: Recent Labs    03/20/18 0442  03/21/18 0216 03/21/18 0753 03/21/18 1443  03/21/18 2338 03/22/18 0806 03/22/18 1036 03/22/18 2339  HGB 8.6*  --   --  8.7*  --   --  9.6*  --  9.2*  --   HCT 26.4*  --   --  27.6*  --   --  29.5*  --  28.2*  --   PLT 123*  --   --  148*  --   --  143*  --  167  --   HEPARINUNFRC  --   --   --  <0.10*  --    < > <0.10* 0.16*  --  <0.10*  CREATININE 0.99  --  0.96  --   --   --   --   --  0.82  --   TROPONINI  --    < > 1.14* 0.90* 0.70*  --   --   --   --   --    < > = values in this interval not displayed.    Estimated Creatinine Clearance: 93.8 mL/min (by C-G formula based on SCr of 0.82 mg/dL).   Medical History: Past Medical History:  Diagnosis Date  . Acute myocardial infarction of other inferior wall, initial episode of care   . Alcohol abuse   . Anxiety   . Arthritis   . Barrett's esophagus   . Bipolar 2 disorder (HCC)   . CAD (coronary artery disease)   . CHF (congestive heart failure) (HCC)   . Coronary artery disease   . Depression   . Diverticulosis   . GERD (gastroesophageal reflux disease)   . Gout   . Hyperlipemia   . Hypertension   . MI (myocardial infarction) (HCC)   . PAD (peripheral artery disease) (HCC)    Moderate LE atherosclerosis.  . Pneumonia   . S/P right coronary artery (RCA) stent placement 03/04/2012    Medications:  Medications Prior to Admission  Medication Sig Dispense Refill  Last Dose  . acetaminophen (TYLENOL) 500 MG tablet Take 500 mg by mouth every 6 (six) hours as needed.   unknown at prn  . albuterol (PROVENTIL HFA;VENTOLIN HFA) 108 (90 BASE) MCG/ACT inhaler Inhale 2 puffs into the lungs every 6 (six) hours as needed. WHEEZING   03/15/2018 at prn  . allopurinol (ZYLOPRIM) 100 MG tablet Take 450 mg by mouth daily.    03/17/2018 at Unknown time  . aspirin EC 81 MG tablet Take 81 mg by mouth daily.   03/17/2018 at Unknown time  . atorvastatin (LIPITOR) 80 MG tablet Take 80 mg by mouth at bedtime.    03/17/2018 at Unknown time  . clopidogrel (PLAVIX) 75 MG tablet Take 75 mg by mouth daily.   03/17/2018 at Unknown time  . divalproex (DEPAKOTE) 500 MG DR tablet Take 1,000 mg by mouth at bedtime.    03/17/2018 at Unknown time  . ezetimibe (ZETIA) 10 MG tablet Take  10 mg by mouth daily.   03/17/2018 at Unknown time  . fenofibrate (TRICOR) 145 MG tablet Take 145 mg by mouth daily.    03/17/2018 at Unknown time  . isosorbide mononitrate (IMDUR) 30 MG 24 hr tablet Take 30 mg by mouth daily.   03/17/2018 at Unknown time  . losartan (COZAAR) 50 MG tablet Take 50 mg by mouth daily.   03/17/2018 at Unknown time  . metoprolol (LOPRESSOR) 25 MG tablet Take 1 tablet (25 mg total) by mouth 2 (two) times daily. (Patient taking differently: Take 300 mg by mouth daily. ) 60 tablet 1 03/17/2018 at 2230  . nitroGLYCERIN (NITROSTAT) 0.4 MG SL tablet Place 0.4 mg under the tongue every 5 (five) minutes as needed for chest pain. Repeat every 5 minutes up to 3 doses.  If no relief seek medical help (Do not take Cialis, Viagra or Levitra while you are on this medication)   03/15/2018 at prn  . Omega-3 Fatty Acids (FISH OIL) 1000 MG CAPS Take 2,000 mg by mouth 3 (three) times daily.    03/17/2018 at Unknown time  . oxyCODONE (OXY IR/ROXICODONE) 5 MG immediate release tablet Take 5 mg by mouth every 6 (six) hours as needed for severe pain.   03/16/2018 at prn  . pantoprazole (PROTONIX) 40 MG tablet Take 40 mg  by mouth 2 (two) times daily.   03/17/2018 at Unknown time    Assessment: 62 yo man to start heparin for CP and elevated troponin.  He has a h/o GI bleed and Hg and PTLC are low. Admitted with coffee-ground emesis with recent PCI, CAD post CABG.    Heparin level 0.16 this AM after heparin rate increased to 1700 units/hr.  Heparin drip was stopped this AM by cardiology. Noting that cardiac catheterization, diagnostic during this hospitalization may be helpful.   EGD done today. No fresh or old blood in the UGI tract.  GI okay'd to restart heparin and to use whatever blood thinners or atniplatelet agents are needed to treat CAD.  Now s/p EGD pharmacy consulted to resume heparin drip, no bolus.    3/31 AM: heparin level is undetectable after re-start s/p EGD   Goal of Therapy:  Heparin level 0.3-0.5 units/hr Monitor platelets by anticoagulation protocol: Yes   Plan:  -Inc heparin to 2000 units/hr -0930 HL  Abran Duke, PharmD, BCPS Clinical Pharmacist Phone: [phone]

## 2018-03-23 NOTE — Progress Notes (Signed)
ANTICOAGULATION CONSULT NOTE -Follow up  Pharmacy Consult for Heparin  Indication: chest pain/ACS  Allergies  Allergen Reactions  . Prednisone Other (See Comments)    Causes steroid rage (takes regularly at home though)  . Lisinopril Cough    Patient Measurements: Height: 5' 9.5" (176.5 cm) Weight: 168 lb 6.9 oz (76.4 kg) IBW/kg (Calculated) : 71.85 Heparin Dosing Weight: 75.8 kg  Vital Signs: Temp: 98.2 F (36.8 C) (03/31 1414) Temp Source: Oral (03/31 1414) BP: 131/79 (03/31 1414) Pulse Rate: 70 (03/31 1414)  Labs: Recent Labs    03/21/18 0216  03/21/18 0753 03/21/18 1443  03/21/18 2338  03/22/18 1036 03/22/18 2339 03/23/18 0915 03/23/18 1456  HGB  --    < > 8.7*  --   --  9.6*  --  9.2*  --  9.4*  --   HCT  --    < > 27.6*  --   --  29.5*  --  28.2*  --  29.7*  --   PLT  --    < > 148*  --   --  143*  --  167  --  164  --   HEPARINUNFRC  --   --  <0.10*  --    < > <0.10*   < >  --  <0.10* 0.34 0.31  CREATININE 0.96  --   --   --   --   --   --  0.82  --  0.80  --   TROPONINI 1.14*  --  0.90* 0.70*  --   --   --   --   --   --   --    < > = values in this interval not displayed.    Estimated Creatinine Clearance: 96.1 mL/min (by C-G formula based on SCr of 0.8 mg/dL).   Medical History: Past Medical History:  Diagnosis Date  . Acute myocardial infarction of other inferior wall, initial episode of care   . Alcohol abuse   . Anxiety   . Arthritis   . Barrett's esophagus   . Bipolar 2 disorder (HCC)   . CAD (coronary artery disease)   . CHF (congestive heart failure) (HCC)   . Coronary artery disease   . Depression   . Diverticulosis   . GERD (gastroesophageal reflux disease)   . Gout   . Hyperlipemia   . Hypertension   . MI (myocardial infarction) (HCC)   . PAD (peripheral artery disease) (HCC)    Moderate LE atherosclerosis.  . Pneumonia   . S/P right coronary artery (RCA) stent placement 03/04/2012    Medications:  Medications Prior to  Admission  Medication Sig Dispense Refill Last Dose  . acetaminophen (TYLENOL) 500 MG tablet Take 500 mg by mouth every 6 (six) hours as needed.   unknown at prn  . albuterol (PROVENTIL HFA;VENTOLIN HFA) 108 (90 BASE) MCG/ACT inhaler Inhale 2 puffs into the lungs every 6 (six) hours as needed. WHEEZING   03/15/2018 at prn  . allopurinol (ZYLOPRIM) 100 MG tablet Take 450 mg by mouth daily.    03/17/2018 at Unknown time  . aspirin EC 81 MG tablet Take 81 mg by mouth daily.   03/17/2018 at Unknown time  . atorvastatin (LIPITOR) 80 MG tablet Take 80 mg by mouth at bedtime.    03/17/2018 at Unknown time  . clopidogrel (PLAVIX) 75 MG tablet Take 75 mg by mouth daily.   03/17/2018 at Unknown time  . divalproex (DEPAKOTE) 500 MG DR tablet  Take 1,000 mg by mouth at bedtime.    03/17/2018 at Unknown time  . ezetimibe (ZETIA) 10 MG tablet Take 10 mg by mouth daily.   03/17/2018 at Unknown time  . fenofibrate (TRICOR) 145 MG tablet Take 145 mg by mouth daily.    03/17/2018 at Unknown time  . isosorbide mononitrate (IMDUR) 30 MG 24 hr tablet Take 30 mg by mouth daily.   03/17/2018 at Unknown time  . losartan (COZAAR) 50 MG tablet Take 50 mg by mouth daily.   03/17/2018 at Unknown time  . metoprolol (LOPRESSOR) 25 MG tablet Take 1 tablet (25 mg total) by mouth 2 (two) times daily. (Patient taking differently: Take 300 mg by mouth daily. ) 60 tablet 1 03/17/2018 at 2230  . nitroGLYCERIN (NITROSTAT) 0.4 MG SL tablet Place 0.4 mg under the tongue every 5 (five) minutes as needed for chest pain. Repeat every 5 minutes up to 3 doses.  If no relief seek medical help (Do not take Cialis, Viagra or Levitra while you are on this medication)   03/15/2018 at prn  . Omega-3 Fatty Acids (FISH OIL) 1000 MG CAPS Take 2,000 mg by mouth 3 (three) times daily.    03/17/2018 at Unknown time  . oxyCODONE (OXY IR/ROXICODONE) 5 MG immediate release tablet Take 5 mg by mouth every 6 (six) hours as needed for severe pain.   03/16/2018 at prn  .  pantoprazole (PROTONIX) 40 MG tablet Take 40 mg by mouth 2 (two) times daily.   03/17/2018 at Unknown time    Assessment: 62 yo man continuing on heparin for CP and elevated troponin.  He has a h/o GI bleed and Hg and PTLC are low. Admitted with coffee-ground emesis with recent PCI, CAD post CABG.     Cardiology consulted, note possible need for cardiac cath, S/p EGD 3/30=> No fresh or old blood in the UGI tract.  GI okay'd to restart heparin on 3/30 and noted okay to use whatever blood thinners or antiplatelet agents needed to treat CAD. Heparin level now therapeutic x 2 for lower goal. CBC stable. No bleed documented.  Goal of Therapy:  Heparin level 0.3-0.5 units/hr Monitor platelets by anticoagulation protocol: Yes   Plan:  Continue IV heparin at 2000 units/hr Monitor daily heparin level and CBC, s/sx bleeding Cath planned for 4/1  Babs Bertin, PharmD, BCPS Clinical Pharmacist 03/23/2018 3:39 PM

## 2018-03-23 NOTE — Progress Notes (Signed)
ANTICOAGULATION CONSULT NOTE -Follow up  Pharmacy Consult for Heparin  Indication: chest pain/ACS  Allergies  Allergen Reactions  . Prednisone Other (See Comments)    Causes steroid rage (takes regularly at home though)  . Lisinopril Cough    Patient Measurements: Height: 5' 9.5" (176.5 cm) Weight: 168 lb 6.9 oz (76.4 kg) IBW/kg (Calculated) : 71.85 Heparin Dosing Weight: 75.8 kg  Vital Signs: Temp: 98.6 F (37 C) (03/31 0456) BP: 112/84 (03/31 0456) Pulse Rate: 75 (03/31 0456)  Labs: Recent Labs    03/21/18 0216  03/21/18 0753 03/21/18 1443  03/21/18 2338 03/22/18 0806 03/22/18 1036 03/22/18 2339 03/23/18 0915  HGB  --    < > 8.7*  --   --  9.6*  --  9.2*  --  9.4*  HCT  --    < > 27.6*  --   --  29.5*  --  28.2*  --  29.7*  PLT  --    < > 148*  --   --  143*  --  167  --  164  HEPARINUNFRC  --   --  <0.10*  --    < > <0.10* 0.16*  --  <0.10* 0.34  CREATININE 0.96  --   --   --   --   --   --  0.82  --  0.80  TROPONINI 1.14*  --  0.90* 0.70*  --   --   --   --   --   --    < > = values in this interval not displayed.    Estimated Creatinine Clearance: 96.1 mL/min (by C-G formula based on SCr of 0.8 mg/dL).   Medical History: Past Medical History:  Diagnosis Date  . Acute myocardial infarction of other inferior wall, initial episode of care   . Alcohol abuse   . Anxiety   . Arthritis   . Barrett's esophagus   . Bipolar 2 disorder (HCC)   . CAD (coronary artery disease)   . CHF (congestive heart failure) (HCC)   . Coronary artery disease   . Depression   . Diverticulosis   . GERD (gastroesophageal reflux disease)   . Gout   . Hyperlipemia   . Hypertension   . MI (myocardial infarction) (HCC)   . PAD (peripheral artery disease) (HCC)    Moderate LE atherosclerosis.  . Pneumonia   . S/P right coronary artery (RCA) stent placement 03/04/2012    Medications:  Medications Prior to Admission  Medication Sig Dispense Refill Last Dose  . acetaminophen  (TYLENOL) 500 MG tablet Take 500 mg by mouth every 6 (six) hours as needed.   unknown at prn  . albuterol (PROVENTIL HFA;VENTOLIN HFA) 108 (90 BASE) MCG/ACT inhaler Inhale 2 puffs into the lungs every 6 (six) hours as needed. WHEEZING   03/15/2018 at prn  . allopurinol (ZYLOPRIM) 100 MG tablet Take 450 mg by mouth daily.    03/17/2018 at Unknown time  . aspirin EC 81 MG tablet Take 81 mg by mouth daily.   03/17/2018 at Unknown time  . atorvastatin (LIPITOR) 80 MG tablet Take 80 mg by mouth at bedtime.    03/17/2018 at Unknown time  . clopidogrel (PLAVIX) 75 MG tablet Take 75 mg by mouth daily.   03/17/2018 at Unknown time  . divalproex (DEPAKOTE) 500 MG DR tablet Take 1,000 mg by mouth at bedtime.    03/17/2018 at Unknown time  . ezetimibe (ZETIA) 10 MG tablet Take 10 mg by  mouth daily.   03/17/2018 at Unknown time  . fenofibrate (TRICOR) 145 MG tablet Take 145 mg by mouth daily.    03/17/2018 at Unknown time  . isosorbide mononitrate (IMDUR) 30 MG 24 hr tablet Take 30 mg by mouth daily.   03/17/2018 at Unknown time  . losartan (COZAAR) 50 MG tablet Take 50 mg by mouth daily.   03/17/2018 at Unknown time  . metoprolol (LOPRESSOR) 25 MG tablet Take 1 tablet (25 mg total) by mouth 2 (two) times daily. (Patient taking differently: Take 300 mg by mouth daily. ) 60 tablet 1 03/17/2018 at 2230  . nitroGLYCERIN (NITROSTAT) 0.4 MG SL tablet Place 0.4 mg under the tongue every 5 (five) minutes as needed for chest pain. Repeat every 5 minutes up to 3 doses.  If no relief seek medical help (Do not take Cialis, Viagra or Levitra while you are on this medication)   03/15/2018 at prn  . Omega-3 Fatty Acids (FISH OIL) 1000 MG CAPS Take 2,000 mg by mouth 3 (three) times daily.    03/17/2018 at Unknown time  . oxyCODONE (OXY IR/ROXICODONE) 5 MG immediate release tablet Take 5 mg by mouth every 6 (six) hours as needed for severe pain.   03/16/2018 at prn  . pantoprazole (PROTONIX) 40 MG tablet Take 40 mg by mouth 2 (two) times  daily.   03/17/2018 at Unknown time    Assessment: 62 yo man to start heparin for CP and elevated troponin.  He has a h/o GI bleed and Hg and PTLC are low. Admitted with coffee-ground emesis with recent PCI, CAD post CABG.     Cardiology consulted, note possible need for cardiac cath, diagnostic during this hospitalization may be helpful.  S/p EGD yesterday 3/30=> No fresh or old blood in the UGI tract.  GI okay'd to restart heparin on 3/30 and noted okay to use whatever blood thinners or antiplatelet agents needed to treat CAD.    3/30 23:49PM: heparin level was undetectable after re-start s/p EGD, heparin increased to 2000 units/hr. 3/31 8 hour heparin level 0.34 therapeutic after rate increased to 2000 units/hr   Goal of Therapy:  Heparin level 0.3-0.5 units/hr Monitor platelets by anticoagulation protocol: Yes   Plan:  Continue IV heparin to 2000 units/hr 6 hour heparin level to confirm remains therapeutic Daily heparin level and CBC  Noah Delaine, RPh Clinical Pharmacist Pager: [phone] Main Pharmacy: [phone] 03/23/2018 11:06 AM

## 2018-03-23 NOTE — Progress Notes (Signed)
Subjective:  States he feels fine today.  Patient denies chest pain, shortness of breath or nausea/vomiting.    Objective:  Vital signs in last 24 hours: Vitals:   03/23/18 0108 03/23/18 0456 03/23/18 0457 03/23/18 1126  BP: (!) 144/99 112/84  111/76  Pulse:  75  92  Resp:  20    Temp:  98.6 F (37 C)    TempSrc:      SpO2:  96%    Weight:   168 lb 6.9 oz (76.4 kg)   Height:       GEN: Lying in bed in NAD CV: irregularly irregular, tachycardic, no m/r/g RESP: R sided rales to mid lung, no wheezing. No increased work of breathing, on RA ABD: Soft, nontender, nondistended, +BS EXT: no LE edema PSYCH: Calm and pleasant  Assessment/Plan:  Principal Problem:   Community acquired pneumonia Active Problems:   Hiatal hernia   CAD, CABG X 27 March 2008   AKI (acute kidney injury) (HCC)   Chronic systolic CHF (congestive heart failure) (HCC)   Nausea and vomiting   Coffee ground emesis  Mr. Wilkerson is a 62 yo male with PMH of HFrEF (35-40%), G2DD, CAD (s/p CABG x5 in 2009, BMS SVG-RCA x2 in 2013 and 2014, and DES 2018) PAD s/p fem-pop bypass, bipolar disorder, and chronic shoulder pain who presents with ?hematemesis. No recurrence of hematemesis while here, however he is being treated for aspiration pneumonia given right-sided infiltrates on CXR and history of N/V and ingestion of unknown pain medication/sedation. Newly noted afib with RVR while inpatient - requiring extra doses of IV metoprolol on top of increased PO metoprolol. Also had an episode of chest pressure. Troponin elevated and EKG without acute ischemic changes, concerning for NSTEMI.  NSTEMI CAD s/p CABG and RCA BMS 2013, 2014 and DES 07/2017 PAD s/p fem-pop bypass Had an episode of chest pressure during admission. Troponin elevated at 1.07 -> 1.14 -> 0.90 and EKG without acute ischemic changes. Started on IV heparin drip. NSTEMI could be 2/2 demand in the setting of afib with RVR and acute illness. He does have an  extensive cardiac history and we have been holding ASA and Plavix while he has been here due to concern for GI bleed. He has not had recurrence of hematemesis while here, Hb stable, and had recent DES placed. Will await further Cardiology recs. - Cardiology consulted; appreciate their assistance - Continue IV heparin drip - Holding home ASA and Plavix, may need to restart one or both, will defer to Cards - Continue home atorvastatin 80mg  daily, ezetimibe 10mg  daily - plan is for heart cath per cards  Afib with RVR New diagnosis. HR in 160s overnight, requiring an extra doses of IV metoprolol last night. Currently on IV heparin drip for NSTEMI. CHADS2-VASc score of at least 3 and with hx of GI bleed, patient may not be great candidate for long-term anticoagulation. Cards consulted for assistance. - Cardiology consulted; appreciate their recommendations - Continue telemetry - Increased PO metoprolol to 100mg  BID - Currently on IV heparin drip  Pneumonia, presumed aspiration Patient with ingestion of unknown pain medicine.  CXR with right sided infiltrates consistent with pneumonia. Satting well on RA. Currently on augmentin. Afebrile. PO Augmentin 875-125mg  BID x 7 days total (end date of 4/3)   - PO augmentin 875-125mg  daily (end date 4/3) - F/u BCx 3/26 -> NG x5d  N/V, resolved Hx of severe gastritis (EGD 2005), Barrett's esophagus, previous GI bleed Concerning for GI  bleed on admission, with history of multiple GI issues and use of ASA and plavix for CAD and ibuprofen for pain. Denies melena or hematochezia. No abdominal pain or recurrence of N/V. EGD without signs of bleeding on 3/30. Hgb stable - protonix 40mg  BID - IV Zofran PRN - Holding home ASA, plavix, and ibuprofen - CBC  Dyspnea, improved after diuresis HFrEF (03/2017 TTE: EF 35-40%, G2DD) HTN Home regimen includes imdur 30mg  daily, losartan 50mg  daily, and metoprolol 25mg  BID. Net -1.5L since admission - Holding home imdur  30mg  daily, losartan 50mg  daily given normotension - Continue metoprolol 100mg  BID - Daily weights, Strict I/Os - PO lasix 40mg  daily  Bipolar disorder - Continue home divalproex 1000mg  QHS  Chronic Shoulder Pain On [facility]. - Tylenol PRN - Voltaren gel 2g QID - Oxy IR 5mg  q12h PRN  Code Status: DNR/DNI Dispo: Anticipated discharge pending clinical improvement.  Geralyn CorwinHoffman, Aubrianne Molyneux [place], DO 03/23/2018, 1:15 PM Pager: Demetrius CharityP [phone]

## 2018-03-23 NOTE — Progress Notes (Signed)
  Echocardiogram 2D Echocardiogram has been performed.  [name], [name] F 03/23/2018, 11:15 AM

## 2018-03-24 ENCOUNTER — Encounter (HOSPITAL_COMMUNITY)
Admission: EM | Disposition: A | Discharge status: Home / Self Care | Payer: Self-pay | Source: Home / Self Care | Attending: Internal Medicine

## 2018-03-24 DIAGNOSIS — R112 Nausea with vomiting, unspecified: Secondary | ICD-10-CM

## 2018-03-24 DIAGNOSIS — I214 Non-ST elevation (NSTEMI) myocardial infarction: Secondary | ICD-10-CM

## 2018-03-24 DIAGNOSIS — I4891 Unspecified atrial fibrillation: Secondary | ICD-10-CM

## 2018-03-24 HISTORY — PX: LEFT HEART CATH AND CORONARY ANGIOGRAPHY: CATH118249

## 2018-03-24 LAB — BASIC METABOLIC PANEL
ANION GAP: 9 (ref 5–15)
BUN: 19 mg/dL (ref 6–20)
CALCIUM: 8.6 mg/dL — AB (ref 8.9–10.3)
CO2: 23 mmol/L (ref 22–32)
Chloride: 102 mmol/L (ref 101–111)
Creatinine, Ser: 0.81 mg/dL (ref 0.61–1.24)
Glucose, Bld: 105 mg/dL — ABNORMAL HIGH (ref 65–99)
POTASSIUM: 3.7 mmol/L (ref 3.5–5.1)
SODIUM: 134 mmol/L — AB (ref 135–145)

## 2018-03-24 LAB — CBC
HCT: 29.5 % — ABNORMAL LOW (ref 39.0–52.0)
HEMOGLOBIN: 9.3 g/dL — AB (ref 13.0–17.0)
MCH: 25.6 pg — ABNORMAL LOW (ref 26.0–34.0)
MCHC: 31.5 g/dL (ref 30.0–36.0)
MCV: 81.3 fL (ref 78.0–100.0)
PLATELETS: 162 10*3/uL (ref 150–400)
RBC: 3.63 MIL/uL — AB (ref 4.22–5.81)
RDW: 16.5 % — ABNORMAL HIGH (ref 11.5–15.5)
WBC: 8.7 10*3/uL (ref 4.0–10.5)

## 2018-03-24 LAB — PROTIME-INR
INR: 1.23
PROTHROMBIN TIME: 15.4 s — AB (ref 11.4–15.2)

## 2018-03-24 LAB — SURGICAL PCR SCREEN
MRSA, PCR: NEGATIVE
STAPHYLOCOCCUS AUREUS: POSITIVE — AB

## 2018-03-24 LAB — HEPARIN LEVEL (UNFRACTIONATED): HEPARIN UNFRACTIONATED: 0.44 [IU]/mL (ref 0.30–0.70)

## 2018-03-24 LAB — POCT ACTIVATED CLOTTING TIME: ACTIVATED CLOTTING TIME: 153 s

## 2018-03-24 LAB — MAGNESIUM: MAGNESIUM: 1.8 mg/dL (ref 1.7–2.4)

## 2018-03-24 SURGERY — LEFT HEART CATH AND CORONARY ANGIOGRAPHY
Anesthesia: LOCAL

## 2018-03-24 MED ORDER — CHLORHEXIDINE GLUCONATE CLOTH 2 % EX PADS
6.0000 | MEDICATED_PAD | Freq: Every day | CUTANEOUS | Status: DC
  Administered 2018-03-24: 6 via TOPICAL

## 2018-03-24 MED ORDER — SODIUM CHLORIDE 0.9% FLUSH
3.0000 mL | Freq: Two times a day (BID) | INTRAVENOUS | Status: DC
  Administered 2018-03-24 – 2018-03-25 (×2): 3 mL via INTRAVENOUS

## 2018-03-24 MED ORDER — ASPIRIN 81 MG PO CHEW
81.0000 mg | CHEWABLE_TABLET | ORAL | Status: AC
  Administered 2018-03-24: 81 mg via ORAL
  Filled 2018-03-24: qty 1

## 2018-03-24 MED ORDER — SODIUM CHLORIDE 0.9 % IV SOLN: 250.0000 mL | INTRAVENOUS | Status: DC | PRN

## 2018-03-24 MED ORDER — SODIUM CHLORIDE 0.9 % IV SOLN: INTRAVENOUS | Status: AC

## 2018-03-24 MED ORDER — MORPHINE SULFATE (PF) 2 MG/ML IV SOLN: 2.0000 mg | INTRAVENOUS | Status: DC | PRN

## 2018-03-24 MED ORDER — MIDAZOLAM HCL 2 MG/2ML IJ SOLN
INTRAMUSCULAR | Status: DC | PRN
  Administered 2018-03-24: 1 mg via INTRAVENOUS

## 2018-03-24 MED ORDER — CLOPIDOGREL BISULFATE 75 MG PO TABS
75.0000 mg | ORAL_TABLET | Freq: Every day | ORAL | Status: DC
  Administered 2018-03-25: 75 mg via ORAL
  Filled 2018-03-24: qty 1

## 2018-03-24 MED ORDER — FENTANYL CITRATE (PF) 100 MCG/2ML IJ SOLN
INTRAMUSCULAR | Status: DC | PRN
  Administered 2018-03-24: 25 ug via INTRAVENOUS

## 2018-03-24 MED ORDER — MUPIROCIN 2 % EX OINT
1.0000 "application " | TOPICAL_OINTMENT | Freq: Two times a day (BID) | CUTANEOUS | Status: DC
  Administered 2018-03-24 – 2018-03-25 (×3): 1 via NASAL
  Filled 2018-03-24 (×3): qty 22

## 2018-03-24 MED ORDER — ASPIRIN 81 MG PO CHEW
81.0000 mg | CHEWABLE_TABLET | Freq: Every day | ORAL | Status: DC
  Administered 2018-03-25: 81 mg via ORAL
  Filled 2018-03-24: qty 1

## 2018-03-24 MED ORDER — LIDOCAINE HCL (PF) 1 % IJ SOLN
INTRAMUSCULAR | Status: DC | PRN
  Administered 2018-03-24: 20 mL

## 2018-03-24 MED ORDER — HEPARIN (PORCINE) IN NACL 2-0.9 UNIT/ML-% IJ SOLN
INTRAMUSCULAR | Status: AC
  Filled 2018-03-24: qty 1000

## 2018-03-24 MED ORDER — SODIUM CHLORIDE 0.9% FLUSH: 3.0000 mL | INTRAVENOUS | Status: DC | PRN

## 2018-03-24 MED ORDER — IOPAMIDOL (ISOVUE-370) INJECTION 76%
INTRAVENOUS | Status: AC
  Filled 2018-03-24: qty 125

## 2018-03-24 MED ORDER — SODIUM CHLORIDE 0.9% FLUSH
3.0000 mL | Freq: Two times a day (BID) | INTRAVENOUS | Status: DC
  Administered 2018-03-24: 3 mL via INTRAVENOUS

## 2018-03-24 MED ORDER — HEPARIN (PORCINE) IN NACL 2-0.9 UNIT/ML-% IJ SOLN
INTRAMUSCULAR | Status: AC | PRN
  Administered 2018-03-24 (×2): 500 mL

## 2018-03-24 MED ORDER — ACETAMINOPHEN 325 MG PO TABS: 650.0000 mg | ORAL_TABLET | ORAL | Status: DC | PRN

## 2018-03-24 MED ORDER — MIDAZOLAM HCL 2 MG/2ML IJ SOLN
INTRAMUSCULAR | Status: AC
  Filled 2018-03-24: qty 2

## 2018-03-24 MED ORDER — LIDOCAINE HCL 1 % IJ SOLN
INTRAMUSCULAR | Status: AC
  Filled 2018-03-24: qty 20

## 2018-03-24 MED ORDER — SODIUM CHLORIDE 0.9 % WEIGHT BASED INFUSION
0.5000 mL/kg/h | INTRAVENOUS | Status: DC
  Administered 2018-03-24 (×2): 0.5 mL/kg/h via INTRAVENOUS

## 2018-03-24 MED ORDER — ONDANSETRON HCL 4 MG/2ML IJ SOLN: 4.0000 mg | Freq: Four times a day (QID) | INTRAMUSCULAR | Status: DC | PRN

## 2018-03-24 MED ORDER — IOPAMIDOL (ISOVUE-370) INJECTION 76%
INTRAVENOUS | Status: DC | PRN
  Administered 2018-03-24: 55 mL via INTRAVENOUS

## 2018-03-24 MED ORDER — FENTANYL CITRATE (PF) 100 MCG/2ML IJ SOLN
INTRAMUSCULAR | Status: AC
  Filled 2018-03-24: qty 2

## 2018-03-24 SURGICAL SUPPLY — 7 items
CATH INFINITI 5FR MULTPACK ANG (CATHETERS) ×2 IMPLANT
KIT HEART LEFT (KITS) ×2 IMPLANT
PACK CARDIAC CATHETERIZATION (CUSTOM PROCEDURE TRAY) ×2 IMPLANT
SHEATH AVANTI 11CM 5FR (SHEATH) ×2 IMPLANT
TRANSDUCER W/STOPCOCK (MISCELLANEOUS) ×2 IMPLANT
WIRE EMERALD 3MM-J .035X150CM (WIRE) ×2 IMPLANT
WIRE HI TORQ VERSACORE-J 145CM (WIRE) ×2 IMPLANT

## 2018-03-24 NOTE — Progress Notes (Signed)
Informed by central tele at 0005 that pt had a 6-beat run of V-tach.  On assessment, pt is resting comfortably and asymptomatic.  Rhythm is currently a. fib in the high 90's-low 100's.  Paged IMTS provider Nedrud, who issued no new orders.  Will continue to monitor.

## 2018-03-24 NOTE — Progress Notes (Signed)
Progress Note  Patient Name: [name] Date of Encounter: 03/24/2018  [facility]  Subjective   Pt denies CP or dyspnea  Inpatient Medications    Scheduled Meds: . [MAR Hold] allopurinol  450 mg Oral Daily  . [MAR Hold] amoxicillin-clavulanate  1 tablet Oral Q12H  . [MAR Hold] atorvastatin  80 mg Oral QHS  . [MAR Hold] Chlorhexidine Gluconate Cloth  6 each Topical Daily  . [MAR Hold] clopidogrel  75 mg Oral Daily  . [MAR Hold] divalproex  1,000 mg Oral QHS  . [MAR Hold] ezetimibe  10 mg Oral Daily  . [MAR Hold] fenofibrate  160 mg Oral Daily  . [MAR Hold] furosemide  40 mg Oral Daily  . [MAR Hold] mouth rinse  15 mL Mouth Rinse BID  . [MAR Hold] metoprolol tartrate  100 mg Oral BID  . [MAR Hold] mupirocin ointment  1 application Nasal BID  . [MAR Hold] pantoprazole  40 mg Oral BID  . sodium chloride flush  3 mL Intravenous Q12H   Continuous Infusions: . sodium chloride    . sodium chloride 75 mL/hr at 03/24/18 1554  . sodium chloride 0.5 mL/kg/hr (03/24/18 1448)  . heparin Stopped (03/24/18 1430)   PRN Meds: sodium chloride, [MAR Hold] acetaminophen **OR** [MAR Hold] acetaminophen, [MAR Hold] diclofenac sodium, [MAR Hold] ipratropium-albuterol, [MAR Hold] nitroGLYCERIN, [MAR Hold] ondansetron (ZOFRAN) IV, [MAR Hold] oxyCODONE, sodium chloride flush   Vital Signs    Vitals:   03/24/18 1550 03/24/18 1555 03/24/18 1600 03/24/18 1605  BP: 124/78 118/83 119/81 123/78  Pulse: 73 83 79 (!) 122  Resp: 18 17 16 17   Temp:      TempSrc:      SpO2: 97% 97% 97% 99%  Weight:      Height:        Intake/Output Summary (Last 24 hours) at 03/24/2018 1631 Last data filed at 03/24/2018 1400 Gross per 24 hour  Intake 349.53 ml  Output 1745 ml  Net -1395.47 ml   Filed Weights   03/22/18 1239 03/23/18 0457 03/24/18 0500  Weight: 167 lb (75.8 kg) 168 lb 6.9 oz (76.4 kg) 165 lb 3.2 oz (74.9 kg)   Physical Exam   GEN: WD/WN NAD Neck: supple Cardiac:  irregular Respiratory: CTA anteriorly GI: Soft, NT/ND MS: No edema Neuro:  grossly intact Psych: Flat affect   Labs    Chemistry Recent Labs  Lab 03/18/18 1314  03/22/18 1036 03/23/18 0915 03/24/18 0653  NA 136   < > 132* 133* 134*  K 4.4   < > 3.2* 4.0 3.7  CL 104   < > 98* 102 102  CO2 20*   < > 22 21* 23  GLUCOSE 78   < > 87 115* 105*  BUN 21*   < > 21* 19 19  CREATININE 2.41*   < > 0.82 0.80 0.81  CALCIUM 9.0   < > 8.6* 8.8* 8.6*  PROT 6.4*  --   --   --   --   ALBUMIN 4.1  --   --   --   --   AST 19  --   --   --   --   ALT 11*  --   --   --   --   ALKPHOS 48  --   --   --   --   BILITOT 0.9  --   --   --   --   GFRNONAA 27*   < > >  60 >60 >60  GFRAA 31*   < > >60 >60 >60  ANIONGAP 12   < > 12 10 9    < > = values in this interval not displayed.     Hematology Recent Labs  Lab 03/22/18 1036 03/23/18 0915 03/24/18 0653  WBC 5.7 6.9 8.7  RBC 3.56* 3.67* 3.63*  HGB 9.2* 9.4* 9.3*  HCT 28.2* 29.7* 29.5*  MCV 79.2 80.9 81.3  MCH 25.8* 25.6* 25.6*  MCHC 32.6 31.6 31.5  RDW 16.4* 16.5* 16.5*  PLT 167 164 162    Cardiac Enzymes Recent Labs  Lab 03/20/18 2255 03/21/18 0216 03/21/18 0753 03/21/18 1443  TROPONINI 1.07* 1.14* 0.90* 0.70*     Cardiac Studies   03/27/17-EF 35-40%, decreased from prior study of 55%.  Aortic valve likely bicuspid with mild aortic stenosis.  Patient Profile     62 y.o. male admitted with anemia, coffee-ground emesis, recent PCI, CAD post CABG and new paroxysmal atrial fibrillation  Assessment & Plan    1 Paroxysmal atrial fibrillation-patient remains in atrial fibrillation today.  We will continue metoprolol for rate control.  We will continue heparin and transition to apixaban tomorrow.  If atrial fibrillation persists would plan to proceed with cardioversion in 3-4 weeks.  2 Non-ST elevation myocardial infarction, CAD post CABG-patient is now status post cardiac catheterization.  Results noted.  Plan is medical therapy.   Given prior PCI will continue Plavix.  However I will not resume aspirin. Continue statin and metoprolol.  3 Chronic systolic heart failure-patient is euvolemic on examination.  We will continue with present dose of Lasix.  I will change metoprolol to Toprol.  Ejection fraction is 20% by by cath.  Echocardiogram and 30%.  If blood pressure stable will begin low-dose Entresto tomorrow.  Titrate medications as an outpatient.  Repeat echocardiogram in 3 months.  If ejection fraction less than 35% would need ICD.  4 Aspiration pneumonia-antibiotics per primary care.  5 Acute kidney injury-renal function improved.  6 PVD post femoropopliteal bypass  7 Bicuspid aortic valve-patient will need follow-up echoes in the future.   For questions or updates, please contact [facility] Please consult [email or web address] for contact info under Cardiology/STEMI.      Signed, Olga Millers, MD  03/24/2018, 4:31 PM

## 2018-03-24 NOTE — Progress Notes (Signed)
ANTICOAGULATION CONSULT NOTE -Follow up  Pharmacy Consult for Heparin  Indication: chest pain/ACS  Allergies  Allergen Reactions  . Prednisone Other (See Comments)    Causes steroid rage (takes regularly at home though)  . Lisinopril Cough    Patient Measurements: Height: 5' 9.5" (176.5 cm) Weight: 165 lb 3.2 oz (74.9 kg) IBW/kg (Calculated) : 71.85 Heparin Dosing Weight: 75.8 kg  Vital Signs: Temp: 98.1 F (36.7 C) (04/01 0514) BP: 125/82 (04/01 0514) Pulse Rate: 93 (04/01 0514)  Labs: Recent Labs    03/21/18 1443  03/22/18 1036  03/23/18 0915 03/23/18 1456 03/24/18 0653  HGB  --    < > 9.2*  --  9.4*  --  9.3*  HCT  --    < > 28.2*  --  29.7*  --  29.5*  PLT  --    < > 167  --  164  --  162  LABPROT  --   --   --   --   --   --  15.4*  INR  --   --   --   --   --   --  1.23  HEPARINUNFRC  --    < >  --    < > 0.34 0.31 0.44  CREATININE  --   --  0.82  --  0.80  --  0.81  TROPONINI 0.70*  --   --   --   --   --   --    < > = values in this interval not displayed.    Estimated Creatinine Clearance: 94.9 mL/min (by C-G formula based on SCr of 0.81 mg/dL).   Medical History: Past Medical History:  Diagnosis Date  . Acute myocardial infarction of other inferior wall, initial episode of care   . Alcohol abuse   . Anxiety   . Arthritis   . Barrett's esophagus   . Bipolar 2 disorder (HCC)   . CAD (coronary artery disease)   . CHF (congestive heart failure) (HCC)   . Coronary artery disease   . Depression   . Diverticulosis   . GERD (gastroesophageal reflux disease)   . Gout   . Hyperlipemia   . Hypertension   . MI (myocardial infarction) (HCC)   . PAD (peripheral artery disease) (HCC)    Moderate LE atherosclerosis.  . Pneumonia   . S/P right coronary artery (RCA) stent placement 03/04/2012    Medications:  Medications Prior to Admission  Medication Sig Dispense Refill Last Dose  . acetaminophen (TYLENOL) 500 MG tablet Take 500 mg by mouth every 6  (six) hours as needed.   unknown at prn  . albuterol (PROVENTIL HFA;VENTOLIN HFA) 108 (90 BASE) MCG/ACT inhaler Inhale 2 puffs into the lungs every 6 (six) hours as needed. WHEEZING   03/15/2018 at prn  . allopurinol (ZYLOPRIM) 100 MG tablet Take 450 mg by mouth daily.    03/17/2018 at Unknown time  . aspirin EC 81 MG tablet Take 81 mg by mouth daily.   03/17/2018 at Unknown time  . atorvastatin (LIPITOR) 80 MG tablet Take 80 mg by mouth at bedtime.    03/17/2018 at Unknown time  . clopidogrel (PLAVIX) 75 MG tablet Take 75 mg by mouth daily.   03/17/2018 at Unknown time  . divalproex (DEPAKOTE) 500 MG DR tablet Take 1,000 mg by mouth at bedtime.    03/17/2018 at Unknown time  . ezetimibe (ZETIA) 10 MG tablet Take 10 mg by mouth daily.  03/17/2018 at Unknown time  . fenofibrate (TRICOR) 145 MG tablet Take 145 mg by mouth daily.    03/17/2018 at Unknown time  . isosorbide mononitrate (IMDUR) 30 MG 24 hr tablet Take 30 mg by mouth daily.   03/17/2018 at Unknown time  . losartan (COZAAR) 50 MG tablet Take 50 mg by mouth daily.   03/17/2018 at Unknown time  . metoprolol (LOPRESSOR) 25 MG tablet Take 1 tablet (25 mg total) by mouth 2 (two) times daily. (Patient taking differently: Take 300 mg by mouth daily. ) 60 tablet 1 03/17/2018 at 2230  . nitroGLYCERIN (NITROSTAT) 0.4 MG SL tablet Place 0.4 mg under the tongue every 5 (five) minutes as needed for chest pain. Repeat every 5 minutes up to 3 doses.  If no relief seek medical help (Do not take Cialis, Viagra or Levitra while you are on this medication)   03/15/2018 at prn  . Omega-3 Fatty Acids (FISH OIL) 1000 MG CAPS Take 2,000 mg by mouth 3 (three) times daily.    03/17/2018 at Unknown time  . oxyCODONE (OXY IR/ROXICODONE) 5 MG immediate release tablet Take 5 mg by mouth every 6 (six) hours as needed for severe pain.   03/16/2018 at prn  . pantoprazole (PROTONIX) 40 MG tablet Take 40 mg by mouth 2 (two) times daily.   03/17/2018 at Unknown time    Assessment: 62  yo man continuing on heparin for CP and elevated troponin.  He has a h/o GI bleed and Hg and PTLC are low. Admitted with coffee-ground emesis with recent PCI, CAD post CABG.     Cardiology consulted, note possible need for cardiac cath, S/p EGD 3/30=> No fresh or old blood in the UGI tract.  GI okay'd to restart heparin on 3/30 and noted okay to use whatever blood thinners or antiplatelet agents needed to treat CAD.   Heparin level remains therapeutic on 2000 units/hr. CBC stable. No bleeding documented.  Goal of Therapy:  Heparin level 0.3-0.5 units/hr Monitor platelets by anticoagulation protocol: Yes   Plan:  Continue IV heparin at 2000 units/hr Monitor daily heparin level and CBC, s/sx bleeding Follow-up anticoag plans after cath.  [employer], Pharm.D., BCPS Clinical Pharmacist Pager: [phone] Clinical phone for 03/24/2018 from 8:30-4:00 is [phone]. After 4pm, please call Main [facility] [phone]) for assistance. 03/24/2018 11:18 AM

## 2018-03-24 NOTE — Progress Notes (Signed)
Subjective:  No complaints this morning, denies chest pain, shortness of breath. Overnight, HR around 105, remains in afib. Net negative 1.1 L in last 24 hrs, weight down to 165 lbs, currently on lasix 40 daily. He has been NPO in anticipation of cath today.   Objective:  Vital signs in last 24 hours: Vitals:   03/23/18 1414 03/23/18 2056 03/24/18 0500 03/24/18 0514  BP: 131/79 99/66  125/82  Pulse: 70 (!) 48  93  Resp: 16 20  20   Temp: 98.2 F (36.8 C) 98.7 F (37.1 C)  98.1 F (36.7 C)  TempSrc: Oral     SpO2: 97% 99%  97%  Weight:   165 lb 3.2 oz (74.9 kg)   Height:       GEN: Lying in bed in NAD CV: irregularly irregular, tachycardic, no m/r/g RESP: R sided basilar crackles, breath sounds otherwise clear. No increased work of breathing or resp distress  ABD: Soft, nontender, nondistended, +BS EXT: no LE edema PSYCH: Calm and pleasant  Assessment/Plan:  [name] is a 62 yo male with PMH of HFrEF (35-40%), G2DD, CAD (s/p CABG x5 in 2009, BMS SVG-RCA x2 in 2013 and 2014, and DES 2018) PAD s/p fem-pop bypass, bipolar disorder, and chronic shoulder pain who presents with ?hematemesis. No recurrence of hematemesis while here, however he is being treated for aspiration pneumonia given right-sided infiltrates on CXR and history of N/V and ingestion of unknown pain medication/sedation. Newly noted afib with RVR while inpatient - required extra doses of IV metoprolol on top of increased PO metoprolol. Also had an episode of chest pressure. Troponin elevated and EKG without acute ischemic changes, concerning for NSTEMI.  NSTEMI CAD s/p CABG and RCA BMS 2013, 2014 and DES 07/2017 PAD s/p fem-pop bypass Had an episode of chest pressure during admission. Troponin elevated at 1.07 -> 1.14 -> 0.90 and EKG without acute ischemic changes. Started on IV heparin drip. NSTEMI could be 2/2 demand in the setting of afib with RVR and acute illness. He does have an extensive cardiac history and ASA  and Plavix were initially held due to concern for GI bleed. He has not had recurrence of hematemesis while here, Hb stable, and had recent DES placed. Plavix 75 daily has been restarted per Cardiology recommendations, planning to have cath performed today.  - Cardiology consulted; appreciate their assistance - Continue IV heparin drip - F/u cath results and post-procedure recommendations  - Cont Plavix 75 mg  - Continue home atorvastatin 80mg  daily, ezetimibe 10mg  daily  Afib with RVR New diagnosis. HR better controlled overnight with rates around 105, on metoprolol 100 mg BID. Currently on IV heparin drip for NSTEMI. CHADS2-VASc score of at least 3, does have GI bleed and CAD hx as considerations for anti-coag choices--Eliquis and plavix combination  - Cardiology consulted; appreciate their recommendations - Continue telemetry - Cont PO metoprolol 100mg  BID  Pneumonia, presumed aspiration Patient with ingestion of unknown pain medicine.  CXR with right sided infiltrates consistent with pneumonia, blood cultures no growth final. Satting well on RA. Currently on augmentin. Afebrile. PO Augmentin 875-125mg  BID x 7 days total (end date of 4/3)   - PO augmentin 875-125mg  daily (end date 4/3)  N/V, resolved Hx of severe gastritis (EGD 2005), Barrett's esophagus, previous GI bleed Concerning for GI bleed on admission, with history of multiple GI issues and use of ASA and plavix for CAD and ibuprofen for pain. Denies melena or hematochezia. No abdominal pain or recurrence  of N/V. EGD without signs of bleeding on 3/30. Hgb stable - protonix 40mg  BID - IV Zofran PRN - CBC  Dyspnea, improved after diuresis HFrEF (03/2017 TTE: EF 35-40%, G2DD) HTN Home regimen includes imdur 30mg  daily, losartan 50mg  daily, and metoprolol 25mg  BID. Net -1.5L since admission. Cardiology may transition to entresto following cath which would lead to decreased Lasix dose - Holding home imdur 30mg  daily, losartan 50mg   daily given normotension - May start entresto following cath per cards - Continue metoprolol 100mg  BID - Daily weights, Strict I/Os - PO lasix 40mg  daily, adjust as needed   Bipolar disorder - Continue home divalproex 1000mg  QHS  Chronic Shoulder Pain [facility]. - Tylenol PRN - Voltaren gel 2g QID - Oxy IR 5mg  q12h PRN  Code Status: DNR/DNI Dispo: Anticipated discharge pending clinical improvement.  Stanley Carne, MD 03/24/2018, 11:37 AM Pager: Demetrius Charity [phone]

## 2018-03-24 NOTE — Progress Notes (Signed)
Site area: rt groin fa sheath Site Prior to Removal:  Level 0 Pressure Applied For: 20 minutes Manual:   yes Patient Status During Pull:  stable Post Pull Site:  Level  0 Post Pull Instructions Given:  yes Post Pull Pulses Present: dopplered rt dp Dressing Applied:  Gauze and tegaderm Bedrest begins @ 1505 Comments:

## 2018-03-24 NOTE — Interval H&P Note (Signed)
Cath Lab Visit (complete for each Cath Lab visit)  Clinical Evaluation Leading to the Procedure:   ACS: Yes.    Non-ACS:    Anginal Classification: CCS III  Anti-ischemic medical therapy: Maximal Therapy (2 or more classes of medications)  Non-Invasive Test Results: No non-invasive testing performed  Prior CABG: Previous CABG      History and Physical Interval Note:  03/24/2018 2:43 PM  [name] T Caven  has presented today for surgery, with the diagnosis of NSTEMI  The various methods of treatment have been discussed with the patient and family. After consideration of risks, benefits and other options for treatment, the patient has consented to  Procedure(s): LEFT HEART CATH AND CORONARY ANGIOGRAPHY (N/A) as a surgical intervention .  The patient's history has been reviewed, patient examined, no change in status, stable for surgery.  I have reviewed the patient's chart and labs.  Questions were answered to the patient's satisfaction.     Nanetta BattyJonathan Odesser Tourangeau

## 2018-03-25 ENCOUNTER — Encounter (HOSPITAL_COMMUNITY): Payer: Self-pay | Admitting: Cardiovascular Disease

## 2018-03-25 DIAGNOSIS — I5042 Chronic combined systolic (congestive) and diastolic (congestive) heart failure: Secondary | ICD-10-CM

## 2018-03-25 DIAGNOSIS — Z888 Allergy status to other drugs, medicaments and biological substances status: Secondary | ICD-10-CM

## 2018-03-25 LAB — BASIC METABOLIC PANEL
ANION GAP: 10 (ref 5–15)
BUN: 20 mg/dL (ref 6–20)
CHLORIDE: 104 mmol/L (ref 101–111)
CO2: 20 mmol/L — AB (ref 22–32)
Calcium: 8.6 mg/dL — ABNORMAL LOW (ref 8.9–10.3)
Creatinine, Ser: 0.84 mg/dL (ref 0.61–1.24)
GFR calc Af Amer: >60 mL/min (ref >60)
GFR calc non Af Amer: >60 mL/min (ref >60)
GLUCOSE: 85 mg/dL (ref 65–99)
POTASSIUM: 4.1 mmol/L (ref 3.5–5.1)
Sodium: 134 mmol/L — ABNORMAL LOW (ref 135–145)

## 2018-03-25 LAB — CBC
HEMATOCRIT: 28.5 % — AB (ref 39.0–52.0)
HEMOGLOBIN: 9.2 g/dL — AB (ref 13.0–17.0)
MCH: 26.4 pg (ref 26.0–34.0)
MCHC: 32.3 g/dL (ref 30.0–36.0)
MCV: 81.9 fL (ref 78.0–100.0)
PLATELETS: 164 10*3/uL (ref 150–400)
RBC: 3.48 MIL/uL — AB (ref 4.22–5.81)
RDW: 17.1 % — ABNORMAL HIGH (ref 11.5–15.5)
WBC: 6.1 10*3/uL (ref 4.0–10.5)

## 2018-03-25 MED ORDER — METOPROLOL SUCCINATE ER 100 MG PO TB24
100.0000 mg | ORAL_TABLET | Freq: Two times a day (BID) | ORAL | Status: DC
  Administered 2018-03-25: 100 mg via ORAL
  Filled 2018-03-25: qty 1

## 2018-03-25 MED ORDER — DICLOFENAC SODIUM 1 % TD GEL: 2.0000 g | Freq: Four times a day (QID) | TRANSDERMAL | 0 refills | Status: AC

## 2018-03-25 MED ORDER — FENOFIBRATE 160 MG PO TABS: 160.0000 mg | ORAL_TABLET | Freq: Every day | ORAL | 0 refills | Status: DC

## 2018-03-25 MED ORDER — FUROSEMIDE 40 MG PO TABS: 40.0000 mg | ORAL_TABLET | Freq: Every day | ORAL | 0 refills | Status: DC

## 2018-03-25 MED ORDER — APIXABAN 5 MG PO TABS
5.0000 mg | ORAL_TABLET | Freq: Two times a day (BID) | ORAL | Status: DC
  Administered 2018-03-25: 5 mg via ORAL
  Filled 2018-03-25: qty 1

## 2018-03-25 MED ORDER — LOSARTAN POTASSIUM 25 MG PO TABS: 25.0000 mg | ORAL_TABLET | Freq: Every day | ORAL | 0 refills | Status: DC

## 2018-03-25 MED ORDER — METOPROLOL SUCCINATE ER 100 MG PO TB24: 100.0000 mg | ORAL_TABLET | Freq: Two times a day (BID) | ORAL | 0 refills | Status: DC

## 2018-03-25 MED ORDER — METOPROLOL SUCCINATE ER 200 MG PO TB24: 200.0000 mg | ORAL_TABLET | Freq: Every day | ORAL | 0 refills | Status: DC

## 2018-03-25 MED ORDER — LOSARTAN POTASSIUM 25 MG PO TABS
25.0000 mg | ORAL_TABLET | Freq: Every day | ORAL | Status: DC
  Administered 2018-03-25: 25 mg via ORAL
  Filled 2018-03-25: qty 1

## 2018-03-25 MED ORDER — APIXABAN 5 MG PO TABS: 5.0000 mg | ORAL_TABLET | Freq: Two times a day (BID) | ORAL | 0 refills | Status: DC

## 2018-03-25 MED ORDER — AMOXICILLIN-POT CLAVULANATE 875-125 MG PO TABS: 1.0000 | ORAL_TABLET | Freq: Two times a day (BID) | ORAL | 0 refills | Status: DC

## 2018-03-25 MED FILL — Lidocaine HCl Local Inj 1%: INTRAMUSCULAR | Qty: 20 | Status: AC

## 2018-03-25 MED FILL — Heparin Sodium (Porcine) 2 Unit/ML in Sodium Chloride 0.9%: INTRAMUSCULAR | Qty: 1000 | Status: AC

## 2018-03-25 NOTE — Progress Notes (Signed)
Progress Note  Patient Name: Stanley FlattenRoger T Becker Date of Encounter: 03/25/2018  [facility]  Subjective   No chest pain or dyspnea  Inpatient Medications    Scheduled Meds: . allopurinol  450 mg Oral Daily  . amoxicillin-clavulanate  1 tablet Oral Q12H  . aspirin  81 mg Oral Daily  . atorvastatin  80 mg Oral QHS  . Chlorhexidine Gluconate Cloth  6 each Topical Daily  . clopidogrel  75 mg Oral Q breakfast  . divalproex  1,000 mg Oral QHS  . ezetimibe  10 mg Oral Daily  . fenofibrate  160 mg Oral Daily  . furosemide  40 mg Oral Daily  . mouth rinse  15 mL Mouth Rinse BID  . metoprolol tartrate  100 mg Oral BID  . mupirocin ointment  1 application Nasal BID  . pantoprazole  40 mg Oral BID  . sodium chloride flush  3 mL Intravenous Q12H   Continuous Infusions: . sodium chloride     PRN Meds: sodium chloride, acetaminophen, diclofenac sodium, ipratropium-albuterol, morphine injection, nitroGLYCERIN, ondansetron (ZOFRAN) IV, oxyCODONE, sodium chloride flush   Vital Signs    Vitals:   03/24/18 2341 03/25/18 0431 03/25/18 0800 03/25/18 1112  BP: 115/69 114/80 108/64 110/72  Pulse:  61 74 (!) 110  Resp: 20 18 18 18   Temp: 98.8 F (37.1 C) 98 F (36.7 C) 98.9 F (37.2 C) 98.8 F (37.1 C)  TempSrc: Oral Oral Oral Oral  SpO2: 96% 97% 97% 98%  Weight:  164 lb 3.9 oz (74.5 kg)    Height:        Intake/Output Summary (Last 24 hours) at 03/25/2018 1250 Last data filed at 03/25/2018 1045 Gross per 24 hour  Intake 600 ml  Output 1250 ml  Net -650 ml   Filed Weights   03/24/18 0500 03/24/18 1700 03/25/18 0431  Weight: 165 lb 3.2 oz (74.9 kg) 167 lb 5.3 oz (75.9 kg) 164 lb 3.9 oz (74.5 kg)   Physical Exam   GEN: WD/WN No acute distress Neck: supple, no JVD Cardiac: irregular, no murmur Respiratory: CTA anteriorly; no wheeze GI: Soft, NT/ND, no masses MS: No edema Neuro:  No focal findings   Labs    Chemistry Recent Labs  Lab 03/18/18 1314   03/23/18 0915 03/24/18 0653 03/25/18 0434  NA 136   < > 133* 134* 134*  K 4.4   < > 4.0 3.7 4.1  CL 104   < > 102 102 104  CO2 20*   < > 21* 23 20*  GLUCOSE 78   < > 115* 105* 85  BUN 21*   < > 19 19 20   CREATININE 2.41*   < > 0.80 0.81 0.84  CALCIUM 9.0   < > 8.8* 8.6* 8.6*  PROT 6.4*  --   --   --   --   ALBUMIN 4.1  --   --   --   --   AST 19  --   --   --   --   ALT 11*  --   --   --   --   ALKPHOS 48  --   --   --   --   BILITOT 0.9  --   --   --   --   GFRNONAA 27*   < > >60 >60 >60  GFRAA 31*   < > >60 >60 >60  ANIONGAP 12   < > 10 9 10    < > =  values in this interval not displayed.     Hematology Recent Labs  Lab 03/23/18 0915 03/24/18 0653 03/25/18 0434  WBC 6.9 8.7 6.1  RBC 3.67* 3.63* 3.48*  HGB 9.4* 9.3* 9.2*  HCT 29.7* 29.5* 28.5*  MCV 80.9 81.3 81.9  MCH 25.6* 25.6* 26.4  MCHC 31.6 31.5 32.3  RDW 16.5* 16.5* 17.1*  PLT 164 162 164    Cardiac Enzymes Recent Labs  Lab 03/20/18 2255 03/21/18 0216 03/21/18 0753 03/21/18 1443  TROPONINI 1.07* 1.14* 0.90* 0.70*     Cardiac Studies   03/27/17-EF 35-40%, decreased from prior study of 55%.  Aortic valve likely bicuspid with mild aortic stenosis.  Patient Profile     62 y.o. male admitted with anemia, coffee-ground emesis, recent PCI, CAD post CABG and new paroxysmal atrial fibrillation  Assessment & Plan    1 Paroxysmal atrial fibrillation-patient remains in atrial fibrillation today.  We will continue metoprolol for rate control.  DC heparin and begin apixaban.  If atrial fibrillation persists would plan to proceed with cardioversion in 3-4 weeks.  2 Non-ST elevation myocardial infarction, CAD post CABG-patient is now status post cardiac catheterization.  Results noted.  Plan is medical therapy.  Given prior PCI will continue Plavix.  We will not resume ASA given recent GI bleed and need for apixaban. Continue statin and metoprolol.  3 Chronic systolic heart failure-patient is euvolemic on  examination.  We will continue with present dose of Lasix.  Continue toprol. Ejection fraction is 20% by cath and 30 % by echocardiogram.  Begin cozaar 25 mg daily. Titrate medications as an outpatient.  Repeat echocardiogram in 3 months.  If ejection fraction less than 35% would need ICD.  4 Aspiration pneumonia-antibiotics per primary care.  5 Acute kidney injury-renal function improved.  6 PVD post femoropopliteal bypass  7 Bicuspid aortic valve-patient will need follow-up echoes in the future.   For questions or updates, please contact [facility] Please consult [email or web address] for contact info under Cardiology/STEMI.      Signed, Olga Millers, MD  03/25/2018, 12:50 PM

## 2018-03-25 NOTE — Plan of Care (Signed)
  Problem: Activity: Goal: Risk for activity intolerance will decrease Outcome: Progressing   Problem: Safety: Goal: Ability to remain free from injury will improve Outcome: Progressing   Problem: Respiratory: Goal: Ability to maintain adequate ventilation will improve Outcome: Progressing   

## 2018-03-25 NOTE — Progress Notes (Signed)
Discharged patient home with daughter.  Discussed discharge instructions including follow up and medications.    Sent instruction home with patient

## 2018-03-25 NOTE — Progress Notes (Signed)
[facility]  Due to insurance patient would be left paying full cost of medication (around $1,000).   [place] be able to get [facility].  Called MD- pharmacy is closed and [facility] not [facility] number to [facility] at this time.   MD will call patient in the morning to get information on whom to contact.  [facility].

## 2018-03-25 NOTE — Progress Notes (Signed)
Daughter told me during discharge that her Dad did not fully understand what DNR indicated.   Ask patient and he did want compression but not ventilation.  Told patient and daughter to let them know in the future for a limited code.

## 2018-03-25 NOTE — Progress Notes (Signed)
Subjective:  No acute events overnight, tolerated cardiac catheterization well without complication.  Denies chest pain or shortness of breath this morning.  Rate was better controlled her last 24 hours with rates around 100 or below per telemetry.  Objective:  Vital signs in last 24 hours: Vitals:   03/24/18 2100 03/24/18 2341 03/25/18 0431 03/25/18 0800  BP: 109/67 115/69 114/80 108/64  Pulse: (!) 114  61 74  Resp:  20 18 18   Temp:  98.8 F (37.1 C) 98 F (36.7 C) 98.9 F (37.2 C)  TempSrc:  Oral Oral Oral  SpO2:  96% 97% 97%  Weight:   164 lb 3.9 oz (74.5 kg)   Height:       GEN: Lying in bed in NAD CV: irregularly irregular, normal rate, no m/r/g RESP: R sided basilar crackles, breath sounds otherwise clear. No increased work of breathing or resp distress  ABD: Soft, nontender, nondistended, +BS EXT: no LE edema, no hematoma or bleeding around L inguinal cath site  [facility]: Calm and pleasant  Assessment/Plan:  Mr. Recupero is a 62 yo male with PMH of HFrEF (35-40%), G2DD, CAD (s/p CABG x5 in 2009, BMS SVG-RCA x2 in 2013 and 2014, and DES 2018) PAD s/p fem-pop bypass, bipolar disorder, and chronic shoulder pain who presents with ?hematemesis. No recurrence of hematemesis while here, however he is being treated for aspiration pneumonia given right-sided infiltrates on CXR and history of N/V and ingestion of unknown pain medication/sedation. Newly noted afib with RVR while inpatient - required extra doses of IV metoprolol on top of increased PO metoprolol. Also had an episode of chest pressure. Troponin elevated and EKG without acute ischemic changes, concerning for NSTEMI.  NSTEMI CAD s/p CABG and RCA BMS 2013, 2014 and DES 07/2017 PAD s/p fem-pop bypass Had an episode of chest pressure during admission. Troponin elevated at 1.07 -> 1.14 -> 0.90 and EKG without acute ischemic changes. Started on IV heparin drip. NSTEMI could be 2/2 demand in the setting of afib with RVR and acute  illness. He does have an extensive cardiac history and ASA and Plavix were initially held due to concern for GI bleed. He has not had recurrence of hematemesis while here, Hb stable, and had recent DES placed. Plavix 75 daily has been restarted per Cardiology recommendations. Cath redemonstrated his known CAD and medical management recommended.   - Cardiology consulted; appreciate their assistance - Cont Plavix 75 mg - Transition to oral anti-coag   - Continue home atorvastatin 80mg  daily, ezetimibe 10mg  daily  Afib with RVR New diagnosis. HR again improved overnight, on metoprolol 100 mg BID. Currently on IV heparin drip for prior NSTEMI. CHADS2-VASc score of at least 3, does have GI bleed and CAD hx as considerations for anti-coag choices--Eliquis and plavix combination is favored at this point. May be a cardioversion candidate after 3-4 weeks of anti-coagulation  - Cardiology consulted; appreciate their recommendations - Transition to oral anti-coagulant per cards  - Continue telemetry - Cont PO metoprolol 100mg  BID  Pneumonia, presumed aspiration Patient with ingestion of unknown pain medicine.  CXR with right sided infiltrates consistent with pneumonia, blood cultures no growth final. Satting well on RA. Currently on augmentin. Afebrile. PO Augmentin 875-125mg  BID x 7 days total (end date of 4/3)   - PO augmentin 875-125mg  BID (end date 4/3)  N/V, resolved Hx of severe gastritis (EGD 2005), Barrett's esophagus, previous GI bleed Concerning for GI bleed on admission, with history of multiple GI issues and  use of ASA and plavix for CAD and ibuprofen for pain. Denies melena or hematochezia. No abdominal pain or recurrence of N/V. EGD without signs of bleeding on 3/30. Hgb stable - protonix 40mg  BID - IV Zofran PRN - CBC  Dyspnea, improved after diuresis HFrEF (03/2017 TTE: EF 35-40%, G2DD) HTN Home regimen includes imdur 30mg  daily, losartan 50mg  daily, and metoprolol 25mg  BID. Net -3.1  L since admission. Cardiology may transition to entresto following cath which may lead to decreased Lasix dose needed. Based on EF, he may need an ICD in the future depending on EF at follow up in about 3 months.  - Holding home imdur 30mg  daily, losartan 50mg  daily given normotension - May start entresto per cards and if BP can tolerate  - Continue metoprolol 100mg  BID - Daily weights, Strict I/Os - PO lasix 40mg  daily, adjust as needed   Chronic Shoulder Pain [facility] by [facility]. - Tylenol PRN - Voltaren gel 2g QID - Oxy IR 5mg  q12h PRN  Code Status: DNR/DNI Dispo: Anticipated discharge pending clinical improvement.  Ginger CarneHarden, Ajani Schnieders, MD 03/25/2018, 11:09 AM Pager: Demetrius CharityP [phone]

## 2018-03-26 ENCOUNTER — Other Ambulatory Visit: Payer: Self-pay | Admitting: Internal Medicine

## 2018-03-26 MED ORDER — APIXABAN 5 MG PO TABS: 5.0000 mg | ORAL_TABLET | Freq: Two times a day (BID) | ORAL | 0 refills | Status: AC

## 2018-03-26 MED ORDER — FUROSEMIDE 40 MG PO TABS: 40.0000 mg | ORAL_TABLET | Freq: Every day | ORAL | 0 refills | Status: AC

## 2018-03-26 MED ORDER — ATORVASTATIN CALCIUM 80 MG PO TABS: 80.0000 mg | ORAL_TABLET | Freq: Every day | ORAL | 0 refills | Status: AC

## 2018-03-26 MED ORDER — FENOFIBRATE 160 MG PO TABS: 160.0000 mg | ORAL_TABLET | Freq: Every day | ORAL | 0 refills | Status: AC

## 2018-03-26 MED ORDER — LOSARTAN POTASSIUM 25 MG PO TABS: 25.0000 mg | ORAL_TABLET | Freq: Every day | ORAL | 0 refills | Status: AC

## 2018-03-26 MED ORDER — AMOXICILLIN-POT CLAVULANATE 875-125 MG PO TABS: 1.0000 | ORAL_TABLET | Freq: Two times a day (BID) | ORAL | 0 refills | Status: AC

## 2018-03-26 MED ORDER — METOPROLOL SUCCINATE ER 200 MG PO TB24: 200.0000 mg | ORAL_TABLET | Freq: Every day | ORAL | 0 refills | Status: AC

## 2018-03-26 MED ORDER — CLOPIDOGREL BISULFATE 75 MG PO TABS: 75.0000 mg | ORAL_TABLET | Freq: Every day | ORAL | 0 refills | Status: AC

## 2018-03-26 NOTE — Progress Notes (Signed)
Orders only encounter to re-send discharge prescriptions to pt preferred pharmacy

## 2018-04-02 ENCOUNTER — Telehealth (HOSPITAL_COMMUNITY): Payer: Self-pay

## 2018-04-02 NOTE — Telephone Encounter (Signed)
Patient was discharge on 03/25/2018. Passed referral to RN Navigator for review. Spoke with patient and patient stated he had follow up appt late last week. He could not remember the Dr or the date. Explained to patient we will call once reviewed for scheduling.

## 2018-04-18 ENCOUNTER — Telehealth (HOSPITAL_COMMUNITY): Payer: Self-pay

## 2018-04-18 NOTE — Telephone Encounter (Signed)
Called and spoke with patient in regards to Cardiac Rehab - Scheduled orientation on 06/12/18 at 1:30pm. Patient will attend the 1:15pm exc class. Mailed packet.

## 2018-06-05 ENCOUNTER — Telehealth (HOSPITAL_COMMUNITY): Payer: Self-pay | Admitting: Pharmacist

## 2018-06-11 ENCOUNTER — Telehealth (HOSPITAL_COMMUNITY): Payer: Self-pay

## 2018-06-12 ENCOUNTER — Ambulatory Visit (HOSPITAL_COMMUNITY): Payer: Non-veteran care

## 2018-06-14 NOTE — Telephone Encounter (Signed)
Cardiac Rehab - Pharmacy Resident Documentation   Patient unable to be reached after three call attempts. Please complete allergy verification and medication review during patient's cardiac rehab appointment.     

## 2018-06-18 ENCOUNTER — Ambulatory Visit (HOSPITAL_COMMUNITY): Payer: Non-veteran care

## 2018-06-20 ENCOUNTER — Ambulatory Visit (HOSPITAL_COMMUNITY): Payer: Non-veteran care

## 2018-06-23 ENCOUNTER — Ambulatory Visit (HOSPITAL_COMMUNITY): Payer: Non-veteran care

## 2018-06-25 ENCOUNTER — Ambulatory Visit (HOSPITAL_COMMUNITY): Payer: Non-veteran care

## 2018-06-27 ENCOUNTER — Ambulatory Visit (HOSPITAL_COMMUNITY): Payer: Non-veteran care

## 2018-06-27 ENCOUNTER — Telehealth (HOSPITAL_COMMUNITY): Payer: Self-pay

## 2018-06-27 NOTE — Telephone Encounter (Signed)
Patient called and would like to reschedule for CR. Patient would like to come in for orientation on 09/19/18 @ 8:30AM and attend the 11:15am exercise class.  Advise patient he would need to contact the [facility] to have them send over another authorization. He stated he will give them a call today.

## 2018-06-30 ENCOUNTER — Ambulatory Visit (HOSPITAL_COMMUNITY): Payer: Non-veteran care

## 2018-07-02 ENCOUNTER — Ambulatory Visit (HOSPITAL_COMMUNITY): Payer: Non-veteran care

## 2018-07-04 ENCOUNTER — Ambulatory Visit (HOSPITAL_COMMUNITY): Payer: Non-veteran care

## 2018-07-07 ENCOUNTER — Ambulatory Visit (HOSPITAL_COMMUNITY): Payer: Non-veteran care

## 2018-07-09 ENCOUNTER — Ambulatory Visit (HOSPITAL_COMMUNITY): Payer: Non-veteran care

## 2018-07-11 ENCOUNTER — Ambulatory Visit (HOSPITAL_COMMUNITY): Payer: Non-veteran care

## 2018-07-14 ENCOUNTER — Ambulatory Visit (HOSPITAL_COMMUNITY): Payer: Non-veteran care

## 2018-07-16 ENCOUNTER — Ambulatory Visit (HOSPITAL_COMMUNITY): Payer: Non-veteran care

## 2018-07-18 ENCOUNTER — Ambulatory Visit (HOSPITAL_COMMUNITY): Payer: Non-veteran care

## 2018-07-21 ENCOUNTER — Ambulatory Visit (HOSPITAL_COMMUNITY): Payer: Non-veteran care

## 2018-07-23 ENCOUNTER — Ambulatory Visit (HOSPITAL_COMMUNITY): Payer: Non-veteran care

## 2018-07-25 ENCOUNTER — Ambulatory Visit (HOSPITAL_COMMUNITY): Payer: Non-veteran care

## 2018-07-28 ENCOUNTER — Ambulatory Visit (HOSPITAL_COMMUNITY): Payer: Non-veteran care

## 2018-07-30 ENCOUNTER — Ambulatory Visit (HOSPITAL_COMMUNITY): Payer: Non-veteran care

## 2018-08-01 ENCOUNTER — Ambulatory Visit (HOSPITAL_COMMUNITY): Payer: Non-veteran care

## 2018-08-04 ENCOUNTER — Ambulatory Visit (HOSPITAL_COMMUNITY): Payer: Non-veteran care

## 2018-08-06 ENCOUNTER — Ambulatory Visit (HOSPITAL_COMMUNITY): Payer: Non-veteran care

## 2018-08-08 ENCOUNTER — Ambulatory Visit (HOSPITAL_COMMUNITY): Payer: Non-veteran care

## 2018-08-11 ENCOUNTER — Ambulatory Visit (HOSPITAL_COMMUNITY): Payer: Non-veteran care

## 2018-08-13 ENCOUNTER — Ambulatory Visit (HOSPITAL_COMMUNITY): Payer: Non-veteran care

## 2018-08-15 ENCOUNTER — Ambulatory Visit (HOSPITAL_COMMUNITY): Payer: Non-veteran care

## 2018-08-15 ENCOUNTER — Telehealth (HOSPITAL_COMMUNITY): Payer: Self-pay

## 2018-08-18 ENCOUNTER — Ambulatory Visit (HOSPITAL_COMMUNITY): Payer: Non-veteran care

## 2018-08-18 ENCOUNTER — Telehealth (HOSPITAL_COMMUNITY): Payer: Self-pay

## 2018-08-18 NOTE — Telephone Encounter (Signed)
Patient called and left vm Friday afternoon stating he is cancelling appts with Cardiac Rehab and the [facility] did not approve. Closed referral.

## 2018-08-18 NOTE — Telephone Encounter (Signed)
Cardiac Rehab - Pharmacy Resident Documentation   Patient unable to be reached after three call attempts. Please complete allergy verification and medication review during patient's cardiac rehab appointment.     

## 2018-08-19 ENCOUNTER — Ambulatory Visit (HOSPITAL_COMMUNITY): Payer: Non-veteran care

## 2018-08-20 ENCOUNTER — Ambulatory Visit (HOSPITAL_COMMUNITY): Payer: Non-veteran care

## 2018-08-22 ENCOUNTER — Ambulatory Visit (HOSPITAL_COMMUNITY): Payer: Non-veteran care

## 2018-08-27 ENCOUNTER — Ambulatory Visit (HOSPITAL_COMMUNITY): Payer: Non-veteran care

## 2018-08-29 ENCOUNTER — Ambulatory Visit (HOSPITAL_COMMUNITY): Payer: Non-veteran care

## 2018-09-01 ENCOUNTER — Ambulatory Visit (HOSPITAL_COMMUNITY): Payer: Non-veteran care

## 2018-09-03 ENCOUNTER — Ambulatory Visit (HOSPITAL_COMMUNITY): Payer: Non-veteran care

## 2018-09-05 ENCOUNTER — Ambulatory Visit (HOSPITAL_COMMUNITY): Payer: Non-veteran care

## 2018-09-08 ENCOUNTER — Ambulatory Visit (HOSPITAL_COMMUNITY): Payer: Non-veteran care

## 2018-09-10 ENCOUNTER — Ambulatory Visit (HOSPITAL_COMMUNITY): Payer: Non-veteran care

## 2018-09-12 ENCOUNTER — Ambulatory Visit (HOSPITAL_COMMUNITY): Payer: Non-veteran care

## 2018-09-15 ENCOUNTER — Ambulatory Visit (HOSPITAL_COMMUNITY): Payer: Non-veteran care

## 2018-09-17 ENCOUNTER — Ambulatory Visit (HOSPITAL_COMMUNITY): Payer: Non-veteran care

## 2018-09-19 ENCOUNTER — Ambulatory Visit (HOSPITAL_COMMUNITY): Payer: Non-veteran care

## 2018-09-22 ENCOUNTER — Ambulatory Visit (HOSPITAL_COMMUNITY): Payer: Non-veteran care

## 2018-09-24 ENCOUNTER — Ambulatory Visit (HOSPITAL_COMMUNITY): Payer: Non-veteran care

## 2018-09-26 ENCOUNTER — Ambulatory Visit (HOSPITAL_COMMUNITY): Payer: Non-veteran care

## 2018-09-29 ENCOUNTER — Ambulatory Visit (HOSPITAL_COMMUNITY): Payer: Non-veteran care

## 2018-10-01 ENCOUNTER — Ambulatory Visit (HOSPITAL_COMMUNITY): Payer: Non-veteran care

## 2018-10-03 ENCOUNTER — Ambulatory Visit (HOSPITAL_COMMUNITY): Payer: Non-veteran care

## 2018-10-06 ENCOUNTER — Ambulatory Visit (HOSPITAL_COMMUNITY): Payer: Non-veteran care

## 2018-10-08 ENCOUNTER — Ambulatory Visit (HOSPITAL_COMMUNITY): Payer: Non-veteran care

## 2018-10-10 ENCOUNTER — Ambulatory Visit (HOSPITAL_COMMUNITY): Payer: Non-veteran care

## 2018-10-13 ENCOUNTER — Ambulatory Visit (HOSPITAL_COMMUNITY): Payer: Non-veteran care

## 2018-10-15 ENCOUNTER — Ambulatory Visit (HOSPITAL_COMMUNITY): Payer: Non-veteran care

## 2018-10-17 ENCOUNTER — Ambulatory Visit (HOSPITAL_COMMUNITY): Payer: Non-veteran care

## 2018-10-20 ENCOUNTER — Ambulatory Visit (HOSPITAL_COMMUNITY): Payer: Non-veteran care

## 2018-10-22 ENCOUNTER — Ambulatory Visit (HOSPITAL_COMMUNITY): Payer: Non-veteran care

## 2018-10-24 ENCOUNTER — Ambulatory Visit (HOSPITAL_COMMUNITY): Payer: Non-veteran care

## 2018-10-27 ENCOUNTER — Ambulatory Visit (HOSPITAL_COMMUNITY): Payer: Non-veteran care

## 2018-10-29 ENCOUNTER — Ambulatory Visit (HOSPITAL_COMMUNITY): Payer: Non-veteran care

## 2018-10-31 ENCOUNTER — Ambulatory Visit (HOSPITAL_COMMUNITY): Payer: Non-veteran care

## 2018-11-03 ENCOUNTER — Ambulatory Visit (HOSPITAL_COMMUNITY): Payer: Non-veteran care

## 2018-11-05 ENCOUNTER — Ambulatory Visit (HOSPITAL_COMMUNITY): Payer: Non-veteran care

## 2018-11-07 ENCOUNTER — Ambulatory Visit (HOSPITAL_COMMUNITY): Payer: Non-veteran care

## 2018-11-10 ENCOUNTER — Ambulatory Visit (HOSPITAL_COMMUNITY): Payer: Non-veteran care

## 2018-11-12 ENCOUNTER — Ambulatory Visit (HOSPITAL_COMMUNITY): Payer: Non-veteran care

## 2018-11-14 ENCOUNTER — Ambulatory Visit (HOSPITAL_COMMUNITY): Payer: Non-veteran care

## 2018-11-17 ENCOUNTER — Ambulatory Visit (HOSPITAL_COMMUNITY): Payer: Non-veteran care

## 2018-11-19 ENCOUNTER — Ambulatory Visit (HOSPITAL_COMMUNITY): Payer: Non-veteran care

## 2018-11-21 ENCOUNTER — Ambulatory Visit (HOSPITAL_COMMUNITY): Payer: Non-veteran care

## 2018-11-24 ENCOUNTER — Ambulatory Visit (HOSPITAL_COMMUNITY): Payer: Non-veteran care

## 2018-11-26 ENCOUNTER — Ambulatory Visit (HOSPITAL_COMMUNITY): Payer: Non-veteran care

## 2018-11-28 ENCOUNTER — Ambulatory Visit (HOSPITAL_COMMUNITY): Payer: Non-veteran care

## 2019-02-15 ENCOUNTER — Other Ambulatory Visit: Payer: Self-pay

## 2019-02-15 ENCOUNTER — Encounter (HOSPITAL_COMMUNITY): Payer: Self-pay | Admitting: Emergency Medicine

## 2019-02-15 ENCOUNTER — Emergency Department (HOSPITAL_COMMUNITY)
Admission: EM | Admit: 2019-02-15 | Discharge: 2019-02-15 | Disposition: A | Discharge status: Home / Self Care | Payer: Non-veteran care | Attending: Emergency Medicine | Admitting: Emergency Medicine

## 2019-02-15 ENCOUNTER — Emergency Department (HOSPITAL_COMMUNITY): Payer: Non-veteran care

## 2019-02-15 DIAGNOSIS — I2510 Atherosclerotic heart disease of native coronary artery without angina pectoris: Secondary | ICD-10-CM | POA: Diagnosis not present

## 2019-02-15 DIAGNOSIS — Z7902 Long term (current) use of antithrombotics/antiplatelets: Secondary | ICD-10-CM | POA: Insufficient documentation

## 2019-02-15 DIAGNOSIS — I252 Old myocardial infarction: Secondary | ICD-10-CM | POA: Diagnosis not present

## 2019-02-15 DIAGNOSIS — Z79899 Other long term (current) drug therapy: Secondary | ICD-10-CM | POA: Insufficient documentation

## 2019-02-15 DIAGNOSIS — I5023 Acute on chronic systolic (congestive) heart failure: Secondary | ICD-10-CM | POA: Insufficient documentation

## 2019-02-15 DIAGNOSIS — I110 Hypertensive heart disease with heart failure: Secondary | ICD-10-CM | POA: Diagnosis not present

## 2019-02-15 DIAGNOSIS — F17210 Nicotine dependence, cigarettes, uncomplicated: Secondary | ICD-10-CM | POA: Insufficient documentation

## 2019-02-15 DIAGNOSIS — R0602 Shortness of breath: Secondary | ICD-10-CM | POA: Diagnosis present

## 2019-02-15 DIAGNOSIS — Z7901 Long term (current) use of anticoagulants: Secondary | ICD-10-CM | POA: Diagnosis not present

## 2019-02-15 DIAGNOSIS — I509 Heart failure, unspecified: Secondary | ICD-10-CM

## 2019-02-15 DIAGNOSIS — Z951 Presence of aortocoronary bypass graft: Secondary | ICD-10-CM | POA: Diagnosis not present

## 2019-02-15 LAB — CBC WITH DIFFERENTIAL/PLATELET
Abs Immature Granulocytes: 0.01 10*3/uL (ref 0.00–0.07)
BASOS ABS: 0 10*3/uL (ref 0.0–0.1)
BASOS PCT: 0 %
Eosinophils Absolute: 0.1 10*3/uL (ref 0.0–0.5)
Eosinophils Relative: 1 %
HCT: 35 % — ABNORMAL LOW (ref 39.0–52.0)
Hemoglobin: 10.5 g/dL — ABNORMAL LOW (ref 13.0–17.0)
Immature Granulocytes: 0 %
Lymphocytes Relative: 22 %
Lymphs Abs: 1.3 10*3/uL (ref 0.7–4.0)
MCH: 26.8 pg (ref 26.0–34.0)
MCHC: 30 g/dL (ref 30.0–36.0)
MCV: 89.3 fL (ref 80.0–100.0)
MONO ABS: 0.5 10*3/uL (ref 0.1–1.0)
Monocytes Relative: 9 %
NEUTROS ABS: 3.9 10*3/uL (ref 1.7–7.7)
NEUTROS PCT: 68 %
NRBC: 0 % (ref 0.0–0.2)
PLATELETS: 151 10*3/uL (ref 150–400)
RBC: 3.92 MIL/uL — AB (ref 4.22–5.81)
RDW: 15.8 % — ABNORMAL HIGH (ref 11.5–15.5)
WBC: 5.8 10*3/uL (ref 4.0–10.5)

## 2019-02-15 LAB — COMPREHENSIVE METABOLIC PANEL
ALT: 7 U/L (ref 0–44)
ANION GAP: 7 (ref 5–15)
AST: 10 U/L — ABNORMAL LOW (ref 15–41)
Albumin: 3.8 g/dL (ref 3.5–5.0)
Alkaline Phosphatase: 69 U/L (ref 38–126)
BUN: 13 mg/dL (ref 8–23)
CHLORIDE: 111 mmol/L (ref 98–111)
CO2: 22 mmol/L (ref 22–32)
CREATININE: 0.65 mg/dL (ref 0.61–1.24)
Calcium: 8.8 mg/dL — ABNORMAL LOW (ref 8.9–10.3)
GFR calc non Af Amer: >60 mL/min (ref >60)
Glucose, Bld: 104 mg/dL — ABNORMAL HIGH (ref 70–99)
POTASSIUM: 3.5 mmol/L (ref 3.5–5.1)
SODIUM: 140 mmol/L (ref 135–145)
Total Bilirubin: 0.7 mg/dL (ref 0.3–1.2)
Total Protein: 6.6 g/dL (ref 6.5–8.1)

## 2019-02-15 LAB — BRAIN NATRIURETIC PEPTIDE: B Natriuretic Peptide: 3575.7 pg/mL — ABNORMAL HIGH (ref 0.0–100.0)

## 2019-02-15 LAB — LACTIC ACID, PLASMA: LACTIC ACID, VENOUS: 1.8 mmol/L (ref 0.5–1.9)

## 2019-02-15 LAB — TROPONIN I: TROPONIN I: 0.03 ng/mL — AB (ref <0.03)

## 2019-02-15 MED ORDER — FUROSEMIDE 10 MG/ML IJ SOLN
40.0000 mg | Freq: Once | INTRAMUSCULAR | Status: AC
Start: 2019-02-15 — End: 2019-02-15
  Administered 2019-02-15: 40 mg via INTRAVENOUS
  Filled 2019-02-15: qty 4

## 2019-02-15 MED ORDER — SODIUM CHLORIDE 0.9 % IV BOLUS
500.0000 mL | Freq: Once | INTRAVENOUS | Status: AC
  Administered 2019-02-15: 500 mL via INTRAVENOUS

## 2019-02-15 MED ORDER — ALBUTEROL SULFATE (2.5 MG/3ML) 0.083% IN NEBU: 5.0000 mg | INHALATION_SOLUTION | Freq: Once | RESPIRATORY_TRACT | Status: DC

## 2019-02-15 MED ORDER — POTASSIUM CHLORIDE CRYS ER 20 MEQ PO TBCR: 20.0000 meq | EXTENDED_RELEASE_TABLET | Freq: Two times a day (BID) | ORAL | 1 refills | Status: AC

## 2019-02-15 MED ORDER — SODIUM CHLORIDE (PF) 0.9 % IJ SOLN
INTRAMUSCULAR | Status: AC
  Filled 2019-02-15: qty 50

## 2019-02-15 MED ORDER — IOHEXOL 300 MG/ML  SOLN
75.0000 mL | Freq: Once | INTRAMUSCULAR | Status: AC | PRN
  Administered 2019-02-15: 75 mL via INTRAVENOUS

## 2019-02-15 MED ORDER — ALBUTEROL (5 MG/ML) CONTINUOUS INHALATION SOLN
10.0000 mg/h | INHALATION_SOLUTION | Freq: Once | RESPIRATORY_TRACT | Status: AC
  Administered 2019-02-15: 10 mg/h via RESPIRATORY_TRACT
  Filled 2019-02-15: qty 20

## 2019-02-15 NOTE — Discharge Instructions (Addendum)
Increase your Lasix to 40 mg twice a day for 1 week after that decrease to 40 mg each day.  Start taking the potassium prescription, tomorrow morning.  See your primary care doctor for checkup in 7 to 10 days.  Have them recheck your BNP which was elevated today.  Tell them that the value today was 3575.  Return here as needed for problems.

## 2019-02-15 NOTE — ED Provider Notes (Signed)
[facility]-EMERGENCY DEPT Provider Note   CSN: [id number] Arrival date & time: 02/15/19  1404    History   Chief Complaint Chief Complaint  Patient presents with  . Cough  . Shortness of Breath    HPI [name] is a 63 y.o. male.     HPI   He presents for evaluation of cough, initially productive now not productive.  His ongoing shortness of breath with dyspnea upon exertion.  He has mild bilateral chest pain left greater than right.  He has decreased appetite for several days.  He has not had any vomiting.  He denies focal weakness or paresthesia.  He continues to smoke cigarettes.  He used his inhaler this morning without relief of his discomfort.  There are no other known modifying factors.  Past Medical History:  Diagnosis Date  . Acute myocardial infarction of other inferior wall, initial episode of care   . Alcohol abuse   . Anxiety   . Arthritis   . Barrett's esophagus   . Bipolar 2 disorder (HCC)   . CAD (coronary artery disease)   . CHF (congestive heart failure) (HCC)   . Coronary artery disease   . Depression   . Diverticulosis   . GERD (gastroesophageal reflux disease)   . Gout   . Hyperlipemia   . Hypertension   . MI (myocardial infarction) (HCC)   . PAD (peripheral artery disease) (HCC)    Moderate LE atherosclerosis.  . Pneumonia   . S/P right coronary artery (RCA) stent placement 03/04/2012    Patient Active Problem List   Diagnosis Date Noted  . Atrial fibrillation (HCC)   . Non-STEMI (non-ST elevated myocardial infarction) (HCC)   . Coffee ground emesis   . Community acquired pneumonia 03/18/2018  . Nausea and vomiting 03/18/2018  . Barretts esophagus 10/02/2017  . S/P drug eluting coronary stent placement 10/02/2017  . History of colonic polyps 10/02/2017  . Acute renal failure (HCC)   . Chronic systolic CHF (congestive heart failure) (HCC)   . AKI (acute kidney injury) (HCC) 03/27/2017  . Chest pain   .  Orthostatic hypotension 03/26/2017  . S/P angioplasty with stent SVG-RCA 3/13 (left AMA without Plavix), and again SVG-RCA BMS 10/09/13 10/10/2013  . Hypertriglyceridemia 10/09/2013  . Acute myocardial infarction of other inferior wall, initial episode of care   . STEMI (ST elevation myocardial infarction) (HCC) 03/04/2012  . CAD, CABG X 27 March 2008 03/04/2012  . Bipolar 1 disorder (HCC) 03/04/2012  . Homeless 03/04/2012  . Noncompliance with diet and medication regimen 03/04/2012  . Smoker 03/04/2012  . PVD, Lt [facility]G at [facility] Oct 2012 03/04/2012  . Ischemic cardiomyopathy, EF 40% 2009 03/04/2012  . CAD - Inf STEMI in 2013 & now in 09/2013 03/04/2012  . HEPATITIS C 02/20/2007  . HYPERLIPIDEMIA 02/20/2007  . GOUT, UNSPECIFIED 02/20/2007  . TOBACCO DEPENDENCE 02/20/2007  . Hiatal hernia 02/20/2007  . GASTROINTESTINAL HEMORRHAGE 02/20/2007  . ARTHRITIS 02/20/2007    Past Surgical History:  Procedure Laterality Date  . CHOLECYSTECTOMY    . CORONARY ANGIOPLASTY    . CORONARY ARTERY BYPASS GRAFT    . CORONARY STENT PLACEMENT    . ESOPHAGOGASTRODUODENOSCOPY (EGD) WITH PROPOFOL N/A 03/22/2018   Procedure: ESOPHAGOGASTRODUODENOSCOPY (EGD) WITH PROPOFOL;  Surgeon: Rachael Fee, MD;  Location: [facility] ENDOSCOPY;  Service: Endoscopy;  Laterality: N/A;  . FEMORAL-POPLITEAL BYPASS GRAFT    . LEFT HEART CATH AND CORONARY ANGIOGRAPHY N/A 03/24/2018   Procedure: LEFT  HEART CATH AND CORONARY ANGIOGRAPHY;  Surgeon: Runell GessBerry, Jonathan J, MD;  [facility];  Service: Cardiovascular;  Laterality: N/A;  . LEFT HEART CATHETERIZATION WITH CORONARY ANGIOGRAM N/A 03/04/2012   Procedure: LEFT HEART CATHETERIZATION WITH CORONARY ANGIOGRAM;  Surgeon: Runell GessJonathan J Berry, MD;  [facility];  Service: Cardiovascular;  Laterality: N/A;  . LEFT HEART CATHETERIZATION WITH CORONARY ANGIOGRAM N/A 10/07/2013   Procedure: LEFT HEART CATHETERIZATION WITH CORONARY ANGIOGRAM;  Surgeon: Corky CraftsJayadeep S  Varanasi, MD;  [facility];  Service: Cardiovascular;  Laterality: N/A;  . TOE SURGERY          Home Medications    Prior to Admission medications   Medication Sig Start Date End Date Taking? Authorizing Provider  acetaminophen (TYLENOL) 500 MG tablet Take 500 mg by mouth every 6 (six) hours as needed.   Yes [provider]  albuterol (PROVENTIL HFA;VENTOLIN HFA) 108 (90 BASE) MCG/ACT inhaler Inhale 2 puffs into the lungs every 6 (six) hours as needed. WHEEZING   Yes [provider]  allopurinol (ZYLOPRIM) 100 MG tablet Take 450 mg by mouth daily.    Yes [provider]  apixaban (ELIQUIS) 5 MG TABS tablet Take 1 tablet (5 mg total) by mouth 2 (two) times daily. 03/26/18  Yes Ginger CarneHarden, Kyler, MD  atorvastatin (LIPITOR) 80 MG tablet Take 1 tablet (80 mg total) by mouth at bedtime. 03/26/18  Yes Ginger CarneHarden, Kyler, MD  clopidogrel (PLAVIX) 75 MG tablet Take 1 tablet (75 mg total) by mouth daily. 03/26/18  Yes Ginger CarneHarden, Kyler, MD  diphenhydrAMINE (BENADRYL) 25 MG tablet Take 50 mg by mouth daily as needed for itching or allergies.   Yes [provider]  divalproex (DEPAKOTE ER) 500 MG 24 hr tablet Take 1,000 mg by mouth at bedtime.   Yes [provider]  ezetimibe (ZETIA) 10 MG tablet Take 10 mg by mouth daily.   Yes [provider]  fenofibrate 160 MG tablet Take 1 tablet (160 mg total) by mouth daily. 03/26/18  Yes Ginger CarneHarden, Kyler, MD  furosemide (LASIX) 40 MG tablet Take 1 tablet (40 mg total) by mouth daily. 03/26/18  Yes Ginger CarneHarden, Kyler, MD  losartan (COZAAR) 25 MG tablet Take 1 tablet (25 mg total) by mouth daily. 03/26/18  Yes Ginger CarneHarden, Kyler, MD  metoprolol (TOPROL-XL) 200 MG 24 hr tablet Take 1 tablet (200 mg total) by mouth daily. Take with or immediately following a meal. 03/26/18  Yes Ginger CarneHarden, Kyler, MD  oxyCODONE (OXY IR/ROXICODONE) 5 MG immediate release tablet Take 5 mg by mouth every 6 (six) hours as needed for severe pain.   Yes [provider]  pantoprazole (PROTONIX) 40 MG tablet Take 40 mg by mouth daily.    Yes [provider]  amoxicillin-clavulanate (AUGMENTIN) 875-125 MG tablet Take 1 tablet by mouth every 12 (twelve) hours. Patient not taking: Reported on 02/15/2019 03/26/18   Ginger CarneHarden, Kyler, MD  diclofenac sodium (VOLTAREN) 1 % GEL Apply 2 g topically 4 (four) times daily. Patient not taking: Reported on 02/15/2019 03/25/18   John Giovanniathore, Vasundhra, MD  nitroGLYCERIN (NITROSTAT) 0.4 MG SL tablet Place 0.4 mg under the tongue every 5 (five) minutes as needed for chest pain. Repeat every 5 minutes up to 3 doses.  If no relief seek medical help (Do not take Cialis, Viagra or Levitra while you are on this medication)    [provider]  potassium chloride SA (K-DUR,KLOR-CON) 20 MEQ tablet Take 1 tablet (20 mEq total) by mouth 2 (  two) times daily. 02/15/19   Mancel Bale, MD    Family History Family History  Problem Relation Age of Onset  . Heart disease Mother   . Heart disease Father   . Colon cancer Paternal Grandfather     Social History Social History   Tobacco Use  . Smoking status: Current Every Day Smoker    Packs/day: 0.50    Types: Cigarettes  . Smokeless tobacco: Never Used  . Tobacco comment: Uses Nicotene patch  Substance Use Topics  . Alcohol use: No    Comment: former drinker stopped 6 years ago   . Drug use: No    Comment: Former Marijuana user of 4 years     Allergies   Prednisone and Lisinopril   Review of Systems Review of Systems  All other systems reviewed and are negative.    Physical Exam Updated Vital Signs BP (!) 140/94 (BP Location: Right Arm)   Pulse 100   Temp 97.6 F (36.4 C) (Oral)   Resp 19   SpO2 99%   Physical Exam Vitals signs and nursing note reviewed.  Constitutional:      General: He is not in acute distress.    Appearance: He is well-developed. He is not ill-appearing, toxic-appearing or diaphoretic.  HENT:     Head: Normocephalic  and atraumatic.     Right Ear: External ear normal.     Left Ear: External ear normal.  Eyes:     Conjunctiva/sclera: Conjunctivae normal.     Pupils: Pupils are equal, round, and reactive to light.  Neck:     Musculoskeletal: Normal range of motion and neck supple.     Trachea: Phonation normal.  Cardiovascular:     Rate and Rhythm: Normal rate and regular rhythm.     Heart sounds: Normal heart sounds.     Comments: No jugular venous distention. Pulmonary:     Effort: Pulmonary effort is normal. No respiratory distress.     Breath sounds: No stridor. No rales.     Comments: Rhonchi right base with few scattered wheezes. Chest:     Chest wall: No tenderness.  Abdominal:     General: There is no distension.     Palpations: Abdomen is soft.     Tenderness: There is no abdominal tenderness.  Musculoskeletal: Normal range of motion.        General: No swelling or tenderness.  Skin:    General: Skin is warm and dry.  Neurological:     Mental Status: He is alert and oriented to person, place, and time.     Cranial Nerves: No cranial nerve deficit.     Sensory: No sensory deficit.     Motor: No abnormal muscle tone.     Coordination: Coordination normal.  Psychiatric:        Behavior: Behavior normal.        Thought Content: Thought content normal.        Judgment: Judgment normal.      ED Treatments / Results  Labs (all labs ordered are listed, but only abnormal results are displayed) Labs Reviewed  BRAIN NATRIURETIC PEPTIDE - Abnormal; Notable for the following components:      Result Value   B Natriuretic Peptide 3,575.7 (*)    All other components within normal limits  COMPREHENSIVE METABOLIC PANEL - Abnormal; Notable for the following components:   Glucose, Bld 104 (*)    Calcium 8.8 (*)    AST 10 (*)    All  other components within normal limits  TROPONIN I - Abnormal; Notable for the following components:   Troponin I 0.03 (*)    All other components within  normal limits  CBC WITH DIFFERENTIAL/PLATELET - Abnormal; Notable for the following components:   RBC 3.92 (*)    Hemoglobin 10.5 (*)    HCT 35.0 (*)    RDW 15.8 (*)    All other components within normal limits  LACTIC ACID, PLASMA    EKG EKG Interpretation  Date/Time:  Sunday February 15 2019 14:29:30 EST Ventricular Rate:  76 PR Interval:    QRS Duration: 120 QT Interval:  420 QTC Calculation: 473 R Axis:   101 Text Interpretation:  Sinus rhythm Multiform ventricular premature complexes Nonspecific intraventricular conduction delay Abnrm T, consider ischemia, anterolateral lds Baseline wander in lead(s) V1 Since last tracing Nonspecific T wave abnormality now evident in V2 Confirmed by Mancel Bale [phone]) on 02/15/2019 4:51:32 PM   Radiology Dg Chest 2 View  Result Date: 02/15/2019 CLINICAL DATA:  Cough and shortness of breath. EXAM: CHEST - 2 VIEW COMPARISON:  03/22/2018 FINDINGS: Postsurgical changes from CABG. Enlarged cardiac silhouette. Moderate right pleural effusion. Probable small left pleural effusion. Bilateral lower lobe streaky peribronchial airspace opacities. Osseous structures are without acute abnormality. Soft tissues are grossly normal. IMPRESSION: 1. Enlarged cardiac silhouette. 2. Bilateral pleural effusions, moderate on the right and small on the left. 3. Bilateral lower lobe streaky peribronchial airspace opacities, which may represent asymmetric pulmonary edema versus infectious consolidation. Electronically Signed   By: Ted Mcalpine M.D.   On: 02/15/2019 15:16   Ct Chest W Contrast  Result Date: 02/15/2019 CLINICAL DATA:  Chronic shortness of breath, smoker. EXAM: CT CHEST WITH CONTRAST TECHNIQUE: Multidetector CT imaging of the chest was performed during intravenous contrast administration. CONTRAST:  85mL OMNIPAQUE IOHEXOL 300 MG/ML  SOLN COMPARISON:  Chest x-ray earlier today. FINDINGS: Cardiovascular: Prior CABG. Cardiomegaly. Aorta is normal  caliber with scattered calcifications. Mediastinum/Nodes: Mediastinal adenopathy. Right paratracheal lymph node has a short axis diameter of 2.2 cm. Bilateral hilar adenopathy, right greater than left with right hilar lymph node short axis diameter 2.1 cm. No axillary adenopathy. Lungs/Pleura: Moderate bilateral pleural effusions. Airspace opacities in both lower lobes likely reflect atelectasis. There is interlobular septal thickening and ground-glass opacities within the lungs, favor edema. Upper Abdomen: Imaging into the upper abdomen shows no acute findings. Musculoskeletal: Chest wall soft tissues are unremarkable. No acute bony abnormality. IMPRESSION: Cardiomegaly. Moderate bilateral pleural effusions with ground-glass opacities and interstitial thickening, favor edema/CHF. Bibasilar atelectasis. Mediastinal and bilateral hilar adenopathy. This may be reactive. Recommend follow-up chest CT in 3-6 months after acute symptoms resolve. Aortic Atherosclerosis (ICD10-I70.0). Electronically Signed   By: Charlett Nose M.D.   On: 02/15/2019 19:22    Procedures Procedures (including critical care time)  Medications Ordered in ED Medications  albuterol (PROVENTIL) (2.5 MG/3ML) 0.083% nebulizer solution 5 mg (5 mg Nebulization Not Given 02/15/19 1722)  sodium chloride (PF) 0.9 % injection (has no administration in time range)  albuterol (PROVENTIL,VENTOLIN) solution continuous neb (10 mg/hr Nebulization Given 02/15/19 1726)  sodium chloride 0.9 % bolus 500 mL (0 mLs Intravenous Stopped 02/15/19 1908)  iohexol (OMNIPAQUE) 300 MG/ML solution 75 mL (75 mLs Intravenous Contrast Given 02/15/19 1847)  furosemide (LASIX) injection 40 mg (40 mg Intravenous Given 02/15/19 2037)     Initial Impression / Assessment and Plan / ED Course  I have reviewed the triage vital signs and the nursing notes.  Pertinent labs &  imaging results that were available during my care of the patient were reviewed by me and considered in  my medical decision making (see chart for details).  Clinical Course as of Feb 15 2050  Sun Feb 15, 2019  1647 Abnormal, effusions right greater than left, with a bronchial streaky infiltrates, bilaterally right greater than left.  Edges reviewed by me.  DG Chest 2 View [EW]  1842 Borderline elevated  Troponin I - Once(!!) [EW]  1843 Normal  Lactic acid, plasma [EW]  1843 Normal except Hb low  CBC with Differential(!) [EW]  1843 Normal except glucose high, calcium low, AST low  Comprehensive metabolic panel(!) [EW]  2022 Elevated  Brain natriuretic peptide(!) [EW]  2050 Elevated  Brain natriuretic peptide(!) [EW]  2050 Consistent with CHF, images reviewed by me.  CT Chest W Contrast [EW]    Clinical Course User Index [EW] Mancel Bale, MD        Patient Vitals for the past 24 hrs:  BP Temp Temp src Pulse Resp SpO2  02/15/19 2041 (!) 140/94 97.6 F (36.4 C) Oral 100 19 99 %  02/15/19 2000 134/87 - - 98 - 96 %  02/15/19 1836 (!) 160/97 - - (!) 109 18 98 %  02/15/19 1830 (!) 160/97 - - (!) 109 - 98 %  02/15/19 1800 (!) 146/99 - - (!) 104 - 100 %  02/15/19 1754 (!) 133/94 - - 69 18 98 %  02/15/19 1726 - - - - - 100 %  02/15/19 1630 138/88 - - 88 18 98 %  02/15/19 1409 (!) 138/99 (!) 97.4 F (36.3 C) Oral 86 18 100 %    8:24 PM Reevaluation with update and discussion. After initial assessment and treatment, an updated evaluation reveals he is comfortable and has no further complaints.  Findings discussed and questions answered.Mancel Bale   Medical Decision Making: Shortness of breath with cough.  Some sputum production but it is improving.  Chest x-ray is nonspecific with possible pneumonia versus edema.  Patient has a history of congestive heart failure with an EF of 20%, on echo, March 2018.  He does not appear volume overloaded.  No significant peripheral edema or jugular venous distention.  Review of prior chest x-ray show recurrent pneumonia, various locations, no  recent effusions.  CT ordered to define radiologic abnormality.  CRITICAL CARE- no Performed by: Mancel Bale  Nursing Notes Reviewed/ Care Coordinated Applicable Imaging Reviewed Interpretation of Laboratory Data incorporated into ED treatment  The patient appears reasonably screened and/or stabilized for discharge and I doubt any other medical condition or other [facility] requiring further screening, evaluation, or treatment in the ED at this time prior to discharge.  Plan: Home Medications-increase Lasix to 40 mg twice a day for 1 week then 40 mg daily.  Start potassium prescription tomorrow morning, continue other medications as prescribed; Home Treatments-low-sodium diet; return here if the recommended treatment, does not improve the symptoms; Recommended follow up-PCP follow-up 7 to 10 days.    Final Clinical Impressions(s) / ED Diagnoses   Final diagnoses:  Acute on chronic congestive heart failure, unspecified heart failure type [facility])    ED Discharge Orders         Ordered    potassium chloride SA (K-DUR,KLOR-CON) 20 MEQ tablet  2 times daily     02/15/19 2048           Mancel Bale, MD 02/15/19 2341

## 2019-02-15 NOTE — ED Triage Notes (Signed)
Pt c/o cough and shortness of breath x 5 days. Pt states he has been using neb treatment at home and had minimal relief at first, but now no relief, has also taken OTC allergy meds without relief. Pt with decreased lung sounds in bases.

## 2019-02-15 NOTE — ED Notes (Signed)
Date and time results received: 02/15/19 1832 (use smartphrase ".now" to insert current time)  Test: troponin Critical Value: 0.03  Name of Provider Notified: Dr Gray Bernhardt  Orders Received? Or Actions Taken?: Actions Taken: report troponin 0.03 to Dr Mancel Bale

## 2019-06-22 ENCOUNTER — Encounter (HOSPITAL_COMMUNITY): Payer: Self-pay | Admitting: Emergency Medicine

## 2019-06-22 ENCOUNTER — Emergency Department (HOSPITAL_COMMUNITY): Payer: No Typology Code available for payment source

## 2019-06-22 ENCOUNTER — Other Ambulatory Visit: Payer: Self-pay

## 2019-06-22 ENCOUNTER — Emergency Department (HOSPITAL_COMMUNITY)
Admission: EM | Admit: 2019-06-22 | Discharge: 2019-06-22 | Disposition: A | Discharge status: Home / Self Care | Payer: No Typology Code available for payment source | Attending: Emergency Medicine | Admitting: Emergency Medicine

## 2019-06-22 DIAGNOSIS — Z951 Presence of aortocoronary bypass graft: Secondary | ICD-10-CM | POA: Insufficient documentation

## 2019-06-22 DIAGNOSIS — I110 Hypertensive heart disease with heart failure: Secondary | ICD-10-CM | POA: Diagnosis not present

## 2019-06-22 DIAGNOSIS — I5020 Unspecified systolic (congestive) heart failure: Secondary | ICD-10-CM | POA: Diagnosis not present

## 2019-06-22 DIAGNOSIS — R0602 Shortness of breath: Secondary | ICD-10-CM | POA: Diagnosis present

## 2019-06-22 DIAGNOSIS — F17210 Nicotine dependence, cigarettes, uncomplicated: Secondary | ICD-10-CM | POA: Diagnosis not present

## 2019-06-22 DIAGNOSIS — Z79899 Other long term (current) drug therapy: Secondary | ICD-10-CM | POA: Insufficient documentation

## 2019-06-22 DIAGNOSIS — I2510 Atherosclerotic heart disease of native coronary artery without angina pectoris: Secondary | ICD-10-CM | POA: Diagnosis not present

## 2019-06-22 DIAGNOSIS — Z7901 Long term (current) use of anticoagulants: Secondary | ICD-10-CM | POA: Insufficient documentation

## 2019-06-22 LAB — COMPREHENSIVE METABOLIC PANEL
ALT: 15 U/L (ref 0–44)
AST: 17 U/L (ref 15–41)
Albumin: 3.8 g/dL (ref 3.5–5.0)
Alkaline Phosphatase: 57 U/L (ref 38–126)
Anion gap: 11 (ref 5–15)
BUN: 17 mg/dL (ref 8–23)
CO2: 25 mmol/L (ref 22–32)
Calcium: 8.5 mg/dL — ABNORMAL LOW (ref 8.9–10.3)
Chloride: 98 mmol/L (ref 98–111)
Creatinine, Ser: 1.08 mg/dL (ref 0.61–1.24)
GFR calc Af Amer: >60 mL/min (ref >60)
GFR calc non Af Amer: >60 mL/min (ref >60)
Glucose, Bld: 110 mg/dL — ABNORMAL HIGH (ref 70–99)
Potassium: 3.3 mmol/L — ABNORMAL LOW (ref 3.5–5.1)
Sodium: 134 mmol/L — ABNORMAL LOW (ref 135–145)
Total Bilirubin: 0.6 mg/dL (ref 0.3–1.2)
Total Protein: 6.8 g/dL (ref 6.5–8.1)

## 2019-06-22 LAB — CBC WITH DIFFERENTIAL/PLATELET
Abs Immature Granulocytes: 0.2 10*3/uL — ABNORMAL HIGH (ref 0.00–0.07)
Basophils Absolute: 0 10*3/uL (ref 0.0–0.1)
Basophils Relative: 0 %
Eosinophils Absolute: 0.4 10*3/uL (ref 0.0–0.5)
Eosinophils Relative: 5 %
HCT: 23.3 % — ABNORMAL LOW (ref 39.0–52.0)
Hemoglobin: 7.5 g/dL — ABNORMAL LOW (ref 13.0–17.0)
Immature Granulocytes: 3 %
Lymphocytes Relative: 19 %
Lymphs Abs: 1.4 10*3/uL (ref 0.7–4.0)
MCH: 29.8 pg (ref 26.0–34.0)
MCHC: 32.2 g/dL (ref 30.0–36.0)
MCV: 92.5 fL (ref 80.0–100.0)
Monocytes Absolute: 0.9 10*3/uL (ref 0.1–1.0)
Monocytes Relative: 11 %
Neutro Abs: 4.8 10*3/uL (ref 1.7–7.7)
Neutrophils Relative %: 62 %
Platelets: 221 10*3/uL (ref 150–400)
RBC: 2.52 MIL/uL — ABNORMAL LOW (ref 4.22–5.81)
RDW: 15.7 % — ABNORMAL HIGH (ref 11.5–15.5)
WBC: 7.8 10*3/uL (ref 4.0–10.5)
nRBC: 0.4 % — ABNORMAL HIGH (ref 0.0–0.2)

## 2019-06-22 LAB — BRAIN NATRIURETIC PEPTIDE: B Natriuretic Peptide: 683.9 pg/mL — ABNORMAL HIGH (ref 0.0–100.0)

## 2019-06-22 MED ORDER — ALBUTEROL SULFATE HFA 108 (90 BASE) MCG/ACT IN AERS
2.0000 | INHALATION_SPRAY | RESPIRATORY_TRACT | Status: DC | PRN
  Administered 2019-06-22: 2 via RESPIRATORY_TRACT
  Filled 2019-06-22: qty 6.7

## 2019-06-22 NOTE — ED Provider Notes (Signed)
[facility] DEPT Provider Note   CSN: [id number] Arrival date & time: 06/22/19  1821     History   Chief Complaint Chief Complaint  Patient presents with  . Shortness of Breath    HPI [name] is a 64 y.o. male.     Patient complains of some shortness of breath.  Patient has a history of heart failure and having more swelling in his ankles  The history is provided by the spouse. No language interpreter was used.  Shortness of Breath Severity:  Mild Onset quality:  Sudden Timing:  Constant Progression:  Worsening Chronicity:  New Context: not activity   Relieved by:  Nothing Worsened by:  Nothing Ineffective treatments:  None tried Associated symptoms: no abdominal pain, no chest pain, no cough, no headaches and no rash     Past Medical History:  Diagnosis Date  . Acute myocardial infarction of other inferior wall, initial episode of care   . Alcohol abuse   . Anxiety   . Arthritis   . Barrett's esophagus   . Bipolar 2 disorder ([place])   . CAD (coronary artery disease)   . CHF (congestive heart failure) ([place])   . Coronary artery disease   . Depression   . Diverticulosis   . GERD (gastroesophageal reflux disease)   . Gout   . Hyperlipemia   . Hypertension   . MI (myocardial infarction) ([place])   . PAD (peripheral artery disease) ([facility])    Moderate LE atherosclerosis.  . Pneumonia   . S/P right coronary artery (RCA) stent placement 03/04/2012    Patient Active Problem List   Diagnosis Date Noted  . Atrial fibrillation ([place])   . Non-STEMI (non-ST elevated myocardial infarction) ([place])   . Coffee ground emesis   . Community acquired pneumonia 03/18/2018  . Nausea and vomiting 03/18/2018  . Barretts esophagus 10/02/2017  . S/P drug eluting coronary stent placement 10/02/2017  . History of colonic polyps 10/02/2017  . Acute renal failure ([place])   . Chronic systolic CHF (congestive heart failure) ([place])   . AKI (acute kidney  injury) ([place]) 03/27/2017  . Chest pain   . Orthostatic hypotension 03/26/2017  . S/P angioplasty with stent SVG-RCA 3/13 (left AMA without Plavix), and again SVG-RCA BMS 10/09/13 10/10/2013  . Hypertriglyceridemia 10/09/2013  . Acute myocardial infarction of other inferior wall, initial episode of care   . STEMI (ST elevation myocardial infarction) ([place]) 03/04/2012  . CAD, CABG X 27 March 2008 03/04/2012  . Bipolar 1 disorder ([place]) 03/04/2012  . Homeless 03/04/2012  . Noncompliance with diet and medication regimen 03/04/2012  . Smoker 03/04/2012  . PVD, [facility]G at [facility] Oct 2012 03/04/2012  . Ischemic cardiomyopathy, EF 40% 2009 03/04/2012  . CAD - Inf STEMI in 2013 & now in 09/2013 03/04/2012  . HEPATITIS C 02/20/2007  . HYPERLIPIDEMIA 02/20/2007  . GOUT, UNSPECIFIED 02/20/2007  . TOBACCO DEPENDENCE 02/20/2007  . Hiatal hernia 02/20/2007  . GASTROINTESTINAL HEMORRHAGE 02/20/2007  . ARTHRITIS 02/20/2007    Past Surgical History:  Procedure Laterality Date  . CHOLECYSTECTOMY    . CORONARY ANGIOPLASTY    . CORONARY ARTERY BYPASS GRAFT    . CORONARY STENT PLACEMENT    . ESOPHAGOGASTRODUODENOSCOPY (EGD) WITH PROPOFOL N/A 03/22/2018   Procedure: ESOPHAGOGASTRODUODENOSCOPY (EGD) WITH PROPOFOL;  Surgeon: Milus Banister, MD;  Location: [facility] ENDOSCOPY;  Service: Endoscopy;  Laterality: N/A;  . FEMORAL-POPLITEAL BYPASS GRAFT    . LEFT HEART CATH AND CORONARY ANGIOGRAPHY  N/A 03/24/2018   Procedure: LEFT HEART CATH AND CORONARY ANGIOGRAPHY;  Surgeon: Runell GessBerry, Jonathan J, MD;  Location: [facility] INVASIVE CV LAB;  Service: Cardiovascular;  Laterality: N/A;  . LEFT HEART CATHETERIZATION WITH CORONARY ANGIOGRAM N/A 03/04/2012   Procedure: LEFT HEART CATHETERIZATION WITH CORONARY ANGIOGRAM;  Surgeon: Runell GessJonathan J Berry, MD;  [facility];  Service: Cardiovascular;  Laterality: N/A;  . LEFT HEART CATHETERIZATION WITH CORONARY ANGIOGRAM N/A 10/07/2013   Procedure: LEFT HEART CATHETERIZATION WITH  CORONARY ANGIOGRAM;  Surgeon: Corky CraftsJayadeep S Varanasi, MD;  [facility];  Service: Cardiovascular;  Laterality: N/A;  . TOE SURGERY          Home Medications    Prior to Admission medications   Medication Sig Start Date End Date Taking? Authorizing Provider  acetaminophen (TYLENOL) 500 MG tablet Take 500 mg by mouth every 6 (six) hours as needed for mild pain.    Yes [provider]  albuterol (PROVENTIL HFA;VENTOLIN HFA) 108 (90 BASE) MCG/ACT inhaler Inhale 2 puffs into the lungs every 6 (six) hours as needed for wheezing or shortness of breath.    Yes [provider]  allopurinol (ZYLOPRIM) 100 MG tablet Take 450 mg by mouth daily.    Yes [provider]  apixaban (ELIQUIS) 5 MG TABS tablet Take 1 tablet (5 mg total) by mouth 2 (two) times daily. 03/26/18  Yes Ginger CarneHarden, Kyler, MD  atorvastatin (LIPITOR) 80 MG tablet Take 1 tablet (80 mg total) by mouth at bedtime. 03/26/18  Yes Ginger CarneHarden, Kyler, MD  diphenhydrAMINE (BENADRYL) 25 MG tablet Take 50 mg by mouth daily as needed for itching or allergies.   Yes [provider]  divalproex (DEPAKOTE ER) 500 MG 24 hr tablet Take 1,000 mg by mouth at bedtime.   Yes [provider]  ezetimibe (ZETIA) 10 MG tablet Take 10 mg by mouth daily.   Yes [provider]  fenofibrate 160 MG tablet Take 1 tablet (160 mg total) by mouth daily. 03/26/18  Yes Ginger CarneHarden, Kyler, MD  furosemide (LASIX) 40 MG tablet Take 1 tablet (40 mg total) by mouth daily. 03/26/18  Yes Ginger CarneHarden, Kyler, MD  losartan (COZAAR) 25 MG tablet Take 1 tablet (25 mg total) by mouth daily. 03/26/18  Yes Ginger CarneHarden, Kyler, MD  metoprolol (TOPROL-XL) 200 MG 24 hr tablet Take 1 tablet (200 mg total) by mouth daily. Take with or immediately following a meal. Patient taking differently: Take 100 mg by mouth daily. Take with or immediately following a meal. 03/26/18  Yes Ginger CarneHarden, Kyler, MD  nitroGLYCERIN (NITROSTAT) 0.4 MG SL tablet Place 0.4 mg under the tongue  every 5 (five) minutes as needed for chest pain. Repeat every 5 minutes up to 3 doses.  If no relief seek medical help (Do not take Cialis, Viagra or Levitra while you are on this medication)   Yes [provider]  oxyCODONE (OXY IR/ROXICODONE) 5 MG immediate release tablet Take 5-10 mg by mouth every 6 (six) hours as needed for moderate pain.    Yes [provider]  pantoprazole (PROTONIX) 40 MG tablet Take 40 mg by mouth daily.    Yes [provider]  sertraline (ZOLOFT) 50 MG tablet Take 50 mg by mouth daily.   Yes [provider]  amoxicillin-clavulanate (AUGMENTIN) 875-125 MG tablet Take 1 tablet by mouth every 12 (twelve) hours. Patient not taking: Reported on 02/15/2019 03/26/18   Ginger CarneHarden, Kyler, MD  clopidogrel (PLAVIX) 75 MG tablet Take 1 tablet (75 mg total) by mouth  daily. Patient not taking: Reported on 06/22/2019 03/26/18   Ginger CarneHarden, Kyler, MD  diclofenac sodium (VOLTAREN) 1 % GEL Apply 2 g topically 4 (four) times daily. Patient not taking: Reported on 02/15/2019 03/25/18   John Giovanniathore, Vasundhra, MD  potassium chloride SA (K-DUR,KLOR-CON) 20 MEQ tablet Take 1 tablet (20 mEq total) by mouth 2 (two) times daily. Patient not taking: Reported on 06/22/2019 02/15/19   Mancel BaleWentz, Elliott, MD    Family History Family History  Problem Relation Age of Onset  . Heart disease Mother   . Heart disease Father   . Colon cancer Paternal Grandfather     Social History Social History   Tobacco Use  . Smoking status: Current Every Day Smoker    Packs/day: 0.50    Types: Cigarettes  . Smokeless tobacco: Never Used  . Tobacco comment: Uses Nicotene patch  Substance Use Topics  . Alcohol use: No    Comment: former drinker stopped 6 years ago   . Drug use: No    Comment: Former Marijuana user of 4 years     Allergies   Prednisone and Lisinopril   Review of Systems Review of Systems  Constitutional: Negative for appetite change and fatigue.  HENT: Negative for  congestion, ear discharge and sinus pressure.   Eyes: Negative for discharge.  Respiratory: Positive for shortness of breath. Negative for cough.   Cardiovascular: Negative for chest pain.  Gastrointestinal: Negative for abdominal pain and diarrhea.  Genitourinary: Negative for frequency and hematuria.  Musculoskeletal: Negative for back pain.  Skin: Negative for rash.  Neurological: Negative for seizures and headaches.  Psychiatric/Behavioral: Negative for hallucinations.     Physical Exam Updated Vital Signs BP 113/74   Pulse 75   Temp 98.2 F (36.8 C) (Oral)   Resp 16   SpO2 99%   Physical Exam Vitals signs and nursing note reviewed.  Constitutional:      Appearance: He is well-developed.  HENT:     Head: Normocephalic.     Nose: Nose normal.  Eyes:     General: No scleral icterus.    Conjunctiva/sclera: Conjunctivae normal.  Neck:     Musculoskeletal: Neck supple.     Thyroid: No thyromegaly.  Cardiovascular:     Rate and Rhythm: Normal rate and regular rhythm.     Heart sounds: No murmur. No friction rub. No gallop.   Pulmonary:     Breath sounds: No stridor. No wheezing or rales.  Chest:     Chest wall: No tenderness.  Abdominal:     General: There is no distension.     Tenderness: There is no abdominal tenderness. There is no rebound.  Musculoskeletal: Normal range of motion.     Left lower leg: Edema present.  Lymphadenopathy:     Cervical: No cervical adenopathy.  Skin:    Findings: No erythema or rash.  Neurological:     Mental Status: He is oriented to person, place, and time.     Motor: No abnormal muscle tone.     Coordination: Coordination normal.  Psychiatric:        Behavior: Behavior normal.      ED Treatments / Results  Labs (all labs ordered are listed, but only abnormal results are displayed) Labs Reviewed  CBC WITH DIFFERENTIAL/PLATELET - Abnormal; Notable for the following components:      Result Value   RBC 2.52 (*)     Hemoglobin 7.5 (*)    HCT 23.3 (*)    RDW 15.7 (*)  nRBC 0.4 (*)    Abs Immature Granulocytes 0.20 (*)    All other components within normal limits  COMPREHENSIVE METABOLIC PANEL - Abnormal; Notable for the following components:   Sodium 134 (*)    Potassium 3.3 (*)    Glucose, Bld 110 (*)    Calcium 8.5 (*)    All other components within normal limits  BRAIN NATRIURETIC PEPTIDE - Abnormal; Notable for the following components:   B Natriuretic Peptide 683.9 (*)    All other components within normal limits    EKG    Radiology Dg Chest 2 View  Result Date: 06/22/2019 CLINICAL DATA:  Shortness of breath EXAM: CHEST - 2 VIEW COMPARISON:  February 15, 2019 FINDINGS: The heart size is enlarged. The patient is status post prior median sternotomy. There are coarsened interstitial lung markings bilaterally. There may be trace bilateral pleural effusions. There is generalized volume overload without overt pulmonary edema. There is no pneumothorax. There is no acute osseous abnormality. IMPRESSION: 1. Cardiomegaly with mild volume overload. 2. Chronic lung markings bilaterally favored to represent atelectasis or scarring. Electronically Signed   By: Katherine Mantlehristopher  Green M.D.   On: 06/22/2019 19:02    Procedures Procedures (including critical care time)  Medications Ordered in ED Medications  albuterol (VENTOLIN HFA) 108 (90 Base) MCG/ACT inhaler 2 puff (2 puffs Inhalation Given 06/22/19 2053)     Initial Impression / Assessment and Plan / ED Course  I have reviewed the triage vital signs and the nursing notes.  Pertinent labs & imaging results that were available during my care of the patient were reviewed by me and considered in my medical decision making (see chart for details).        Labs unremarkable.  Patient with increased fluid in his legs and vascular changes on chest x-ray consistent with congestive heart failure.  He will continue take Lasix 40 mg twice a day and follow-up  with his PCP Final Clinical Impressions(s) / ED Diagnoses   Final diagnoses:  Systolic congestive heart failure, unspecified HF chronicity [facility](HCC)    ED Discharge Orders    None       Bethann BerkshireZammit, Savera Donson, MD 06/22/19 2227

## 2019-06-22 NOTE — ED Triage Notes (Signed)
Pt c/o SOB that been ongoing since Thursday. Was tested for COvid earlier and was negative. [facility] told by [facility] to [facility]. Pt has cough for week.

## 2019-06-22 NOTE — Discharge Instructions (Addendum)
Continue taking the Lasix twice a day and follow-up with your doctor next week or sooner

## 2020-03-24 DEATH — deceased
